# Patient Record
Sex: Female | Born: 1955
Health system: Southern US, Community
[De-identification: ages and names within clinical notes are randomized; demographics above are authoritative.]

## PROBLEM LIST (undated history)

## (undated) DIAGNOSIS — G4730 Sleep apnea, unspecified: Secondary | ICD-10-CM

## (undated) DIAGNOSIS — N189 Chronic kidney disease, unspecified: Secondary | ICD-10-CM

## (undated) DIAGNOSIS — G709 Myoneural disorder, unspecified: Secondary | ICD-10-CM

## (undated) DIAGNOSIS — T7840XA Allergy, unspecified, initial encounter: Secondary | ICD-10-CM

## (undated) DIAGNOSIS — J309 Allergic rhinitis, unspecified: Secondary | ICD-10-CM

## (undated) DIAGNOSIS — I10 Essential (primary) hypertension: Secondary | ICD-10-CM

## (undated) DIAGNOSIS — F419 Anxiety disorder, unspecified: Secondary | ICD-10-CM

## (undated) DIAGNOSIS — F32A Depression, unspecified: Secondary | ICD-10-CM

## (undated) DIAGNOSIS — L409 Psoriasis, unspecified: Secondary | ICD-10-CM

## (undated) DIAGNOSIS — M1990 Unspecified osteoarthritis, unspecified site: Secondary | ICD-10-CM

## (undated) DIAGNOSIS — E119 Type 2 diabetes mellitus without complications: Secondary | ICD-10-CM

## (undated) DIAGNOSIS — E559 Vitamin D deficiency, unspecified: Secondary | ICD-10-CM

## (undated) DIAGNOSIS — E785 Hyperlipidemia, unspecified: Secondary | ICD-10-CM

## (undated) HISTORY — DX: Essential (primary) hypertension: I10

## (undated) HISTORY — DX: Allergic rhinitis, unspecified: J30.9

## (undated) HISTORY — DX: Psoriasis, unspecified: L40.9

## (undated) HISTORY — DX: Unspecified osteoarthritis, unspecified site: M19.90

## (undated) HISTORY — DX: Allergy, unspecified, initial encounter: T78.40XA

## (undated) HISTORY — DX: Anxiety disorder, unspecified: F41.9

## (undated) HISTORY — DX: Type 2 diabetes mellitus without complications: E11.9

## (undated) HISTORY — PX: TONSILLECTOMY: SUR1361

## (undated) HISTORY — DX: Hyperlipidemia, unspecified: E78.5

## (undated) HISTORY — DX: Vitamin D deficiency, unspecified: E55.9

## (undated) HISTORY — DX: Depression, unspecified: F32.A

## (undated) HISTORY — DX: Chronic kidney disease, unspecified: N18.9

## (undated) HISTORY — DX: Myoneural disorder, unspecified: G70.9

## (undated) HISTORY — DX: Sleep apnea, unspecified: G47.30

## (undated) HISTORY — PX: APPENDECTOMY: SHX54

---

## 2015-12-19 DIAGNOSIS — T783XXA Angioneurotic edema, initial encounter: Secondary | ICD-10-CM | POA: Insufficient documentation

## 2015-12-19 DIAGNOSIS — G4733 Obstructive sleep apnea (adult) (pediatric): Secondary | ICD-10-CM | POA: Insufficient documentation

## 2015-12-19 DIAGNOSIS — I10 Essential (primary) hypertension: Secondary | ICD-10-CM

## 2015-12-19 DIAGNOSIS — E119 Type 2 diabetes mellitus without complications: Secondary | ICD-10-CM | POA: Insufficient documentation

## 2015-12-19 DIAGNOSIS — E1159 Type 2 diabetes mellitus with other circulatory complications: Secondary | ICD-10-CM | POA: Insufficient documentation

## 2015-12-19 DIAGNOSIS — Z9989 Dependence on other enabling machines and devices: Secondary | ICD-10-CM

## 2015-12-19 DIAGNOSIS — T464X5A Adverse effect of angiotensin-converting-enzyme inhibitors, initial encounter: Secondary | ICD-10-CM | POA: Insufficient documentation

## 2017-10-07 ENCOUNTER — Encounter: Payer: Self-pay | Admitting: Pulmonary Disease

## 2017-10-07 ENCOUNTER — Ambulatory Visit (INDEPENDENT_AMBULATORY_CARE_PROVIDER_SITE_OTHER): Payer: BLUE CROSS/BLUE SHIELD | Admitting: Pulmonary Disease

## 2017-10-07 VITALS — BP 126/72 | HR 88 | Ht 63.0 in | Wt 181.0 lb

## 2017-10-07 DIAGNOSIS — G4733 Obstructive sleep apnea (adult) (pediatric): Secondary | ICD-10-CM

## 2017-10-07 DIAGNOSIS — Z9989 Dependence on other enabling machines and devices: Secondary | ICD-10-CM | POA: Diagnosis not present

## 2017-10-07 NOTE — Progress Notes (Signed)
   Subjective:    Patient ID: Adrienne Davidson, female    DOB: 1955/07/01, 62 y.o.   MRN: [id number]  HPI    Review of Systems  Constitutional: Negative for fever and unexpected weight change.  HENT: Positive for congestion, sinus pressure and sneezing. Negative for dental problem, ear pain, nosebleeds, postnasal drip, rhinorrhea, sore throat and trouble swallowing.   Eyes: Negative for redness and itching.  Respiratory: Positive for cough and shortness of breath. Negative for wheezing.   Cardiovascular: Negative for palpitations and leg swelling.  Gastrointestinal: Negative for nausea and vomiting.  Genitourinary: Negative for dysuria.  Musculoskeletal: Negative for joint swelling.  Skin: Positive for rash.  Allergic/Immunologic: Negative.  Negative for environmental allergies, food allergies and immunocompromised state.  Neurological: Negative for headaches.  Hematological: Does not bruise/bleed easily.  Psychiatric/Behavioral: Negative for dysphoric mood. The patient is not nervous/anxious.        Objective:   Physical Exam        Assessment & Plan:

## 2017-10-07 NOTE — Progress Notes (Signed)
[facility]  Chief Complaint  Patient presents with  . sleep consult    Pt is self referral, needs to be re-established. Pt's last Sleep Study was in 2008 through [facility].  Pt is using CPAP machine, and would like to change DME to [facility]- this is were her husband is established. Pt is requesting new cpap machine, it has been over 64 yrs old.    Vital signs: BP 126/72 (BP Location: Left Arm, Cuff Size: Normal)   Pulse 88   Ht 5\' 3"  (1.6 m)   Wt 181 lb (82.1 kg)   SpO2 99%   BMI 32.06 kg/m   History of Present Illnes Adrienne Davidson is a 62 y.o. female for evaluation of sleep problems.  She had a sleep study in 2008.  Has been on same CPAP since.  Needs new machine.  Uses [facility].  Snores, and stops breathing when CPAP doesn't work.  Also very restless.  She goes to sleep at 10 pm.  She falls asleep in 20 minutes.  She wakes up several times to use the bathroom.  She gets out of bed at 7 am.  She feels tired in the morning if her CPAP doesn't work.  She denies morning headache.  She does not use anything to help her fall sleep or stay awake.  She denies sleep walking, sleep talking, bruxism, or nightmares.  There is no history of restless legs.  She denies sleep hallucinations, sleep paralysis, or cataplexy.  The Epworth score is 8 out of 24.    Physical Exam:  General - pleasant Eyes - pupils reactive, wears glasses ENT - no sinus tenderness, no oral exudate, no LAN Cardiac - regular, no murmur Chest - no wheeze, rales Abd - soft, non tender Ext - no edema Skin - no rashes Neuro - normal strength Psych - normal mood   Assessment/Plan:  Obstructive sleep apnea. - will arrange for new auto CPAP - will get copy of her old sleep study - explained insurance might require her to do new sleep study  Tobacco abuse. - encouraged her to keep up with her smoking cessation efforts   Patient Instructions  Will try  to get a copy of your sleep study from [facility]  Will arrange for new auto CPAP machine  Follow up in 2 months    [name], MD [place] 10/07/2017, 12:49 PM  Flow Sheet  Sleep tests:  Review of Systems: Constitutional: Negative for fever and unexpected weight change.  HENT: Positive for congestion, sinus pressure and sneezing. Negative for dental problem, ear pain, nosebleeds, postnasal drip, rhinorrhea, sore throat and trouble swallowing.   Eyes: Negative for redness and itching.  Respiratory: Positive for cough and shortness of breath. Negative for wheezing.   Cardiovascular: Negative for palpitations and leg swelling.  Gastrointestinal: Negative for nausea and vomiting.  Genitourinary: Negative for dysuria.  Musculoskeletal: Negative for joint swelling.  Skin: Positive for rash.  Allergic/Immunologic: Negative.  Negative for environmental allergies, food allergies and immunocompromised state.  Neurological: Negative for headaches.  Hematological: Does not bruise/bleed easily.  Psychiatric/Behavioral: Negative for dysphoric mood. The patient is not nervous/anxious.    Past Medical History: She  has a past medical history of Allergic rhinitis, Diabetes mellitus ([place]), Hyperlipidemia, Hypertension, and Sleep apnea.  Past Surgical History: She  has a past surgical history that includes Appendectomy and Tonsillectomy.  Family History: Her family history includes Heart disease in  her father.  Social History: She  reports that she has been smoking cigarettes.  She has a 25.00 pack-year smoking history. She has never used smokeless tobacco. She reports that she does not drink alcohol or use drugs.  Medications: Allergies as of 10/07/2017      Reactions   Enalapril       Medication List        Accurate as of 10/07/17 12:49 PM. Always use your most recent med list.          ASPIRIN 81 81 MG EC tablet Generic drug:  aspirin Take 81  mg by mouth daily. Swallow whole.   CENTRUM ADULTS Tabs Take by mouth.   Cranberry 250 MG Caps Take by mouth.   gemfibrozil 600 MG tablet Commonly known as:  LOPID Take 600 mg by mouth 2 (two) times daily before a meal.   Krill Oil 500 MG Caps Take by mouth.   LOSARTAN POTASSIUM PO Take by mouth.   metFORMIN 1000 MG tablet Commonly known as:  GLUCOPHAGE Take 1,000 mg by mouth 2 (two) times daily with a meal.   rosuvastatin 5 MG tablet Commonly known as:  CRESTOR Take 5 mg by mouth daily.   sitaGLIPtin 25 MG tablet Commonly known as:  JANUVIA Take 25 mg by mouth daily.

## 2017-10-07 NOTE — Patient Instructions (Signed)
Will try to get a copy of your sleep study from [facility]  Will arrange for new auto CPAP machine  Follow up in 2 months

## 2017-10-28 ENCOUNTER — Other Ambulatory Visit: Payer: Self-pay | Admitting: Pulmonary Disease

## 2017-10-28 DIAGNOSIS — G4733 Obstructive sleep apnea (adult) (pediatric): Secondary | ICD-10-CM

## 2017-10-28 DIAGNOSIS — Z9989 Dependence on other enabling machines and devices: Principal | ICD-10-CM

## 2017-12-09 ENCOUNTER — Ambulatory Visit: Payer: BLUE CROSS/BLUE SHIELD | Admitting: Pulmonary Disease

## 2017-12-20 ENCOUNTER — Ambulatory Visit (INDEPENDENT_AMBULATORY_CARE_PROVIDER_SITE_OTHER): Payer: BLUE CROSS/BLUE SHIELD | Admitting: Pulmonary Disease

## 2017-12-20 ENCOUNTER — Encounter: Payer: Self-pay | Admitting: Pulmonary Disease

## 2017-12-20 VITALS — BP 136/74 | HR 104 | Ht 63.0 in | Wt 175.0 lb

## 2017-12-20 DIAGNOSIS — Z23 Encounter for immunization: Secondary | ICD-10-CM

## 2017-12-20 DIAGNOSIS — G4733 Obstructive sleep apnea (adult) (pediatric): Secondary | ICD-10-CM | POA: Diagnosis not present

## 2017-12-20 DIAGNOSIS — Z9989 Dependence on other enabling machines and devices: Secondary | ICD-10-CM

## 2017-12-20 NOTE — Progress Notes (Signed)
[facility]  Chief Complaint  Patient presents with  . Follow-up    pt states she is wearing cpap avg 9hr nightly- feels pressure and mask is okay. DME:[facility]    Constitutional: BP 136/74 (BP Location: Left Arm, Cuff Size: Normal)   Pulse (!) 104   Ht 5\' 3"  (1.6 m)   Wt 175 lb (79.4 kg)   SpO2 98%   BMI 31.00 kg/m   History of Present Illnes Adrienne Davidson is a 62 y.o. female with obstructive sleep apnea.  She got new CPAP since last visit.  This is working well.  She is surprised how much quieter her new machine is.  No issue with mask fit or pressure.  Denies sinus congestion, sore throat, dry mouth, or aerophagia.  Feels rested during the day.  Comprehensive Respiratory Exam:  Appearance - well kempt  ENMT - nasal mucosa moist, turbinates clear, midline nasal septum, no dental lesions, no gingival bleeding, no oral exudates, no tonsillar hypertrophy Neck - no masses, trachea midline, no thyromegaly, no elevation in JVP Respiratory - normal appearance of chest wall, normal respiratory effort w/o accessory muscle use, no wheezing or rales CV - s1s2 regular rate and rhythm, no murmurs, no peripheral edema GI - soft, non tender Lymph - no adenopathy noted in neck and axillary areas MSK - normal muscle strength and tone, normal gait Ext - no cyanosis, clubbing, or joint inflammation noted Skin - no rashes, lesions, or ulcers Neuro - oriented to person, place, and time Psych - normal mood and affect   Assessment/Plan:  Obstructive sleep apnea. - she is compliant with CPAP and reports benefit from therapy - continue auto CPAP  Tobacco abuse. - reviewed options to assist with smoking cessation - flu shot today   Patient Instructions  Flu shot today  Follow up in 1 year    [name], MD [place] 12/20/2017, 12:17 PM  Flow Sheet  Sleep tests: Auto CPAP 11/19/17 to 12/18/17 >> used on 30 of 30 nights  with average 9 hrs 8 min.  Average AHI 1.1 with median CPAP 8 and 95 th percentile CPAP 12 cm H2O.  Past Medical History: She  has a past medical history of Allergic rhinitis, Diabetes mellitus ([place]), Hyperlipidemia, Hypertension, and Sleep apnea.  Past Surgical History: She  has a past surgical history that includes Appendectomy and Tonsillectomy.  Family History: Her family history includes Heart disease in her father.  Social History: She  reports that she has been smoking cigarettes. She has a 25.00 pack-year smoking history. She has never used smokeless tobacco. She reports that she does not drink alcohol or use drugs.  Medications: Allergies as of 12/20/2017      Reactions   Enalapril       Medication List        Accurate as of 12/20/17 12:17 PM. Always use your most recent med list.          ASPIRIN 81 81 MG EC tablet Generic drug:  aspirin Take 81 mg by mouth daily. Swallow whole.   CENTRUM ADULTS Tabs Take by mouth.   Cranberry 250 MG Caps Take by mouth.   gemfibrozil 600 MG tablet Commonly known as:  LOPID Take 600 mg by mouth 2 (two) times daily before a meal.   Krill Oil 500 MG Caps Take by mouth.   LOSARTAN POTASSIUM PO Take by mouth.   metFORMIN 1000 MG tablet Commonly known as:  GLUCOPHAGE  Take 1,000 mg by mouth 2 (two) times daily with a meal.   rosuvastatin 5 MG tablet Commonly known as:  CRESTOR Take 5 mg by mouth daily.   sitaGLIPtin 25 MG tablet Commonly known as:  JANUVIA Take 25 mg by mouth daily.

## 2017-12-20 NOTE — Patient Instructions (Signed)
Flu shot today Follow up in 1 year 

## 2018-03-28 ENCOUNTER — Encounter: Payer: Self-pay | Admitting: Pediatrics

## 2018-03-28 ENCOUNTER — Ambulatory Visit: Payer: BLUE CROSS/BLUE SHIELD | Admitting: Pediatrics

## 2018-03-28 VITALS — BP 110/64 | HR 84 | Temp 97.7°F | Ht 63.0 in | Wt 171.6 lb

## 2018-03-28 DIAGNOSIS — E559 Vitamin D deficiency, unspecified: Secondary | ICD-10-CM

## 2018-03-28 DIAGNOSIS — E119 Type 2 diabetes mellitus without complications: Secondary | ICD-10-CM | POA: Diagnosis not present

## 2018-03-28 DIAGNOSIS — G4733 Obstructive sleep apnea (adult) (pediatric): Secondary | ICD-10-CM

## 2018-03-28 DIAGNOSIS — E7849 Other hyperlipidemia: Secondary | ICD-10-CM

## 2018-03-28 DIAGNOSIS — I10 Essential (primary) hypertension: Secondary | ICD-10-CM | POA: Diagnosis not present

## 2018-03-28 DIAGNOSIS — E785 Hyperlipidemia, unspecified: Secondary | ICD-10-CM

## 2018-03-28 DIAGNOSIS — Z1159 Encounter for screening for other viral diseases: Secondary | ICD-10-CM | POA: Diagnosis not present

## 2018-03-28 DIAGNOSIS — E1169 Type 2 diabetes mellitus with other specified complication: Secondary | ICD-10-CM

## 2018-03-28 DIAGNOSIS — L409 Psoriasis, unspecified: Secondary | ICD-10-CM

## 2018-03-28 LAB — BAYER DCA HB A1C WAIVED: HB A1C (BAYER DCA - WAIVED): 6.5 % (ref <7.0)

## 2018-03-28 MED ORDER — METFORMIN HCL 1000 MG PO TABS: 1000.0000 mg | ORAL_TABLET | Freq: Two times a day (BID) | ORAL | 1 refills | Status: DC

## 2018-03-28 MED ORDER — GEMFIBROZIL 600 MG PO TABS: 600.0000 mg | ORAL_TABLET | Freq: Two times a day (BID) | ORAL | 1 refills | Status: DC

## 2018-03-28 MED ORDER — ROSUVASTATIN CALCIUM 5 MG PO TABS: 5.0000 mg | ORAL_TABLET | Freq: Every day | ORAL | 1 refills | Status: DC

## 2018-03-28 MED ORDER — LIRAGLUTIDE 18 MG/3ML ~~LOC~~ SOPN: 1.2000 mg | PEN_INJECTOR | Freq: Every day | SUBCUTANEOUS | 1 refills | Status: DC

## 2018-03-28 MED ORDER — AMLODIPINE BESYLATE 5 MG PO TABS: 5.0000 mg | ORAL_TABLET | Freq: Every day | ORAL | 3 refills | Status: DC

## 2018-03-28 NOTE — Patient Instructions (Signed)
Let me know if blood pressures regularly >140 on top or >90 on bottom.

## 2018-03-28 NOTE — Progress Notes (Signed)
Subjective:   Patient ID: Adrienne Davidson, female    DOB: 07-Oct-1955, 62 y.o.   MRN: [id number] CC: New Patient (Initial Visit) And follow-up multiple medical problems HPI: Adrienne Davidson is a 62 y.o. female  Here to establish care and follow-up multiple medical problems  Plaque psoriasis: on apremilast, followed by dermatology.   Diabetes: Currently on Januvia, metformin, Victoza.  Victoza was recently started.  She says diabetes has recently been fairly well controlled overall.  She is trying to avoid sugary foods.  Hyperlipidemia: Taking rosuvastatin and gemfibrozil.  Obstructive sleep apnea: Using CPAP regularly.  Hypertension: Was on an ACE inhibitor, had angioedema, was seen in the emergency room about 2 years ago.  She was switched to losartan.  Has not had any problems with the losartan yet.  Was on the ACE inhibitor first years prior to her angioedema reaction.  Does not have any other food allergies.  Vitamin D deficiency: Has been taking 5000 IU daily for the last few months.  Past Medical History:  Diagnosis Date  . Allergic rhinitis   . Diabetes mellitus ([place])   . Hyperlipidemia   . Hypertension   . Psoriasis    Bilateral feet  . Sleep apnea   . Sleep apnea   . Vitamin D deficiency    Family History  Problem Relation Age of Onset  . Heart disease Father   . Non-Hodgkin's lymphoma Mother    Social History   Socioeconomic History  . Marital status: Married    Spouse name: [name]  . Number of children: 2  . Years of education: Not on file  . Highest education level: Not on file  Occupational History  . Not on file  Social Needs  . Financial resource strain: Not on file  . Food insecurity:    Worry: Not on file    Inability: Not on file  . Transportation needs:    Medical: Not on file    Non-medical: Not on file  Tobacco Use  . Smoking status: Current Every Day Smoker    Packs/day: 0.25    Years: 25.00    Pack years: 6.25    Types: Cigarettes  .  Smokeless tobacco: Never Used  Substance and Sexual Activity  . Alcohol use: Never    Frequency: Never  . Drug use: Never  . Sexual activity: Not on file  Lifestyle  . Physical activity:    Days per week: Not on file    Minutes per session: Not on file  . Stress: Not on file  Relationships  . Social connections:    Talks on phone: Not on file    Gets together: Not on file    Attends religious service: Not on file    Active member of club or organization: Not on file    Attends meetings of clubs or organizations: Not on file    Relationship status: Not on file  Other Topics Concern  . Not on file  Social History Narrative  . Not on file   ROS: All systems negative other than what is in HPI  Objective:    BP 110/64   Pulse 84   Temp 97.7 F (36.5 C) (Oral)   Ht _0  (1.6 m)   Wt 171 lb 9.6 oz (77.8 kg)   BMI 30.40 kg/m   Wt Readings from Last 3 Encounters:  03/28/18 171 lb 9.6 oz (77.8 kg)  12/20/17 175 lb (79.4 kg)  10/07/17 181 lb (82.1 kg)  Gen: NAD, alert, cooperative with exam, NCAT EYES: EOMI, no conjunctival injection, or no icterus ENT:  TMs pearly gray b/l, OP without erythema LYMPH: no cervical LAD CV: NRRR, normal S1/S2, no murmur, distal pulses 2+ b/l Resp: CTABL, no wheezes, normal WOB Abd: +BS, soft, NTND. no guarding or organomegaly Ext: No edema, warm Neuro: Alert and oriented, strength equal b/l UE and LE, coordination grossly normal MSK: normal muscle bulk  Assessment & Plan:  Adrienne Davidson was seen today for new patient (initial visit), and follow-up multiple medical problems.  Diagnoses and all orders for this visit:  Diabetes mellitus without complication (HCC) O0B 6.5 today.  On GLP-1 agonist and DPP 4 inhibitor, will stop Januvia.  Continue Victoza, metformin. -     CMP14+EGFR -     Bayer DCA Hb A1c Waived -     liraglutide (VICTOZA) 18 MG/3ML SOPN; Inject 0.2 mLs (1.2 mg total) into the skin daily. -     metFORMIN (GLUCOPHAGE) 1000 MG  tablet; Take 1 tablet (1,000 mg total) by mouth 2 (two) times daily with a meal.  Essential hypertension Well-controlled today.  Olmesartan alone.  History of angioedema with ACE inhibitor.  Will switch to amlodipine. -     CMP14+EGFR -     amLODipine (NORVASC) 5 MG tablet; Take 1 tablet (5 mg total) by mouth daily.  OSA (obstructive sleep apnea) Stable, following with pulmonology.  Wearing CPAP regularly  Encounter for hepatitis C screening test for low risk patient -     Hepatitis C antibody  Psoriasis Well-controlled on apremilast.  Following with dermatology.  Vitamin D deficiency Continue vitamin D replacement, will check levels as she is on high-dose vitamin D. -     VITAMIN D 25 Hydroxy (Vit-D Deficiency, Fractures)  Hyperlipidemia associated with type 2 diabetes mellitus ([place]) Stable, continue current medicines. -     Lipid panel -     gemfibrozil (LOPID) 600 MG tablet; Take 1 tablet (600 mg total) by mouth 2 (two) times daily before a meal. -     rosuvastatin (CRESTOR) 5 MG tablet; Take 1 tablet (5 mg total) by mouth daily.   Follow up plan: Return in about 4 weeks (around 04/25/2018) for for well visit and BP check. Assunta Found, MD [place]

## 2018-03-29 LAB — LIPID PANEL
CHOLESTEROL TOTAL: 176 mg/dL (ref 100–199)
Chol/HDL Ratio: 3 ratio (ref 0.0–4.4)
HDL: 58 mg/dL (ref >39)
LDL Calculated: 100 mg/dL — ABNORMAL HIGH (ref 0–99)
Triglycerides: 90 mg/dL (ref 0–149)
VLDL CHOLESTEROL CAL: 18 mg/dL (ref 5–40)

## 2018-03-29 LAB — CMP14+EGFR
A/G RATIO: 2 (ref 1.2–2.2)
ALBUMIN: 4.6 g/dL (ref 3.6–4.8)
ALT: 9 IU/L (ref 0–32)
AST: 11 IU/L (ref 0–40)
Alkaline Phosphatase: 85 IU/L (ref 39–117)
BUN/Creatinine Ratio: 23 (ref 12–28)
BUN: 23 mg/dL (ref 8–27)
CALCIUM: 10.8 mg/dL — AB (ref 8.7–10.3)
CHLORIDE: 105 mmol/L (ref 96–106)
CO2: 19 mmol/L — ABNORMAL LOW (ref 20–29)
Creatinine, Ser: 1.02 mg/dL — ABNORMAL HIGH (ref 0.57–1.00)
GFR calc Af Amer: 67 mL/min/{1.73_m2} (ref >59)
GFR calc non Af Amer: 58 mL/min/{1.73_m2} — ABNORMAL LOW (ref >59)
GLUCOSE: 106 mg/dL — AB (ref 65–99)
Globulin, Total: 2.3 g/dL (ref 1.5–4.5)
Potassium: 5 mmol/L (ref 3.5–5.2)
Sodium: 144 mmol/L (ref 134–144)
TOTAL PROTEIN: 6.9 g/dL (ref 6.0–8.5)

## 2018-03-29 LAB — VITAMIN D 25 HYDROXY (VIT D DEFICIENCY, FRACTURES): Vit D, 25-Hydroxy: 65.4 ng/mL (ref 30.0–100.0)

## 2018-03-29 LAB — HEPATITIS C ANTIBODY: Hep C Virus Ab: <0.1 s/co ratio (ref 0.0–0.9)

## 2018-04-28 ENCOUNTER — Ambulatory Visit (INDEPENDENT_AMBULATORY_CARE_PROVIDER_SITE_OTHER): Payer: BLUE CROSS/BLUE SHIELD | Admitting: Pediatrics

## 2018-04-28 ENCOUNTER — Encounter: Payer: Self-pay | Admitting: Pediatrics

## 2018-04-28 VITALS — BP 131/78 | HR 90 | Temp 98.7°F | Ht 63.0 in | Wt 175.4 lb

## 2018-04-28 DIAGNOSIS — Z0000 Encounter for general adult medical examination without abnormal findings: Secondary | ICD-10-CM | POA: Diagnosis not present

## 2018-04-28 DIAGNOSIS — E119 Type 2 diabetes mellitus without complications: Secondary | ICD-10-CM

## 2018-04-28 NOTE — Progress Notes (Signed)
  Subjective:   Patient ID: Adrienne Davidson, female    DOB: 18-May-1955, 63 y.o.   MRN: [id number] CC: Annual Exam and Blood Pressure Check  HPI: Adrienne Davidson is a 63 y.o. female   Mammogram: normal mid December  Pap smear: due  Colonoscopy: Feb 2019, had 3 polyps. [facility] [place]  Diabetes: due for eye exam   Relevant past medical, surgical, family and social history reviewed. Allergies and medications reviewed and updated. Social History   Tobacco Use  Smoking Status Current Every Day Smoker  . Packs/day: 0.25  . Years: 25.00  . Pack years: 6.25  . Types: Cigarettes  Smokeless Tobacco Never Used   ROS: All systems negative other than what is in the HPI.  Objective:    BP 131/78   Pulse 90   Temp 98.7 F (37.1 C) (Oral)   Ht 5\' 3"  (1.6 m)   Wt 175 lb 6.4 oz (79.6 kg)   BMI 31.07 kg/m   Wt Readings from Last 3 Encounters:  04/28/18 175 lb 6.4 oz (79.6 kg)  03/28/18 171 lb 9.6 oz (77.8 kg)  12/20/17 175 lb (79.4 kg)    Gen: NAD, alert, cooperative with exam, NCAT EYES: EOMI, no conjunctival injection, or no icterus ENT:  TMs pearly gray b/l, OP without erythema LYMPH: no cervical LAD CV: NRRR, normal S1/S2, no murmur, distal pulses 2+ b/l Resp: CTABL, no wheezes, normal WOB Abd: +BS, soft, NTND. no guarding or organomegaly Ext: No edema, warm Neuro: Alert and oriented, strength equal b/l UE and LE, coordination grossly normal MSK: normal muscle bulk Breast: Normal exam bilaterally GU: Normal external female genitalia, normal-appearing cervix.  Assessment & Plan:  Adrienne Davidson was seen today for annual exam and blood pressure check.  Diagnoses and all orders for this visit:  Encounter for preventive care Continue to live active lifestyle, increasing fruits and vegetables in the diet, minimizing simple carbohydrates and sugary drinks. -     Pap IG and HPV (high risk) DNA detection  Diabetes mellitus without complication (HCC) -      Microalbumin / creatinine urine ratio   Follow up plan: Return in about 6 months (around 10/27/2018). Assunta Found, MD [place]

## 2018-04-29 LAB — MICROALBUMIN / CREATININE URINE RATIO
Creatinine, Urine: 61.1 mg/dL
Microalb/Creat Ratio: 320.8 mg/g creat — ABNORMAL HIGH (ref 0.0–30.0)
Microalbumin, Urine: 196 ug/mL

## 2018-04-30 LAB — PAP IG AND HPV HIGH-RISK: HPV, HIGH-RISK: NEGATIVE

## 2018-08-14 DIAGNOSIS — G4733 Obstructive sleep apnea (adult) (pediatric): Secondary | ICD-10-CM | POA: Diagnosis not present

## 2018-08-25 DIAGNOSIS — G4733 Obstructive sleep apnea (adult) (pediatric): Secondary | ICD-10-CM | POA: Diagnosis not present

## 2018-08-29 ENCOUNTER — Other Ambulatory Visit: Payer: Self-pay | Admitting: Pediatrics

## 2018-08-29 DIAGNOSIS — E1169 Type 2 diabetes mellitus with other specified complication: Secondary | ICD-10-CM

## 2018-08-29 DIAGNOSIS — E785 Hyperlipidemia, unspecified: Secondary | ICD-10-CM

## 2018-09-10 DIAGNOSIS — L400 Psoriasis vulgaris: Secondary | ICD-10-CM | POA: Diagnosis not present

## 2018-09-12 ENCOUNTER — Other Ambulatory Visit: Payer: Self-pay | Admitting: Family Medicine

## 2018-09-12 DIAGNOSIS — E1169 Type 2 diabetes mellitus with other specified complication: Secondary | ICD-10-CM

## 2018-09-12 DIAGNOSIS — E785 Hyperlipidemia, unspecified: Secondary | ICD-10-CM

## 2018-09-13 DIAGNOSIS — G4733 Obstructive sleep apnea (adult) (pediatric): Secondary | ICD-10-CM | POA: Diagnosis not present

## 2018-10-14 DIAGNOSIS — G4733 Obstructive sleep apnea (adult) (pediatric): Secondary | ICD-10-CM | POA: Diagnosis not present

## 2018-10-22 DIAGNOSIS — L400 Psoriasis vulgaris: Secondary | ICD-10-CM | POA: Diagnosis not present

## 2018-10-24 ENCOUNTER — Other Ambulatory Visit: Payer: Self-pay

## 2018-10-27 ENCOUNTER — Ambulatory Visit (INDEPENDENT_AMBULATORY_CARE_PROVIDER_SITE_OTHER): Payer: Medicare Other | Admitting: Family Medicine

## 2018-10-27 ENCOUNTER — Other Ambulatory Visit: Payer: Self-pay

## 2018-10-27 ENCOUNTER — Encounter: Payer: Self-pay | Admitting: Family Medicine

## 2018-10-27 VITALS — BP 136/72 | HR 78 | Temp 97.8°F | Ht 63.0 in | Wt 186.0 lb

## 2018-10-27 DIAGNOSIS — E1122 Type 2 diabetes mellitus with diabetic chronic kidney disease: Secondary | ICD-10-CM

## 2018-10-27 DIAGNOSIS — Z23 Encounter for immunization: Secondary | ICD-10-CM | POA: Diagnosis not present

## 2018-10-27 DIAGNOSIS — E785 Hyperlipidemia, unspecified: Secondary | ICD-10-CM

## 2018-10-27 DIAGNOSIS — E1159 Type 2 diabetes mellitus with other circulatory complications: Secondary | ICD-10-CM | POA: Diagnosis not present

## 2018-10-27 DIAGNOSIS — E1169 Type 2 diabetes mellitus with other specified complication: Secondary | ICD-10-CM | POA: Diagnosis not present

## 2018-10-27 DIAGNOSIS — I10 Essential (primary) hypertension: Secondary | ICD-10-CM

## 2018-10-27 DIAGNOSIS — N183 Chronic kidney disease, stage 3 (moderate): Secondary | ICD-10-CM

## 2018-10-27 LAB — BAYER DCA HB A1C WAIVED: HB A1C (BAYER DCA - WAIVED): 6.6 % (ref <7.0)

## 2018-10-27 MED ORDER — AMLODIPINE BESYLATE 5 MG PO TABS: 5.0000 mg | ORAL_TABLET | Freq: Every day | ORAL | 3 refills | Status: DC

## 2018-10-27 MED ORDER — ROSUVASTATIN CALCIUM 5 MG PO TABS: ORAL_TABLET | ORAL | 0 refills | Status: DC

## 2018-10-27 MED ORDER — METFORMIN HCL 1000 MG PO TABS: 1000.0000 mg | ORAL_TABLET | Freq: Two times a day (BID) | ORAL | 3 refills | Status: DC

## 2018-10-27 MED ORDER — GEMFIBROZIL 600 MG PO TABS: 600.0000 mg | ORAL_TABLET | Freq: Two times a day (BID) | ORAL | 3 refills | Status: DC

## 2018-10-27 MED ORDER — LIRAGLUTIDE 18 MG/3ML ~~LOC~~ SOPN: 1.2000 mg | PEN_INJECTOR | Freq: Every day | SUBCUTANEOUS | 3 refills | Status: DC

## 2018-10-27 NOTE — Patient Instructions (Addendum)
Sugar looks great.  Stay active during these difficult times.  Have your eye exam results sent to me.

## 2018-10-27 NOTE — Addendum Note (Signed)
Addended byFaylene Million C on: 10/27/2018 11:01 AM   Modules accepted: Orders

## 2018-10-27 NOTE — Progress Notes (Signed)
Subjective: CC: est care new doctor, DM PCP: Adrienne Norlander, DO XMI:WOEHO T [name] is a 63 y.o. female presenting to clinic today for:  1. Type 2 Diabetes with HTN, HLD:  Patient reports compliance w/ Metformin, Victoza, Crestor, gemfibrozil, Norvasc and a baby aspirin.  Last eye exam: needs, sees Dr Adrienne Davidson in [place] Last foot exam: needs Last A1c:  Lab Results  Component Value Date   HGBA1C 6.5 03/28/2018   Nephropathy screen indicated?: done 04/28/2018 POSITIVE (hx angioedema/ anaphylaxis to ACE-I) Last flu, zoster and/or pneumovax: PNA due Immunization History  Administered Date(s) Administered  . Influenza Split 02/06/2017  . Influenza,inj,Quad PF,6+ Mos 12/20/2017  . Pneumococcal Polysaccharide-23 12/20/2015    ROS: Denies chest pain, shortness of breath, lower extreme edema, dizziness.  No sensation changes in the feet.  No history of ulceration in the past.  No visual disturbance.  She notes she is being followed closely by Dr. Cletus Davidson in [place] for pressures in her eyes but is not on any eyedrops for glaucoma.    Allergies  Allergen Reactions  . Enalapril Anaphylaxis    Angioedema Aug 2018   Past Medical History:  Diagnosis Date  . Allergic rhinitis   . Diabetes mellitus ([place])   . Hyperlipidemia   . Hypertension   . Psoriasis    Bilateral feet  . Sleep apnea   . Sleep apnea   . Vitamin D deficiency     Current Outpatient Medications:  .  amLODipine (NORVASC) 5 MG tablet, Take 1 tablet (5 mg total) by mouth daily., Disp: 90 tablet, Rfl: 3 .  Apremilast (OTEZLA) 30 MG TABS, Take by mouth., Disp: , Rfl:  .  aspirin (ASPIRIN 81) 81 MG EC tablet, Take 81 mg by mouth daily. Swallow whole., Disp: , Rfl:  .  Coenzyme Q10 100 MG capsule, Take by mouth., Disp: , Rfl:  .  Cranberry 250 MG CAPS, Take by mouth., Disp: , Rfl:  .  gemfibrozil (LOPID) 600 MG tablet, Take 1 tablet (600 mg total) by mouth 2 (two) times daily before a meal. Needs to be seen, Disp: 60  tablet, Rfl: 0 .  Krill Oil 500 MG CAPS, Take by mouth., Disp: , Rfl:  .  liraglutide (VICTOZA) 18 MG/3ML SOPN, Inject 0.2 mLs (1.2 mg total) into the skin daily., Disp: 18 mL, Rfl: 1 .  metFORMIN (GLUCOPHAGE) 1000 MG tablet, Take 1 tablet (1,000 mg total) by mouth 2 (two) times daily with a meal., Disp: 180 tablet, Rfl: 1 .  Multiple Vitamins-Minerals (CENTRUM ADULTS) TABS, Take by mouth., Disp: , Rfl:  .  rosuvastatin (CRESTOR) 5 MG tablet, Take 1 tablet (5 mg) by MOUTH once daily, Disp: 90 tablet, Rfl: 0 Social History   Socioeconomic History  . Marital status: Married    Spouse name: Adrienne Davidson  . Number of children: 2  . Years of education: Not on file  . Highest education level: Not on file  Occupational History  . Not on file  Social Needs  . Financial resource strain: Not on file  . Food insecurity    Worry: Not on file    Inability: Not on file  . Transportation needs    Medical: Not on file    Non-medical: Not on file  Tobacco Use  . Smoking status: Former Smoker    Packs/day: 0.25    Years: 25.00    Pack years: 6.25    Types: Cigarettes    Quit date: 06/27/2018    Years  since quitting: 0.3  . Smokeless tobacco: Never Used  Substance and Sexual Activity  . Alcohol use: Never    Frequency: Never  . Drug use: Never  . Sexual activity: Not on file  Lifestyle  . Physical activity    Days per week: Not on file    Minutes per session: Not on file  . Stress: Not on file  Relationships  . Social Herbalist on phone: Not on file    Gets together: Not on file    Attends religious service: Not on file    Active member of club or organization: Not on file    Attends meetings of clubs or organizations: Not on file    Relationship status: Not on file  . Intimate partner violence    Fear of current or ex partner: Not on file    Emotionally abused: Not on file    Physically abused: Not on file    Forced sexual activity: Not on file  Other Topics Concern  . Not on  file  Social History Narrative  . Not on file   Family History  Problem Relation Age of Onset  . Heart disease Father   . Non-Hodgkin's lymphoma Mother     Objective: Office vital signs reviewed. BP 136/72   Pulse 78   Temp 97.8 F (36.6 C) (Oral)   Ht 5\' 3"  (1.6 m)   Wt 186 lb (84.4 kg)   BMI 32.95 kg/m   Physical Examination:  General: Awake, alert, well nourished, No acute distress HEENT: Normal, sclera white, MMM Cardio: regular rate and rhythm, S1S2 heard, no murmurs appreciated Pulm: clear to auscultation bilaterally, no wheezes, rhonchi or rales; normal work of breathing on room air Extremities: warm, well perfused, No edema, cyanosis or clubbing; +2 pulses bilaterally MSK: normal gait and station Skin: dry; intact; no rashes or lesions Neuro: see DM foot below  Diabetic Foot Exam - Simple   Simple Foot Form Diabetic Foot exam was performed with the following findings: Yes 10/27/2018  8:42 AM  Visual Inspection No deformities, no ulcerations, no other skin breakdown bilaterally: Yes Sensation Testing Intact to touch and monofilament testing bilaterally: Yes Pulse Check Posterior Tibialis and Dorsalis pulse intact bilaterally: Yes Comments Decreased vibratory sensation in the right foot.  This is intact on the left side.  No history of right foot or ankle fracture.  She does have a small protuberance in the tarsals noted dorsally.    Assessment/ Plan: 63 y.o. female   1. Type 2 diabetes mellitus with stage 3 chronic kidney disease, without long-term current use of insulin (HCC) Under excellent control with A1c of 6.6 today.  She is had a little bit of weight gain and I encouraged her to stay as active as possible.  She recently stopped smoking so I think this is probably contributing to the weight gain.  Continue Victoza and metformin.  Pneumococcal vaccination administered.  Shingrix also administered.  Follow-up in 6 months - Bayer DCA Hb A1c Waived -  liraglutide (VICTOZA) 18 MG/3ML SOPN; Inject 0.2 mLs (1.2 mg total) into the skin daily.  Dispense: 18 mL; Refill: 3 - metFORMIN (GLUCOPHAGE) 1000 MG tablet; Take 1 tablet (1,000 mg total) by mouth 2 (two) times daily with a meal.  Dispense: 180 tablet; Refill: 3  2. Hypertension associated with diabetes ([place]) Controlled.  Continue Norvasc - Basic Metabolic Panel - amLODipine (NORVASC) 5 MG tablet; Take 1 tablet (5 mg total) by mouth daily.  Dispense: 90 tablet; Refill: 3  3. Hyperlipidemia associated with type 2 diabetes mellitus ([place]) Plan for lipid panel at next visit. - gemfibrozil (LOPID) 600 MG tablet; Take 1 tablet (600 mg total) by mouth 2 (two) times daily before a meal.  Dispense: 180 tablet; Refill: 3 - rosuvastatin (CRESTOR) 5 MG tablet; Take 1 tablet (5 mg) by MOUTH once daily  Dispense: 90 tablet; Refill: 0   No orders of the defined types were placed in this encounter.  No orders of the defined types were placed in this encounter.    Adrienne Norlander, DO [place] [phone]

## 2018-10-28 LAB — BASIC METABOLIC PANEL
BUN/Creatinine Ratio: 23 (ref 12–28)
BUN: 22 mg/dL (ref 8–27)
CO2: 19 mmol/L — ABNORMAL LOW (ref 20–29)
Calcium: 10 mg/dL (ref 8.7–10.3)
Chloride: 105 mmol/L (ref 96–106)
Creatinine, Ser: 0.96 mg/dL (ref 0.57–1.00)
GFR calc Af Amer: 72 mL/min/{1.73_m2} (ref >59)
GFR calc non Af Amer: 62 mL/min/{1.73_m2} (ref >59)
Glucose: 153 mg/dL — ABNORMAL HIGH (ref 65–99)
Potassium: 4.8 mmol/L (ref 3.5–5.2)
Sodium: 143 mmol/L (ref 134–144)

## 2018-11-13 DIAGNOSIS — G4733 Obstructive sleep apnea (adult) (pediatric): Secondary | ICD-10-CM | POA: Diagnosis not present

## 2018-12-24 DIAGNOSIS — L403 Pustulosis palmaris et plantaris: Secondary | ICD-10-CM | POA: Diagnosis not present

## 2019-01-01 ENCOUNTER — Ambulatory Visit (INDEPENDENT_AMBULATORY_CARE_PROVIDER_SITE_OTHER): Payer: Medicare Other

## 2019-01-01 DIAGNOSIS — Z23 Encounter for immunization: Secondary | ICD-10-CM

## 2019-01-21 ENCOUNTER — Telehealth: Payer: Self-pay | Admitting: Family Medicine

## 2019-01-21 ENCOUNTER — Other Ambulatory Visit: Payer: Self-pay

## 2019-01-22 ENCOUNTER — Ambulatory Visit (INDEPENDENT_AMBULATORY_CARE_PROVIDER_SITE_OTHER): Payer: Medicare Other

## 2019-01-22 DIAGNOSIS — Z23 Encounter for immunization: Secondary | ICD-10-CM | POA: Diagnosis not present

## 2019-03-26 DIAGNOSIS — L403 Pustulosis palmaris et plantaris: Secondary | ICD-10-CM | POA: Diagnosis not present

## 2019-04-28 ENCOUNTER — Other Ambulatory Visit: Payer: Self-pay

## 2019-04-29 ENCOUNTER — Ambulatory Visit (INDEPENDENT_AMBULATORY_CARE_PROVIDER_SITE_OTHER): Payer: Medicare Other | Admitting: Family Medicine

## 2019-04-29 ENCOUNTER — Encounter: Payer: Self-pay | Admitting: Family Medicine

## 2019-04-29 ENCOUNTER — Other Ambulatory Visit: Payer: Self-pay | Admitting: Family Medicine

## 2019-04-29 VITALS — BP 132/73 | HR 84 | Temp 96.9°F | Ht 63.0 in | Wt 187.0 lb

## 2019-04-29 DIAGNOSIS — R809 Proteinuria, unspecified: Secondary | ICD-10-CM

## 2019-04-29 DIAGNOSIS — I10 Essential (primary) hypertension: Secondary | ICD-10-CM | POA: Diagnosis not present

## 2019-04-29 DIAGNOSIS — Z87898 Personal history of other specified conditions: Secondary | ICD-10-CM

## 2019-04-29 DIAGNOSIS — E785 Hyperlipidemia, unspecified: Secondary | ICD-10-CM

## 2019-04-29 DIAGNOSIS — E1169 Type 2 diabetes mellitus with other specified complication: Secondary | ICD-10-CM | POA: Diagnosis not present

## 2019-04-29 DIAGNOSIS — E1121 Type 2 diabetes mellitus with diabetic nephropathy: Secondary | ICD-10-CM | POA: Diagnosis not present

## 2019-04-29 DIAGNOSIS — E1165 Type 2 diabetes mellitus with hyperglycemia: Secondary | ICD-10-CM

## 2019-04-29 DIAGNOSIS — E1129 Type 2 diabetes mellitus with other diabetic kidney complication: Secondary | ICD-10-CM | POA: Diagnosis not present

## 2019-04-29 DIAGNOSIS — E1159 Type 2 diabetes mellitus with other circulatory complications: Secondary | ICD-10-CM | POA: Diagnosis not present

## 2019-04-29 LAB — BAYER DCA HB A1C WAIVED: HB A1C (BAYER DCA - WAIVED): 7.6 % — ABNORMAL HIGH (ref <7.0)

## 2019-04-29 MED ORDER — OZEMPIC (0.25 OR 0.5 MG/DOSE) 2 MG/1.5ML ~~LOC~~ SOPN: PEN_INJECTOR | SUBCUTANEOUS | 0 refills | Status: AC

## 2019-04-29 MED ORDER — EPINEPHRINE 0.3 MG/0.3ML IJ SOAJ: 0.3000 mg | INTRAMUSCULAR | 0 refills | Status: DC | PRN

## 2019-04-29 NOTE — Patient Instructions (Signed)
Schedule your mammogram  We are trialing Ozempic today.  As we discussed, you can use up what you got left of the Victoza and do a 24-hour washout, meaning that you skip an entire day of injection.  Then you may start the Ozempic at 0.25 mg injected subcutaneously as you were doing the Victoza but only doing once per every 7 days.  Ozempic Trulicity [employer]

## 2019-04-29 NOTE — Progress Notes (Signed)
Subjective: CC: f/u  DM2, HTN, HLD PCP: Janora Norlander, DO LSL:HTDSK Adrienne Davidson is a 64 y.o. female presenting to clinic today for:  1. Type 2 Diabetes with HTN, HLD:  Patient reports compliance w/ Metformin, Victoza, Crestor, gemfibrozil, Norvasc and a baby aspirin.  She notes that she has forgotten the occasional Victoza and that her diet has not been very restrictive or balanced over the holidays.  She is also not getting much exercise.  She has not been compliant with checking her blood sugar routinely.  Though she does report that she has not had any symptomatic lows.  Last eye exam: needs, sees Dr Florina Ou in [place].  She is had some difficulty scheduling appointment due to COVID-19 Last foot exam: UTD Last A1c:  Lab Results  Component Value Date   HGBA1C 6.6 10/27/2018   Nephropathy screen indicated?: needs, 04/28/2018 POSITIVE (hx angioedema/ anaphylaxis to ACE-I) she needs an EpiPen to have on hand for this Last flu, zoster and/or pneumovax: Tetanus due but patient wishes to hold off due to price Immunization History  Administered Date(s) Administered  . Fluad Quad(high Dose 65+) 01/22/2019  . Influenza Split 02/06/2017  . Influenza,inj,Quad PF,6+ Mos 12/20/2017  . Pneumococcal Conjugate-13 10/27/2018  . Pneumococcal Polysaccharide-23 12/20/2015  . Zoster Recombinat (Shingrix) 10/27/2018, 01/01/2019    ROS: No chest pain, shortness of breath, edema, dizziness or falls.   Allergies  Allergen Reactions  . Enalapril Anaphylaxis    Angioedema Aug 2018   Past Medical History:  Diagnosis Date  . Allergic rhinitis   . Diabetes mellitus ([place])   . Hyperlipidemia   . Hypertension   . Psoriasis    Bilateral feet  . Sleep apnea   . Sleep apnea   . Vitamin D deficiency     Current Outpatient Medications:  .  amLODipine (NORVASC) 5 MG tablet, Take 1 tablet (5 mg total) by mouth daily., Disp: 90 tablet, Rfl: 3 .  aspirin (ASPIRIN 81) 81 MG EC tablet, Take 81 mg by  mouth daily. Swallow whole., Disp: , Rfl:  .  calcipotriene (DOVONOX) 0.005 % cream, Apply 1 application topically 2 (two) times daily., Disp: , Rfl:  .  Coenzyme Q10 100 MG capsule, Take by mouth., Disp: , Rfl:  .  Cranberry 250 MG CAPS, Take by mouth., Disp: , Rfl:  .  EPINEPHrine (EPIPEN IJ), Inject as directed., Disp: , Rfl:  .  gemfibrozil (LOPID) 600 MG tablet, Take 1 tablet (600 mg total) by mouth 2 (two) times daily before a meal., Disp: 180 tablet, Rfl: 3 .  Krill Oil 500 MG CAPS, Take by mouth., Disp: , Rfl:  .  liraglutide (VICTOZA) 18 MG/3ML SOPN, Inject 0.2 mLs (1.2 mg total) into the skin daily., Disp: 18 mL, Rfl: 3 .  metFORMIN (GLUCOPHAGE) 1000 MG tablet, Take 1 tablet (1,000 mg total) by mouth 2 (two) times daily with a meal., Disp: 180 tablet, Rfl: 3 .  Multiple Vitamins-Minerals (CENTRUM ADULTS) TABS, Take by mouth., Disp: , Rfl:  .  rosuvastatin (CRESTOR) 5 MG tablet, Take 1 tablet (5 mg) by MOUTH once daily, Disp: 90 tablet, Rfl: 0 Social History   Socioeconomic History  . Marital status: Married    Spouse name: [name]  . Number of children: 2  . Years of education: Not on file  . Highest education level: Not on file  Occupational History  . Not on file  Tobacco Use  . Smoking status: Former Smoker    Packs/day: 0.25  Years: 25.00    Pack years: 6.25    Types: Cigarettes    Quit date: 06/27/2018    Years since quitting: 0.8  . Smokeless tobacco: Never Used  Substance and Sexual Activity  . Alcohol use: Never  . Drug use: Never  . Sexual activity: Not on file  Other Topics Concern  . Not on file  Social History Narrative  . Not on file   Social Determinants of Health   Financial Resource Strain:   . Difficulty of Paying Living Expenses: Not on file  Food Insecurity:   . Worried About Charity fundraiser in the Last Year: Not on file  . Ran Out of Food in the Last Year: Not on file  Transportation Needs:   . Lack of Transportation (Medical): Not on  file  . Lack of Transportation (Non-Medical): Not on file  Physical Activity:   . Days of Exercise per Week: Not on file  . Minutes of Exercise per Session: Not on file  Stress:   . Feeling of Stress : Not on file  Social Connections:   . Frequency of Communication with Friends and Family: Not on file  . Frequency of Social Gatherings with Friends and Family: Not on file  . Attends Religious Services: Not on file  . Active Member of Clubs or Organizations: Not on file  . Attends Archivist Meetings: Not on file  . Marital Status: Not on file  Intimate Partner Violence:   . Fear of Current or Ex-Partner: Not on file  . Emotionally Abused: Not on file  . Physically Abused: Not on file  . Sexually Abused: Not on file   Objective: Office vital signs reviewed. BP 132/73   Pulse 84   Temp (!) 96.9 F (36.1 C) (Temporal)   Ht 5' 3"  (1.6 m)   Wt 187 lb (84.8 kg)   SpO2 97%   BMI 33.13 kg/m   Physical Examination:  General: Awake, alert, well nourished, No acute distress HEENT: Normal, sclera white, MMM Cardio: regular rate and rhythm, S1S2 heard, no murmurs appreciated Pulm: clear to auscultation bilaterally, no wheezes, rhonchi or rales; normal work of breathing on room air Extremities: warm, well perfused, No edema, cyanosis or clubbing; +2 pulses bilaterally MSK: normal gait and station1  Assessment/ Plan: 64 y.o. female   1. Uncontrolled type 2 diabetes mellitus with hyperglycemia (HCC) A1c up to 7.6 today.  This is quite a change from her last visit 6 months ago.  I do suspect this is likely due to noncompliance with medication and diet.  Because she is having difficulty remembering daily doses of Victoza we will try once weekly Ozempic.  A sample has been given instructions for initiation and use discussed with the patient.  I have recommended that she contact her insurance company to determine whether or not this medication will be affordable versus one of the  alternative once weekly as.  She will contact me in the next couple of weeks with this information and we will prescribe accordingly.  Plan for urine microalbumin.  She will return this well when she can.  She is already voided this morning. - Bayer DCA Hb A1c Waived - Microalbumin / creatinine urine ratio - Semaglutide,0.25 or 0.5MG/DOS, (OZEMPIC, 0.25 OR 0.5 MG/DOSE,) 2 MG/1.5ML SOPN; Inject 0.25 mg into the skin every 7 (seven) days for 14 days, THEN 0.5 mg every 7 (seven) days.  Dispense: 1 pen; Refill: 0  2. Hypertension associated with diabetes ([place])  Controlled.  Continue current regimen - CMP14+EGFR  3. Hyperlipidemia associated with type 2 diabetes mellitus (HCC) Continue statin - CMP14+EGFR - Lipid Panel  4. Microalbuminuria due to type 2 diabetes mellitus (HCC) Known microalbuminuria but is unable to tolerate ACE inhibitors or ARB use secondary to severe allergic reaction to lisinopril previously - Microalbumin / creatinine urine ratio  5. History of angioedema EpiPen sent. - EPINEPHrine (EPIPEN 2-PAK) 0.3 mg/0.3 mL IJ SOAJ injection; Inject 0.3 mLs (0.3 mg total) into the muscle as needed for anaphylaxis. Then call 911  Dispense: 1 each; Refill: 0    No orders of the defined types were placed in this encounter.  No orders of the defined types were placed in this encounter.    Janora Norlander, DO [place] [phone]

## 2019-04-30 LAB — CMP14+EGFR
ALT: 14 IU/L (ref 0–32)
AST: 19 IU/L (ref 0–40)
Albumin/Globulin Ratio: 2.5 — ABNORMAL HIGH (ref 1.2–2.2)
Albumin: 4.8 g/dL (ref 3.8–4.8)
Alkaline Phosphatase: 92 IU/L (ref 39–117)
BUN/Creatinine Ratio: 21 (ref 12–28)
BUN: 20 mg/dL (ref 8–27)
Bilirubin Total: 0.3 mg/dL (ref 0.0–1.2)
CO2: 21 mmol/L (ref 20–29)
Calcium: 10.8 mg/dL — ABNORMAL HIGH (ref 8.7–10.3)
Chloride: 101 mmol/L (ref 96–106)
Creatinine, Ser: 0.97 mg/dL (ref 0.57–1.00)
GFR calc Af Amer: 71 mL/min/{1.73_m2} (ref >59)
GFR calc non Af Amer: 61 mL/min/{1.73_m2} (ref >59)
Globulin, Total: 1.9 g/dL (ref 1.5–4.5)
Glucose: 172 mg/dL — ABNORMAL HIGH (ref 65–99)
Potassium: 4.9 mmol/L (ref 3.5–5.2)
Sodium: 140 mmol/L (ref 134–144)
Total Protein: 6.7 g/dL (ref 6.0–8.5)

## 2019-04-30 LAB — LIPID PANEL
Chol/HDL Ratio: 3.2 ratio (ref 0.0–4.4)
Cholesterol, Total: 194 mg/dL (ref 100–199)
HDL: 60 mg/dL (ref >39)
LDL Chol Calc (NIH): 118 mg/dL — ABNORMAL HIGH (ref 0–99)
Triglycerides: 86 mg/dL (ref 0–149)
VLDL Cholesterol Cal: 16 mg/dL (ref 5–40)

## 2019-05-01 ENCOUNTER — Other Ambulatory Visit: Payer: Self-pay | Admitting: Family Medicine

## 2019-05-01 DIAGNOSIS — E1169 Type 2 diabetes mellitus with other specified complication: Secondary | ICD-10-CM

## 2019-05-01 DIAGNOSIS — E785 Hyperlipidemia, unspecified: Secondary | ICD-10-CM

## 2019-05-01 MED ORDER — ROSUVASTATIN CALCIUM 10 MG PO TABS: ORAL_TABLET | ORAL | 3 refills | Status: DC

## 2019-06-22 ENCOUNTER — Encounter: Payer: Medicare Other | Admitting: Family Medicine

## 2019-07-14 ENCOUNTER — Telehealth: Payer: Self-pay | Admitting: Family Medicine

## 2019-07-14 NOTE — Chronic Care Management (AMB) (Signed)
  Chronic Care Management   Note  07/14/2019 Name: AANVI VOYLES MRN: [id number] DOB: 04/19/1956  Adrienne Davidson is a 64 y.o. year old female who is a primary care patient of Janora Norlander, DO. I reached out to [name] by phone today in response to a referral sent by Ms. Christoper Allegra Riggi's health plan.     Ms. Nicholls was given information about Chronic Care Management services today including:  1. CCM service includes personalized support from designated clinical staff supervised by her physician, including individualized plan of care and coordination with other care providers 2. 24/7 contact phone numbers for assistance for urgent and routine care needs. 3. Service will only be billed when office clinical staff spend 20 minutes or more in a month to coordinate care. 4. Only one practitioner may furnish and bill the service in a calendar month. 5. The patient may stop CCM services at any time (effective at the end of the month) by phone call to the office staff. 6. The patient will be responsible for cost sharing (co-pay) of up to 20% of the service fee (after annual deductible is met).  Patient agreed to services and verbal consent obtained.   Follow up plan: Telephone appointment with care management team member scheduled for:12/30/2019  Noreene Larsson, [place], [place],  [address] Direct Dial: [phone] [email or web address] Website: [email or web address]

## 2019-07-15 ENCOUNTER — Encounter: Payer: Self-pay | Admitting: Family Medicine

## 2019-07-15 ENCOUNTER — Ambulatory Visit (INDEPENDENT_AMBULATORY_CARE_PROVIDER_SITE_OTHER): Payer: Medicare Other | Admitting: Family Medicine

## 2019-07-15 ENCOUNTER — Other Ambulatory Visit: Payer: Self-pay

## 2019-07-15 VITALS — BP 120/72 | HR 73 | Temp 97.1°F | Ht 63.0 in | Wt 187.0 lb

## 2019-07-15 DIAGNOSIS — Z0000 Encounter for general adult medical examination without abnormal findings: Secondary | ICD-10-CM

## 2019-07-15 DIAGNOSIS — E1165 Type 2 diabetes mellitus with hyperglycemia: Secondary | ICD-10-CM

## 2019-07-15 DIAGNOSIS — Z780 Asymptomatic menopausal state: Secondary | ICD-10-CM

## 2019-07-15 DIAGNOSIS — Z0001 Encounter for general adult medical examination with abnormal findings: Secondary | ICD-10-CM | POA: Diagnosis not present

## 2019-07-15 LAB — BAYER DCA HB A1C WAIVED: HB A1C (BAYER DCA - WAIVED): 7.8 % — ABNORMAL HIGH (ref <7.0)

## 2019-07-15 MED ORDER — GLUCOSE BLOOD VI STRP: ORAL_STRIP | 12 refills | Status: DC

## 2019-07-15 MED ORDER — OZEMPIC (0.25 OR 0.5 MG/DOSE) 2 MG/1.5ML ~~LOC~~ SOPN: 0.5000 mg | PEN_INJECTOR | SUBCUTANEOUS | 12 refills | Status: DC

## 2019-07-15 MED ORDER — ONETOUCH ULTRASOFT LANCETS MISC: 12 refills | Status: DC

## 2019-07-15 NOTE — Progress Notes (Signed)
Subjective:    Adrienne Davidson is a 64 y.o. female who presents for a Welcome to Medicare exam.   She resides with her husband in [facility].  They have 2 daughters, 1 of whom resides in [place] and the other one who lives next door.  She has a Boston terrier mix named Daisy who she got in November of last year.  She reports that Charleston Ropes is a very vigorous dog but she is enjoying her company.  She often enjoys gardening with her husband during the springtime.  They were looking forward to dryer weather to do this.  No falls.  No incontinence.   Review of Systems Negative except for mild nausea with transition to Ozempic 0.5 mg subcu.  May have been due to concomitant Covid vaccine.  Overall she seems to be doing well with the Ozempic.  Her appetite has been curved as result.  She does need a new meter as she identifies this as an issue of why she is not checking her blood sugars.  Erik Obey is old and expired in 57.    Cardiac Risk Factors include: diabetes mellitus;hypertension      Objective:    Today's Vitals   07/15/19 0809  BP: 120/72  Pulse: 73  Temp: (!) 97.1 F (36.2 C)  TempSrc: Temporal  SpO2: 98%  Weight: 187 lb (84.8 kg)  Height: 5\' 3"  (1.6 m)  Body mass index is 33.13 kg/m.  Medications Outpatient Encounter Medications as of 07/15/2019  Medication Sig  . amLODipine (NORVASC) 5 MG tablet Take 1 tablet (5 mg total) by mouth daily.  Marland Kitchen aspirin (ASPIRIN 81) 81 MG EC tablet Take 81 mg by mouth daily. Swallow whole.  . Coenzyme Q10 100 MG capsule Take by mouth.  . Cranberry 250 MG CAPS Take by mouth.  . EPINEPHRINE 0.3 mg/0.3 mL IJ SOAJ injection Inject 0.3 mLs (0.3 mg total) into the muscle as needed for anaphylaxis. Then call 911  . gemfibrozil (LOPID) 600 MG tablet Take 1 tablet (600 mg total) by mouth 2 (two) times daily before a meal.  . Krill Oil 500 MG CAPS Take by mouth.  . metFORMIN (GLUCOPHAGE) 1000 MG tablet Take 1 tablet (1,000 mg total) by mouth 2 (two)  times daily with a meal.  . Multiple Vitamins-Minerals (CENTRUM ADULTS) TABS Take by mouth.  . rosuvastatin (CRESTOR) 10 MG tablet Take 1 tablet (5 mg) by MOUTH once daily  . calcipotriene (DOVONOX) 0.005 % cream Apply 1 application topically 2 (two) times daily.  . Semaglutide,0.25 or 0.5MG /DOS, (OZEMPIC, 0.25 OR 0.5 MG/DOSE,) 2 MG/1.5ML SOPN Inject 0.5 mg into the skin once a week.   No facility-administered encounter medications on file as of 07/15/2019.     History: Past Medical History:  Diagnosis Date  . Allergic rhinitis   . Diabetes mellitus ([place])   . Hyperlipidemia   . Hypertension   . Psoriasis    Bilateral feet  . Sleep apnea   . Sleep apnea   . Vitamin D deficiency    Past Surgical History:  Procedure Laterality Date  . APPENDECTOMY    . CESAREAN SECTION  1980  . CESAREAN SECTION  1984  . TONSILLECTOMY      Family History  Problem Relation Age of Onset  . Heart disease Father   . Non-Hodgkin's lymphoma Mother    Social History   Occupational History  . Not on file  Tobacco Use  . Smoking status: Former Smoker    Packs/day:  0.25    Years: 25.00    Pack years: 6.25    Types: Cigarettes    Quit date: 06/27/2018    Years since quitting: 1.0  . Smokeless tobacco: Never Used  Substance and Sexual Activity  . Alcohol use: Never  . Drug use: Never  . Sexual activity: Not on file    Tobacco Counseling Not a smoker Counseling given: No   Immunizations and Health Maintenance Immunization History  Administered Date(s) Administered  . Fluad Quad(high Dose 65+) 01/22/2019  . Influenza Split 02/06/2017  . Influenza,inj,Quad PF,6+ Mos 12/20/2017  . Moderna SARS-COVID-2 Vaccination 05/20/2019, 06/17/2019  . Pneumococcal Conjugate-13 10/27/2018  . Pneumococcal Polysaccharide-23 12/20/2015  . Zoster Recombinat (Shingrix) 10/27/2018, 01/01/2019   Health Maintenance Due  Topic Date Due  . OPHTHALMOLOGY EXAM  Never done  . HIV Screening  Never done  .  TETANUS/TDAP  Never done  . MAMMOGRAM  Never done  . DEXA SCAN  Never done  . URINE MICROALBUMIN  04/29/2019    Activities of Daily Living In your present state of health, do you have any difficulty performing the following activities: 07/15/2019  Hearing? N  Vision? N  Comment wears glasses  Difficulty concentrating or making decisions? N  Walking or climbing stairs? N  Dressing or bathing? N  Doing errands, shopping? N  Preparing Food and eating ? N  Using the Toilet? N  In the past six months, have you accidently leaked urine? N  Do you have problems with loss of bowel control? N  Managing your Medications? N  Managing your Finances? N  Housekeeping or managing your Housekeeping? N  Some recent data might be hidden    Physical Exam Gen: Well-appearing, well-groomed female.  No acute distress.  Alert and oriented HEENT: Sclera white.  Moist mucous members. no carotid bruits  Cardio: Regular rate and rhythm.  S1-S2 heard.  No murmurs Pulmonary: Clear to auscultation bilaterally.  No wheezes, rhonchi or rales.  Normal work of breathing on room air. Psych: Mood stable, speech normal, affect appropriate, good eye contact, pleasant and interactive  Advanced Directives: Does Patient Have a Medical Advance Directive?: Yes Type of Advance Directive: Living will, Healthcare Power of Attorney Does patient want to make changes to medical advance directive?: No - Patient declined    Assessment:    This is a routine wellness examination for this patient .  Overall, she is doing very well.  She is tolerating the Ozempic without difficulty.  I have refilled this for her today as well as given her a sample of One Touch Verio meter.  Testing supplies has been sent to her pharmacy.  She will follow-up in 3 months, sooner if needed  Vision/Hearing screen Wears glasses.  Dietary issues and exercise activities discussed:  Current Exercise Habits: The patient does not participate in regular  exercise at present, Exercise limited by: None identified; she does guarded during the spring and plans on starting this back soon  Goals    . Increase physical activity    . Maintain timely refills of diabetic medication as prescribed within the year .      Depression Screen PHQ 2/9 Scores 07/15/2019 04/29/2019 10/27/2018 03/28/2018  PHQ - 2 Score 0 0 0 0  PHQ- 9 Score - 0 0 -     Fall Risk Fall Risk  07/15/2019  Falls in the past year? 0    Cognitive Function:     6CIT Screen 07/15/2019  What Year? 0 points  What month? 0 points  What time? 0 points  Count back from 20 0 points  Months in reverse 0 points  Repeat phrase 0 points  Total Score 0    Patient Care Team: Janora Norlander, DO as PCP - General (Family Medicine) Ilean China, RN as Registered Nurse     Plan:      1. Welcome to Medicare preventive visit  2. Uncontrolled type 2 diabetes mellitus with hyperglycemia ([place]) She is to monitor blood sugars at least weekly if not daily.  She will schedule diabetic eye exam, mammogram. - Bayer DCA Hb A1c Waived - Microalbumin / creatinine urine ratio - Semaglutide,0.25 or 0.5MG /DOS, (OZEMPIC, 0.25 OR 0.5 MG/DOSE,) 2 MG/1.5ML SOPN; Inject 0.5 mg into the skin once a week.  Dispense: 1 pen; Refill: 12 - glucose blood test strip; Test blood sugar once daily (one touch verio if covered by ins/ otherwise whatever ins preference is) E11.65  Dispense: 100 each; Refill: 12 - Lancets (ONETOUCH ULTRASOFT) lancets; Use once daily as instructed E11.65  Dispense: 100 each; Refill: 12  3. Asymptomatic postmenopausal estrogen deficiency I will have Melissa call her to schedule.  Fortunately, time was limited today because she has an appointment at home with her insurance nurse. - DG [facility] DEXA; Future   I have personally reviewed and noted the following in the patient's chart:   . Medical and social history . Use of alcohol, tobacco or illicit drugs  . Current medications and  supplements . Functional ability and status . Nutritional status . Physical activity . Advanced directives . List of other physicians . Hospitalizations, surgeries, and ER visits in previous 12 months . Vitals . Screenings to include cognitive, depression, and falls . Referrals and appointments  In addition, I have reviewed and discussed with patient certain preventive protocols, quality metrics, and best practice recommendations. A written personalized care plan for preventive services as well as general preventive health recommendations were provided to patient.     Ronnie Doss, DO 07/15/2019

## 2019-07-15 NOTE — Patient Instructions (Signed)
Thank you for coming in today for your Annual Medicare Wellness Visit.  Things that we discussed today are included in this packet.  Create and/or bring a copy of your Living Will/ Advanced Directive into the office so that we may respect your wishes should an emergency occur.  Get the recommended life-saving vaccines we discussed today.  Get your mammogram/ colonoscopy/ DEXA scans as directed by your provider.  Make sure that your medications are organized and safely stored.  Remember to always ask for help if you forget when/ how to take your medications.  Make healthy food choices (Rich in fruits/ veggies/ lean meats and low in salt, sugar and fat)  Do something that you enjoy for at least 30 minutes every day to stay active (walking, gardening, swimming, etc). This will help you lower your risk of falls/ broken bones.  Be social, do puzzles/ crosswords.  These things help the mind stay young and lower your risk of developing dementia.  Make sure that your home is safe by checking your smoke detectors regularly and doing the things outlined below to lower your risk of falls.  Fall Prevention in the Home Falls can cause injuries and can affect people from all age groups. There are many simple things that you can do to make your home safe and to help prevent falls. What can I do on the outside of my home? Regularly repair the edges of walkways and driveways and fix any cracks. Remove high doorway thresholds. Trim any shrubbery on the main path into your home. Use bright outdoor lighting. Clear walkways of debris and clutter, including tools and rocks. Regularly check that handrails are securely fastened and in good repair. Both sides of any steps should have handrails. Install guardrails along the edges of any raised decks or porches. Have leaves, snow, and ice cleared regularly. Use sand or salt on walkways during winter months. In the garage, clean up any spills right away, including  grease or oil spills. What can I do in the bathroom? Use night lights. Install grab bars by the toilet and in the tub and shower. Do not use towel bars as grab bars. Use non-skid mats or decals on the floor of the tub or shower. If you need to sit down while you are in the shower, use a plastic, non-slip stool. Keep the floor dry. Immediately clean up any water that spills on the floor. Remove soap buildup in the tub or shower on a regular basis. Attach bath mats securely with double-sided non-slip rug tape. Remove throw rugs and other tripping hazards from the floor. What can I do in the bedroom? Use night lights. Make sure that a bedside light is easy to reach. Do not use oversized bedding that drapes onto the floor. Have a firm chair that has side arms to use for getting dressed. Remove throw rugs and other tripping hazards from the floor. What can I do in the kitchen? Clean up any spills right away. Avoid walking on wet floors. Place frequently used items in easy-to-reach places. If you need to reach for something above you, use a sturdy step stool that has a grab bar. Keep electrical cables out of the way. Do not use floor polish or wax that makes floors slippery. If you have to use wax, make sure that it is non-skid floor wax. Remove throw rugs and other tripping hazards from the floor. What can I do in the stairways? Do not leave any items on the stairs.   Make sure that there are handrails on both sides of the stairs. Fix handrails that are broken or loose. Make sure that handrails are as long as the stairways. Check any carpeting to make sure that it is firmly attached to the stairs. Fix any carpet that is loose or worn. Avoid having throw rugs at the top or bottom of stairways, or secure the rugs with carpet tape to prevent them from moving. Make sure that you have a light switch at the top of the stairs and the bottom of the stairs. If you do not have them, have them  installed. What are some other fall prevention tips? Wear closed-toe shoes that fit well and support your feet. Wear shoes that have rubber soles or low heels. When you use a stepladder, make sure that it is completely opened and that the sides are firmly locked. Have someone hold the ladder while you are using it. Do not climb a closed stepladder. Add color or contrast paint or tape to grab bars and handrails in your home. Place contrasting color strips on the first and last steps. Use mobility aids as needed, such as canes, walkers, scooters, and crutches. Turn on lights if it is dark. Replace any light bulbs that burn out. Set up furniture so that there are clear paths. Keep the furniture in the same spot. Fix any uneven floor surfaces. Choose a carpet design that does not hide the edge of steps of a stairway. Be aware of any and all pets. Review your medicines with your healthcare provider. Some medicines can cause dizziness or changes in blood pressure, which increase your risk of falling. Talk with your health care provider about other ways that you can decrease your risk of falls. This may include working with a physical therapist or trainer to improve your strength, balance, and endurance. This information is not intended to replace advice given to you by your health care provider. Make sure you discuss any questions you have with your health care provider. Document Released: 03/30/2002 Document Revised: 09/06/2015 Document Reviewed: 05/14/2014 Elsevier Interactive Patient Education  2017 [employer]  

## 2019-07-16 LAB — MICROALBUMIN / CREATININE URINE RATIO
Creatinine, Urine: 71.2 mg/dL
Microalb/Creat Ratio: 197 mg/g creat — ABNORMAL HIGH (ref 0–29)
Microalbumin, Urine: 140.5 ug/mL

## 2019-07-17 ENCOUNTER — Other Ambulatory Visit: Payer: Self-pay

## 2019-07-17 ENCOUNTER — Ambulatory Visit (INDEPENDENT_AMBULATORY_CARE_PROVIDER_SITE_OTHER): Payer: Medicare Other

## 2019-07-17 DIAGNOSIS — Z780 Asymptomatic menopausal state: Secondary | ICD-10-CM | POA: Diagnosis not present

## 2019-07-17 DIAGNOSIS — M85852 Other specified disorders of bone density and structure, left thigh: Secondary | ICD-10-CM | POA: Diagnosis not present

## 2019-08-26 ENCOUNTER — Other Ambulatory Visit: Payer: Self-pay | Admitting: Family Medicine

## 2019-08-26 DIAGNOSIS — E1169 Type 2 diabetes mellitus with other specified complication: Secondary | ICD-10-CM

## 2019-08-30 LAB — HM DIABETES EYE EXAM

## 2019-09-16 DIAGNOSIS — Z1231 Encounter for screening mammogram for malignant neoplasm of breast: Secondary | ICD-10-CM | POA: Diagnosis not present

## 2019-10-15 ENCOUNTER — Other Ambulatory Visit: Payer: Self-pay | Admitting: Family Medicine

## 2019-10-15 DIAGNOSIS — E1122 Type 2 diabetes mellitus with diabetic chronic kidney disease: Secondary | ICD-10-CM

## 2019-10-16 NOTE — Telephone Encounter (Signed)
Called patient, states in car, will call office Monday to schedule.

## 2019-10-16 NOTE — Telephone Encounter (Signed)
Last OV 07/15/19. Last RF 10/27/18. Next OV-not scheduled  Pt ntbs for further refills

## 2019-10-23 ENCOUNTER — Ambulatory Visit (INDEPENDENT_AMBULATORY_CARE_PROVIDER_SITE_OTHER): Payer: Medicare Other | Admitting: Family Medicine

## 2019-10-23 ENCOUNTER — Encounter: Payer: Self-pay | Admitting: Family Medicine

## 2019-10-23 ENCOUNTER — Other Ambulatory Visit: Payer: Self-pay

## 2019-10-23 VITALS — BP 139/84 | HR 74 | Temp 97.4°F | Ht 63.0 in | Wt 186.0 lb

## 2019-10-23 DIAGNOSIS — E1165 Type 2 diabetes mellitus with hyperglycemia: Secondary | ICD-10-CM

## 2019-10-23 DIAGNOSIS — E785 Hyperlipidemia, unspecified: Secondary | ICD-10-CM

## 2019-10-23 DIAGNOSIS — E1169 Type 2 diabetes mellitus with other specified complication: Secondary | ICD-10-CM

## 2019-10-23 DIAGNOSIS — Z87898 Personal history of other specified conditions: Secondary | ICD-10-CM | POA: Diagnosis not present

## 2019-10-23 DIAGNOSIS — R809 Proteinuria, unspecified: Secondary | ICD-10-CM

## 2019-10-23 DIAGNOSIS — I10 Essential (primary) hypertension: Secondary | ICD-10-CM

## 2019-10-23 DIAGNOSIS — E1159 Type 2 diabetes mellitus with other circulatory complications: Secondary | ICD-10-CM | POA: Diagnosis not present

## 2019-10-23 DIAGNOSIS — E1129 Type 2 diabetes mellitus with other diabetic kidney complication: Secondary | ICD-10-CM | POA: Diagnosis not present

## 2019-10-23 LAB — BAYER DCA HB A1C WAIVED: HB A1C (BAYER DCA - WAIVED): 7.5 % — ABNORMAL HIGH (ref <7.0)

## 2019-10-23 MED ORDER — ONETOUCH ULTRASOFT LANCETS MISC: 12 refills | Status: DC

## 2019-10-23 MED ORDER — OZEMPIC (1 MG/DOSE) 2 MG/1.5ML ~~LOC~~ SOPN: 1.0000 mg | PEN_INJECTOR | SUBCUTANEOUS | 4 refills | Status: DC

## 2019-10-23 NOTE — Progress Notes (Signed)
Subjective: CC: f/u  DM2, HTN, HLD PCP: Janora Norlander, DO PFX:Adrienne Davidson is a 64 y.o. female presenting to clinic today for:  1. Type 2 Diabetes with HTN, HLD:  Patient reports compliance w/ Metformin, Ozempic (started last visit), Crestor 10mg  (increased dose last visit), gemfibrozil, Norvasc and a baby aspirin.  BGs have been ok.  No BG >200 and none <70.  Nausea associated with the Ozempic has totally resolved.  She is tolerating the increased dose of Crestor without difficulty.   Last eye exam: UTD, No retinopathy. sees Dr Florina Ou in [place].   Last foot exam: UTD Last A1c:  Lab Results  Component Value Date   HGBA1C 7.8 (H) 07/15/2019   Nephropathy screen indicated? Done with medicare nurse recently. Microalbuminuria present.  (hx angioedema/ anaphylaxis to ACE-I so no ACE-I/ ARBs) she needs an EpiPen to have on hand for this  Last flu, zoster and/or pneumovax:   Immunization History  Administered Date(s) Administered  . Fluad Quad(high Dose 65+) 01/22/2019  . Influenza Split 02/06/2017  . Influenza,inj,Quad PF,6+ Mos 12/20/2017  . Moderna SARS-COVID-2 Vaccination 05/20/2019, 06/17/2019  . Pneumococcal Conjugate-13 10/27/2018  . Pneumococcal Polysaccharide-23 12/20/2015  . Zoster Recombinat (Shingrix) 10/27/2018, 01/01/2019    ROS: No chest pain, shortness of breath, edema, dizziness or falls.   Allergies  Allergen Reactions  . Enalapril Anaphylaxis    Angioedema Aug 2018   Past Medical History:  Diagnosis Date  . Allergic rhinitis   . Diabetes mellitus ([place])   . Hyperlipidemia   . Hypertension   . Psoriasis    Bilateral feet  . Sleep apnea   . Sleep apnea   . Vitamin D deficiency     Current Outpatient Medications:  .  amLODipine (NORVASC) 5 MG tablet, Take 1 tablet (5 mg total) by mouth daily., Disp: 90 tablet, Rfl: 3 .  aspirin (ASPIRIN 81) 81 MG EC tablet, Take 81 mg by mouth daily. Swallow whole., Disp: , Rfl:  .  calcipotriene (DOVONOX)  0.005 % cream, Apply 1 application topically 2 (two) times daily., Disp: , Rfl:  .  Coenzyme Q10 100 MG capsule, Take by mouth., Disp: , Rfl:  .  Cranberry 250 MG CAPS, Take by mouth., Disp: , Rfl:  .  EPINEPHRINE 0.3 mg/0.3 mL IJ SOAJ injection, Inject 0.3 mLs (0.3 mg total) into the muscle as needed for anaphylaxis. Then call 911, Disp: 2 each, Rfl: 0 .  gemfibrozil (LOPID) 600 MG tablet, Take 1 tablet (600 mg total) by mouth 2 (two) times daily before a meal., Disp: 180 tablet, Rfl: 0 .  glucose blood test strip, Test blood sugar once daily (one touch verio if covered by ins/ otherwise whatever ins preference is) E11.65, Disp: 100 each, Rfl: 12 .  Krill Oil 500 MG CAPS, Take by mouth., Disp: , Rfl:  .  Lancets (ONETOUCH ULTRASOFT) lancets, Use once daily as instructed E11.65, Disp: 100 each, Rfl: 12 .  metFORMIN (GLUCOPHAGE) 1000 MG tablet, Take 1 tablet (1,000 mg total) by mouth 2 (two) times daily with a meal. Needs to be seen for further refills, Disp: 180 tablet, Rfl: 0 .  Multiple Vitamins-Minerals (CENTRUM ADULTS) TABS, Take by mouth., Disp: , Rfl:  .  rosuvastatin (CRESTOR) 10 MG tablet, Take 1 tablet (5 mg) by MOUTH once daily, Disp: 90 tablet, Rfl: 3 .  Semaglutide,0.25 or 0.5MG /DOS, (OZEMPIC, 0.25 OR 0.5 MG/DOSE,) 2 MG/1.5ML SOPN, Inject 0.5 mg into the skin once a week., Disp: 1 pen, Rfl:  12 Social History   Socioeconomic History  . Marital status: Married    Spouse name: [name]  . Number of children: 2  . Years of education: 57  . Highest education level: Associate degree: occupational, Hotel manager, or vocational program  Occupational History  . Not on file  Tobacco Use  . Smoking status: Former Smoker    Packs/day: 0.25    Years: 25.00    Pack years: 6.25    Types: Cigarettes    Quit date: 06/27/2018    Years since quitting: 1.3  . Smokeless tobacco: Never Used  Vaping Use  . Vaping Use: Never used  Substance and Sexual Activity  . Alcohol use: Never  . Drug use: Never    . Sexual activity: Not on file  Other Topics Concern  . Not on file  Social History Narrative  . Not on file   Social Determinants of Health   Financial Resource Strain:   . Difficulty of Paying Living Expenses:   Food Insecurity:   . Worried About Charity fundraiser in the Last Year:   . Arboriculturist in the Last Year:   Transportation Needs:   . Film/video editor (Medical):   Marland Kitchen Lack of Transportation (Non-Medical):   Physical Activity:   . Days of Exercise per Week:   . Minutes of Exercise per Session:   Stress:   . Feeling of Stress :   Social Connections:   . Frequency of Communication with Friends and Family:   . Frequency of Social Gatherings with Friends and Family:   . Attends Religious Services:   . Active Member of Clubs or Organizations:   . Attends Archivist Meetings:   Marland Kitchen Marital Status:   Intimate Partner Violence:   . Fear of Current or Ex-Partner:   . Emotionally Abused:   Marland Kitchen Physically Abused:   . Sexually Abused:    Objective: Office vital signs reviewed. BP 139/84   Pulse 74   Temp (!) 97.4 F (36.3 C) (Temporal)   Ht 5\' 3"  (1.6 m)   Wt 186 lb (84.4 kg)   SpO2 99%   BMI 32.95 kg/m   Physical Examination:  General: Awake, alert, well nourished, No acute distress HEENT: Normal, sclera white, MMM Cardio: regular rate and rhythm, S1S2 heard, no murmurs appreciated Pulm: clear to auscultation bilaterally, no wheezes, rhonchi or rales; normal work of breathing on room air Extremities: warm, well perfused, No edema, cyanosis or clubbing; +2 pulses bilaterally  Assessment/ Plan: 64 y.o. female   1. Uncontrolled type 2 diabetes mellitus with hyperglycemia (HCC) A1c slightly better than last check but still uncontrolled at 7.5 today.  I have increased her Ozempic to 1 mg subcu every 7 days.  She will follow-up with me in 3 months, sooner if needed - Bayer DCA Hb A1c Waived - Lancets (ONETOUCH ULTRASOFT) lancets; Use once daily as  instructed E11.65 (one touch verio)  Dispense: 100 each; Refill: 12 - Semaglutide, 1 MG/DOSE, (OZEMPIC, 1 MG/DOSE,) 2 MG/1.5ML SOPN; Inject 0.75 mLs (1 mg total) into the skin once a week.  Dispense: 3 pen; Refill: 4  2. Hyperlipidemia associated with type 2 diabetes mellitus ([place]) Check direct LDL, LFTs given increase statin.  She is tolerating this without difficulty - LDL Cholesterol, Direct - Hepatic Function Panel  3. Hypertension associated with diabetes ([place]) Controlled.  Continue current regimen  4. Microalbuminuria due to type 2 diabetes mellitus ([place]) Unable to take ACE inhibitors or ARB secondary  to history of angioedema  5. History of angioedema    Orders Placed This Encounter  Procedures  . Bayer DCA Hb A1c Waived  . LDL Cholesterol, Direct  . Hepatic Function Panel   Meds ordered this encounter  Medications  . Lancets (ONETOUCH ULTRASOFT) lancets    Sig: Use once daily as instructed E11.65 (one touch verio)    Dispense:  100 each    Refill:  [place], [place] [phone]

## 2019-10-24 LAB — HEPATIC FUNCTION PANEL
ALT: 16 IU/L (ref 0–32)
AST: 19 IU/L (ref 0–40)
Albumin: 4.5 g/dL (ref 3.8–4.8)
Alkaline Phosphatase: 82 IU/L (ref 48–121)
Bilirubin Total: 0.3 mg/dL (ref 0.0–1.2)
Bilirubin, Direct: 0.1 mg/dL (ref 0.00–0.40)
Total Protein: 7.4 g/dL (ref 6.0–8.5)

## 2019-10-24 LAB — LDL CHOLESTEROL, DIRECT: LDL Direct: 116 mg/dL — ABNORMAL HIGH (ref 0–99)

## 2019-11-12 ENCOUNTER — Telehealth: Payer: Self-pay | Admitting: Pharmacist

## 2019-11-12 NOTE — Telephone Encounter (Signed)
PATIENT ASSISTANCE APPLICATION SENT TO [facility] FOR OZEMPIC WILL F/U FOR APPROVAL  A1C 7.5%--JUST INCREASED OZEMPIC TO 1MG  SQ WEEKLY PCP F/U ON 01/22/20

## 2019-11-30 ENCOUNTER — Other Ambulatory Visit: Payer: Self-pay | Admitting: Family Medicine

## 2019-11-30 DIAGNOSIS — E785 Hyperlipidemia, unspecified: Secondary | ICD-10-CM

## 2019-12-03 ENCOUNTER — Telehealth: Payer: Self-pay | Admitting: Pharmacist

## 2019-12-03 NOTE — Telephone Encounter (Signed)
Call placed to patient to inform of patient assistance medication shipment Ozempic 1mg  sq weekly labeled in fridge for patient to pick up

## 2019-12-04 ENCOUNTER — Telehealth: Payer: Self-pay | Admitting: Pharmacist

## 2019-12-04 NOTE — Telephone Encounter (Signed)
Spoke with patient regarding gemfibrozil Patient to discontinue gemfibrozil and continue on rosuvastatin Patient verbalizes understanding Medication list updated

## 2019-12-30 ENCOUNTER — Ambulatory Visit: Payer: Medicare Other | Admitting: *Deleted

## 2019-12-30 DIAGNOSIS — E1159 Type 2 diabetes mellitus with other circulatory complications: Secondary | ICD-10-CM

## 2019-12-30 DIAGNOSIS — E1165 Type 2 diabetes mellitus with hyperglycemia: Secondary | ICD-10-CM

## 2019-12-30 DIAGNOSIS — E785 Hyperlipidemia, unspecified: Secondary | ICD-10-CM

## 2019-12-30 NOTE — Chronic Care Management (AMB) (Signed)
Chronic Care Management   Initial Visit Note  12/30/2019 Name: Adrienne Davidson MRN: [id number] DOB: 30-May-1955  Referred by: Janora Norlander, DO Reason for referral : Chronic Care Management (Initial Visit)   SABRIEL BORROMEO is a 64 y.o. year old female who is a primary care patient of Janora Norlander, DO. The CCM team was consulted for assistance with chronic disease management and care coordination needs related to DM, HTN, HLD, OSA.  Review of patient status, including review of consultants reports, relevant laboratory and other test results, and collaboration with appropriate care team members and the patient's provider was performed as part of comprehensive patient evaluation and provision of chronic care management services.    Subjective: I spoke with Adrienne Davidson by telephone today regarding management of her chronic medical conditions.   SDOH (Social Determinants of Health) assessments performed: Yes See Care Plan activities for detailed interventions related to SDOH  SDOH Interventions     Most Recent Value  SDOH Interventions  Physical Activity Interventions [facility] [employer]       Objective: Outpatient Encounter Medications as of 12/30/2019  Medication Sig  . amLODipine (NORVASC) 5 MG tablet Take 1 tablet (5 mg total) by mouth daily.  Marland Kitchen aspirin (ASPIRIN 81) 81 MG EC tablet Take 81 mg by mouth daily. Swallow whole.  . calcipotriene (DOVONOX) 0.005 % cream Apply 1 application topically 2 (two) times daily.  . Coenzyme Q10 100 MG capsule Take by mouth.  . Cranberry 250 MG CAPS Take by mouth.  . EPINEPHRINE 0.3 mg/0.3 mL IJ SOAJ injection Inject 0.3 mLs (0.3 mg total) into the muscle as needed for anaphylaxis. Then call 911  . glucose blood test strip Test blood sugar once daily (one touch verio if covered by ins/ otherwise whatever ins preference is) E11.65  . Krill Oil 500 MG CAPS Take by mouth.  . Lancets (ONETOUCH ULTRASOFT) lancets Use once daily as instructed E11.65  (one touch verio)  . metFORMIN (GLUCOPHAGE) 1000 MG tablet Take 1 tablet (1,000 mg total) by mouth 2 (two) times daily with a meal. Needs to be seen for further refills  . Multiple Vitamins-Minerals (CENTRUM ADULTS) TABS Take by mouth.  . rosuvastatin (CRESTOR) 10 MG tablet Take 1 tablet (5 mg) by MOUTH once daily  . Semaglutide, 1 MG/DOSE, (OZEMPIC, 1 MG/DOSE,) 2 MG/1.5ML SOPN Inject 0.75 mLs (1 mg total) into the skin once a week.   No facility-administered encounter medications on file as of 12/30/2019.     BP Readings from Last 3 Encounters:  10/23/19 139/84  07/15/19 120/72  04/29/19 132/73   Lab Results  Component Value Date   HGBA1C 7.5 (H) 10/23/2019   HGBA1C 7.8 (H) 07/15/2019   HGBA1C 7.6 (H) 04/29/2019   Lab Results  Component Value Date   LDLCALC 118 (H) 04/29/2019   CREATININE 0.97 04/29/2019   Lab Results  Component Value Date   CHOL 194 04/29/2019   HDL 60 04/29/2019   LDLCALC 118 (H) 04/29/2019   LDLDIRECT 116 (H) 10/23/2019   TRIG 86 04/29/2019   CHOLHDL 3.2 04/29/2019     RN Care Plan: Goals Addressed            This Visit's Progress   . Chronic Disease Management Needs       CARE PLAN ENTRY (see longtitudinal plan of care for additional care plan information)  Current Barriers:  . Chronic Disease Management support, education, and care coordination needs related to DM, HTN, HLD, OSA  Clinical  Goal(s) related to DM, HTN, HLD, OSA:  Over the next 60 days, patient will:  . Work with the care management team to address educational, disease management, and care coordination needs  . Begin or continue self health monitoring activities as directed today Measure and record cbg (blood glucose) 1 times daily and Measure and record blood pressure 3 times per week . Call provider office for new or worsened signs and symptoms Blood glucose findings outside established parameters and Blood pressure findings outside established parameters . Call care management  team with questions or concerns . Verbalize basic understanding of patient centered plan of care established today  Interventions related to DM, HTN, HLD, OSA:  . Evaluation of current treatment plans and patient's adherence to plan as established by provider . Assessed patient understanding of disease states . Assessed patient's education and care coordination needs . Provided disease specific education to patient  . Collaborated with appropriate clinical care team members regarding patient needs . Chart reviewed including recent office notes and lab results . Reviewed and medications . Encouraged patient to reach out to [facility] as needed . Discussed mobility, physical activity, and ability to perform ADLs . Discussed diet . Discussed social support . Reviewed upcoming appointments  Patient Self Care Activities related to DM, HTN, HLD, OSA:  . Patient is unable to independently self-manage chronic health conditions  Initial goal documentation        Plan:   The care management team will reach out to the patient again over the next 60 days.   Chong Sicilian, BSN, RN-BC Embedded Chronic Care Manager [facility] / [place] [facility] Direct Dial: [phone]

## 2019-12-30 NOTE — Patient Instructions (Signed)
Visit Information  Goals Addressed            This Visit's Progress   . Chronic Disease Management Needs       CARE PLAN ENTRY (see longtitudinal plan of care for additional care plan information)  Current Barriers:  . Chronic Disease Management support, education, and care coordination needs related to DM, HTN, HLD, OSA  Clinical Goal(s) related to DM, HTN, HLD, OSA:  Over the next 60 days, patient will:  . Work with the care management team to address educational, disease management, and care coordination needs  . Begin or continue self health monitoring activities as directed today Measure and record cbg (blood glucose) 1 times daily and Measure and record blood pressure 3 times per week . Call provider office for new or worsened signs and symptoms Blood glucose findings outside established parameters and Blood pressure findings outside established parameters . Call care management team with questions or concerns . Verbalize basic understanding of patient centered plan of care established today  Interventions related to DM, HTN, HLD, OSA:  . Evaluation of current treatment plans and patient's adherence to plan as established by provider . Assessed patient understanding of disease states . Assessed patient's education and care coordination needs . Provided disease specific education to patient  . Collaborated with appropriate clinical care team members regarding patient needs . Chart reviewed including recent office notes and lab results . Reviewed and medications . Encouraged patient to reach out to [facility] as needed . Discussed mobility, physical activity, and ability to perform ADLs . Discussed diet . Discussed social support . Reviewed upcoming appointments  Patient Self Care Activities related to DM, HTN, HLD, OSA:  . Patient is unable to independently self-manage chronic health conditions  Initial goal documentation        Adrienne Davidson was given  information about Chronic Care Management services today including:  1. CCM service includes personalized support from designated clinical staff supervised by her physician, including individualized plan of care and coordination with other care providers 2. 24/7 contact phone numbers for assistance for urgent and routine care needs. 3. Service will only be billed when office clinical staff spend 20 minutes or more in a month to coordinate care. 4. Only one practitioner may furnish and bill the service in a calendar month. 5. The patient may stop CCM services at any time (effective at the end of the month) by phone call to the office staff. 6. The patient will be responsible for cost sharing (co-pay) of up to 20% of the service fee (after annual deductible is met).  Patient agreed to services and verbal consent obtained.   Patient verbalizes understanding of instructions provided today.   The care management team will reach out to the patient again over the next 60 days.   Chong Sicilian, BSN, RN-BC Embedded Chronic Care Manager [facility] / [place] [facility] Direct Dial: [phone]

## 2020-01-04 ENCOUNTER — Other Ambulatory Visit: Payer: Self-pay | Admitting: Family Medicine

## 2020-01-04 DIAGNOSIS — E1159 Type 2 diabetes mellitus with other circulatory complications: Secondary | ICD-10-CM

## 2020-01-11 ENCOUNTER — Other Ambulatory Visit: Payer: Self-pay | Admitting: Family Medicine

## 2020-01-11 DIAGNOSIS — N183 Chronic kidney disease, stage 3 unspecified: Secondary | ICD-10-CM

## 2020-01-21 NOTE — Progress Notes (Signed)
Subjective: CC: f/u  DM2, HTN, HLD PCP: Adrienne Norlander, DO Adrienne Davidson:VZDGL Adrienne Davidson is a 64 y.o. female presenting to clinic today for:  1. Type 2 Diabetes with HTN, HLD:  Patient reports compliance w/ Metformin, Ozempic 1mg  subq every week (increased last visit), Crestor 10mg , gemfibrozil, Norvasc and a baby aspirin.  Overall she seems to be doing well.  She has noticed a decrease in appetite with this medicine.  Blood sugars are running anywhere from 110-230.  Average fasting blood sugars around 170.  She does admit to snacking before bedtime each night and this typically is a sweet snack.  No chest pain, shortness of breath, headache, blurred vision, sensory changes or foot ulcerations.  Last eye exam: UTD, No retinopathy. sees Adrienne Davidson in [place].   Last foot exam: needs Last A1c:  Lab Results  Component Value Date   HGBA1C 7.5 (H) 10/23/2019   Nephropathy screen indicated? Done with medicare nurse recently. Microalbuminuria present.  (hx angioedema/ anaphylaxis to ACE-I so no ACE-I/ ARBs) she needs an EpiPen to have on hand for this  Last flu, zoster and/or pneumovax:   Immunization History  Administered Date(s) Administered  . Fluad Quad(high Dose 65+) 01/22/2019  . Influenza Split 02/06/2017  . Influenza,inj,Quad PF,6+ Mos 12/20/2017  . Moderna SARS-COVID-2 Vaccination 05/20/2019, 06/17/2019  . Pneumococcal Conjugate-13 10/27/2018  . Pneumococcal Polysaccharide-23 12/20/2015  . Zoster Recombinat (Shingrix) 10/27/2018, 01/01/2019    Allergies  Allergen Reactions  . Enalapril Anaphylaxis    Angioedema Aug 2018   Past Medical History:  Diagnosis Date  . Allergic rhinitis   . Diabetes mellitus ([place])   . Hyperlipidemia   . Hypertension   . Psoriasis    Bilateral feet  . Sleep apnea   . Sleep apnea   . Vitamin D deficiency     Current Outpatient Medications:  .  amLODipine (NORVASC) 5 MG tablet, Take 1 tablet (5 mg total) by mouth daily., Disp: 90 tablet, Rfl:  0 .  aspirin (ASPIRIN 81) 81 MG EC tablet, Take 81 mg by mouth daily. Swallow whole., Disp: , Rfl:  .  calcipotriene (DOVONOX) 0.005 % cream, Apply 1 application topically 2 (two) times daily., Disp: , Rfl:  .  Coenzyme Q10 100 MG capsule, Take by mouth., Disp: , Rfl:  .  Cranberry 250 MG CAPS, Take by mouth., Disp: , Rfl:  .  EPINEPHRINE 0.3 mg/0.3 mL IJ SOAJ injection, Inject 0.3 mLs (0.3 mg total) into the muscle as needed for anaphylaxis. Then call 911, Disp: 2 each, Rfl: 0 .  glucose blood test strip, Test blood sugar once daily (one touch verio if covered by ins/ otherwise whatever ins preference is) E11.65, Disp: 100 each, Rfl: 12 .  Krill Oil 500 MG CAPS, Take by mouth., Disp: , Rfl:  .  Lancets (ONETOUCH ULTRASOFT) lancets, Use once daily as instructed E11.65 (one touch verio), Disp: 100 each, Rfl: 12 .  metFORMIN (GLUCOPHAGE) 1000 MG tablet, Take 1 tablet (1,000 mg total) by mouth 2 (two) times daily with a meal., Disp: 180 tablet, Rfl: 0 .  Multiple Vitamins-Minerals (CENTRUM ADULTS) TABS, Take by mouth., Disp: , Rfl:  .  rosuvastatin (CRESTOR) 10 MG tablet, Take 1 tablet (5 mg) by MOUTH once daily, Disp: 90 tablet, Rfl: 3 .  Semaglutide, 1 MG/DOSE, (OZEMPIC, 1 MG/DOSE,) 2 MG/1.5ML SOPN, Inject 0.75 mLs (1 mg total) into the skin once a week., Disp: 3 pen, Rfl: 4 Social History   Socioeconomic History  . Marital  status: Married    Spouse name: Adrienne Davidson  . Number of children: 2  . Years of education: 75  . Highest education level: Associate degree: occupational, Hotel manager, or vocational program  Occupational History  . Not on file  Tobacco Use  . Smoking status: Former Smoker    Packs/day: 0.25    Years: 25.00    Pack years: 6.25    Types: Cigarettes    Quit date: 06/27/2018    Years since quitting: 1.5  . Smokeless tobacco: Never Used  Vaping Use  . Vaping Use: Never used  Substance and Sexual Activity  . Alcohol use: Never  . Drug use: Never  . Sexual activity: Not on file   Other Topics Concern  . Not on file  Social History Narrative  . Not on file   Social Determinants of Health   Financial Resource Strain: Low Risk   . Difficulty of Paying Living Expenses: Not hard at all  Food Insecurity: No Food Insecurity  . Worried About Charity fundraiser in the Last Year: Never true  . Ran Out of Food in the Last Year: Never true  Transportation Needs: No Transportation Needs  . Lack of Transportation (Medical): No  . Lack of Transportation (Non-Medical): No  Physical Activity: Inactive  . Days of Exercise per Week: 0 days  . Minutes of Exercise per Session: 0 min  Stress: No Stress Concern Present  . Feeling of Stress : Only a little  Social Connections: Socially Integrated  . Frequency of Communication with Friends and Family: More than three times a week  . Frequency of Social Gatherings with Friends and Family: More than three times a week  . Attends Religious Services: More than 4 times per year  . Active Member of Clubs or Organizations: Yes  . Attends Archivist Meetings: More than 4 times per year  . Marital Status: Married  Human resources officer Violence: Not At Risk  . Fear of Current or Ex-Partner: No  . Emotionally Abused: No  . Physically Abused: No  . Sexually Abused: No   Objective: Office vital signs reviewed. BP 122/66   Pulse 81   Temp (!) 96.9 F (36.1 C)   Ht 5\' 3"  (1.6 m)   Wt 183 lb (83 kg)   SpO2 99%   BMI 32.42 kg/m   Physical Examination:  General: Awake, alert, well nourished, No acute distress HEENT: Normal, sclera white, MMM, no carotid bruits Cardio: regular rate and rhythm, S1S2 heard, no murmurs appreciated Pulm: clear to auscultation bilaterally, no wheezes, rhonchi or rales; normal work of breathing on room air Extremities: warm, well perfused, No edema, cyanosis or clubbing; +2 pulses bilaterally Neuro: see DM foot Diabetic Foot Exam - Simple   Simple Foot Form Diabetic Foot exam was performed with  the following findings: Yes 01/22/2020  9:32 AM  Visual Inspection No deformities, no ulcerations, no other skin breakdown bilaterally: Yes Sensation Testing Intact to touch and monofilament testing bilaterally: Yes Pulse Check Posterior Tibialis and Dorsalis pulse intact bilaterally: Yes Comments     Assessment/ Plan: 64 y.o. female   1. Uncontrolled type 2 diabetes mellitus with hyperglycemia (HCC) Remains uncontrolled but A1c is improving slightly to 7.3.  We discussed eliminating nighttime snack and are replacing it with a sugar-free option.  Reinforced carb restriction.  We will plan to increase Ozempic further if she cannot achieve blood sugar control with modification of diet alone.  She will follow-up in 3 months, sooner if  needed - Bayer DCA Hb A1c Waived  2. Hyperlipidemia associated with type 2 diabetes mellitus ([place]) Continue statin  3. Hypertension associated with diabetes ([place]) Controlled - Basic Metabolic Panel  4. Microalbuminuria due to type 2 diabetes mellitus ([place]) - Basic Metabolic Panel  5. History of angioedema  6. Need for immunization against influenza Administered - Flu Vaccine QUAD High Dose(Fluad)    No orders of the defined types were placed in this encounter.  No orders of the defined types were placed in this encounter.    Adrienne Norlander, DO [facility] [phone]

## 2020-01-22 ENCOUNTER — Encounter: Payer: Self-pay | Admitting: Family Medicine

## 2020-01-22 ENCOUNTER — Other Ambulatory Visit: Payer: Self-pay

## 2020-01-22 ENCOUNTER — Ambulatory Visit (INDEPENDENT_AMBULATORY_CARE_PROVIDER_SITE_OTHER): Payer: Medicare Other | Admitting: Family Medicine

## 2020-01-22 VITALS — BP 122/66 | HR 81 | Temp 96.9°F | Ht 63.0 in | Wt 183.0 lb

## 2020-01-22 DIAGNOSIS — R809 Proteinuria, unspecified: Secondary | ICD-10-CM

## 2020-01-22 DIAGNOSIS — E785 Hyperlipidemia, unspecified: Secondary | ICD-10-CM

## 2020-01-22 DIAGNOSIS — Z23 Encounter for immunization: Secondary | ICD-10-CM

## 2020-01-22 DIAGNOSIS — E1159 Type 2 diabetes mellitus with other circulatory complications: Secondary | ICD-10-CM

## 2020-01-22 DIAGNOSIS — I152 Hypertension secondary to endocrine disorders: Secondary | ICD-10-CM | POA: Diagnosis not present

## 2020-01-22 DIAGNOSIS — E1165 Type 2 diabetes mellitus with hyperglycemia: Secondary | ICD-10-CM | POA: Diagnosis not present

## 2020-01-22 DIAGNOSIS — E1169 Type 2 diabetes mellitus with other specified complication: Secondary | ICD-10-CM

## 2020-01-22 DIAGNOSIS — Z87898 Personal history of other specified conditions: Secondary | ICD-10-CM

## 2020-01-22 DIAGNOSIS — E1129 Type 2 diabetes mellitus with other diabetic kidney complication: Secondary | ICD-10-CM | POA: Diagnosis not present

## 2020-01-22 LAB — BASIC METABOLIC PANEL
BUN/Creatinine Ratio: 22 (ref 12–28)
BUN: 20 mg/dL (ref 8–27)
CO2: 23 mmol/L (ref 20–29)
Calcium: 10.6 mg/dL — ABNORMAL HIGH (ref 8.7–10.3)
Chloride: 102 mmol/L (ref 96–106)
Creatinine, Ser: 0.93 mg/dL (ref 0.57–1.00)
GFR calc Af Amer: 74 mL/min/{1.73_m2} (ref >59)
GFR calc non Af Amer: 64 mL/min/{1.73_m2} (ref >59)
Glucose: 154 mg/dL — ABNORMAL HIGH (ref 65–99)
Potassium: 4.5 mmol/L (ref 3.5–5.2)
Sodium: 139 mmol/L (ref 134–144)

## 2020-01-22 LAB — BAYER DCA HB A1C WAIVED: HB A1C (BAYER DCA - WAIVED): 7.3 % — ABNORMAL HIGH (ref <7.0)

## 2020-01-22 NOTE — Patient Instructions (Addendum)
A1c is 7.3.   I want you to cut the night snack out.  I think you can achieve your goal by getting rid of some sugar/ carbs (bread, pasta, rice, potatoes, sugary drinks including juice/ tea).  If not able to achieve A1c below 7 by next visit, will plan to increase Ozempic dose.  Goal blood sugar is less than 150.

## 2020-02-24 ENCOUNTER — Other Ambulatory Visit: Payer: Self-pay | Admitting: Family Medicine

## 2020-02-24 DIAGNOSIS — E785 Hyperlipidemia, unspecified: Secondary | ICD-10-CM

## 2020-02-24 DIAGNOSIS — E1169 Type 2 diabetes mellitus with other specified complication: Secondary | ICD-10-CM

## 2020-02-29 ENCOUNTER — Telehealth: Payer: Medicare Other | Admitting: *Deleted

## 2020-02-29 ENCOUNTER — Telehealth: Payer: Self-pay | Admitting: *Deleted

## 2020-02-29 NOTE — Telephone Encounter (Signed)
  Chronic Care Management   Outreach Note  02/29/2020 Name: Adrienne Davidson MRN: [id number] DOB: 08-Nov-1955  Referred by: Janora Norlander, DO Reason for referral : No chief complaint on file.   An unsuccessful Initial Telephone Visit was attempted today. The patient was referred to the case management team for assistance with care management and care coordination.   Clinical Goals: . Over the next 30 days, patient will be contacted by a Care Guide to reschedule their Initial CCM Visit . Over the next 30 days, patient will have an Initial CCM Visit with a member of the embedded CCM team to discuss self-management of their chronic medical conditions  Interventions and Plan . Chart reviewed in preparation for initial visit telephone call . Collaboration with other care team members as needed . Unsuccessful outreach to patient  . Request sent to care guides to reach out and reschedule patient's initial visit   Chong Sicilian, BSN, RN-BC [place] [facility] [place] Management Direct Dial: [phone]

## 2020-03-25 ENCOUNTER — Telehealth: Payer: Self-pay | Admitting: *Deleted

## 2020-03-25 NOTE — Telephone Encounter (Signed)
R/s

## 2020-03-25 NOTE — Chronic Care Management (AMB) (Signed)
  Care Management   Note  03/25/2020 Name: Adrienne Davidson MRN: [id number] DOB: February 13, 1956  Adrienne Davidson is a 64 y.o. year old female who is a primary care patient of Janora Norlander, DO and is actively engaged with the care management team. I reached out to [name] by phone today to assist with re-scheduling a follow up visit with the RN Case Manager  Follow up plan: Unsuccessful telephone outreach attempt made. The care management team will reach out to the patient again over the next 7 days. If patient returns call to provider office, please advise to call [facility] [place] at [phone].  [place] [facility] Management

## 2020-03-29 NOTE — Chronic Care Management (AMB) (Signed)
  Care Management   Note  03/29/2020 Name: Adrienne Davidson MRN: [id number] DOB: Jan 29, 1956  Adrienne Davidson is a 64 y.o. year old female who is a primary care patient of Janora Norlander, DO and is actively engaged with the care management team. I reached out to [name] by phone today to assist with re-scheduling a follow up visit with the RN Case Manager  Follow up plan: Unsuccessful telephone outreach attempt made. A HIPAA compliant phone message was left for the patient providing contact information and requesting a return call.  The care management team will reach out to the patient again over the next 7 days.  If patient returns call to provider office, please advise to call [place] at [phone].  [place] Management

## 2020-04-02 DIAGNOSIS — K1120 Sialoadenitis, unspecified: Secondary | ICD-10-CM | POA: Diagnosis not present

## 2020-04-04 ENCOUNTER — Ambulatory Visit: Payer: Medicare Other | Admitting: Pharmacist

## 2020-04-05 NOTE — Chronic Care Management (AMB) (Signed)
°  Care Management   Note  04/05/2020 Name: KIARRAH RAUSCH MRN: [id number] DOB: [birth year]  Adrienne Davidson is a 64 y.o. year old female who is a primary care patient of Janora Norlander, DO and is actively engaged with the care management team. I reached out to [name] by phone today to assist with re-scheduling a follow up visit with the RN Case Manager.  Follow up plan: Telephone appointment with care management team member scheduled for: 04/20/2020  [place] Management

## 2020-04-05 NOTE — Telephone Encounter (Signed)
Rescheduled for follow up call with Edwena Bunde on 04/20/2020

## 2020-04-08 ENCOUNTER — Other Ambulatory Visit: Payer: Self-pay | Admitting: Family Medicine

## 2020-04-08 DIAGNOSIS — E1159 Type 2 diabetes mellitus with other circulatory complications: Secondary | ICD-10-CM

## 2020-04-11 ENCOUNTER — Other Ambulatory Visit: Payer: Self-pay | Admitting: Family Medicine

## 2020-04-11 DIAGNOSIS — E1122 Type 2 diabetes mellitus with diabetic chronic kidney disease: Secondary | ICD-10-CM

## 2020-04-12 ENCOUNTER — Other Ambulatory Visit: Payer: Self-pay | Admitting: Family Medicine

## 2020-04-12 DIAGNOSIS — N183 Chronic kidney disease, stage 3 unspecified: Secondary | ICD-10-CM

## 2020-04-20 ENCOUNTER — Ambulatory Visit: Payer: Medicare Other | Admitting: *Deleted

## 2020-04-20 DIAGNOSIS — E1159 Type 2 diabetes mellitus with other circulatory complications: Secondary | ICD-10-CM

## 2020-04-20 DIAGNOSIS — E1165 Type 2 diabetes mellitus with hyperglycemia: Secondary | ICD-10-CM

## 2020-04-20 NOTE — Chronic Care Management (AMB) (Signed)
Chronic Care Management   Follow Up Note   04/20/2020 Name: Adrienne Davidson MRN: [id number] DOB: 28-Sep-1955  Referred by: Raliegh Ip, DO Reason for referral : Chronic Care Management (RN follow up)   Adrienne Davidson is a 64 y.o. year old female who is a primary care patient of Raliegh Ip, DO. The CCM team was consulted for assistance with chronic disease management and care coordination needs.    Review of patient status, including review of consultants reports, relevant laboratory and other test results, and collaboration with appropriate care team members and the patient's provider was performed as part of comprehensive patient evaluation and provision of chronic care management services.    SDOH (Social Determinants of Health) assessments performed: No See Care Plan activities for detailed interventions related to [facility])     Outpatient Encounter Medications as of 04/20/2020  Medication Sig  . amLODipine (NORVASC) 5 MG tablet Take 1 tablet (5 mg total) by mouth daily.  Marland Kitchen aspirin (ASPIRIN 81) 81 MG EC tablet Take 81 mg by mouth daily. Swallow whole.  . calcipotriene (DOVONOX) 0.005 % cream Apply 1 application topically 2 (two) times daily.  . Coenzyme Q10 100 MG capsule Take by mouth.  . Cranberry 250 MG CAPS Take by mouth.  . EPINEPHRINE 0.3 mg/0.3 mL IJ SOAJ injection Inject 0.3 mLs (0.3 mg total) into the muscle as needed for anaphylaxis. Then call 911  . glucose blood test strip Test blood sugar once daily (one touch verio if covered by ins/ otherwise whatever ins preference is) E11.65  . Krill Oil 500 MG CAPS Take by mouth.  . Lancets (ONETOUCH ULTRASOFT) lancets Use once daily as instructed E11.65 (one touch verio)  . metFORMIN (GLUCOPHAGE) 1000 MG tablet Take 1 tablet (1,000 mg total) by mouth 2 (two) times daily with a meal.  . Multiple Vitamins-Minerals (CENTRUM ADULTS) TABS Take by mouth.  . rosuvastatin (CRESTOR) 10 MG tablet Take 1 tablet (5 mg) by MOUTH  once daily  . Semaglutide, 1 MG/DOSE, (OZEMPIC, 1 MG/DOSE,) 2 MG/1.5ML SOPN Inject 0.75 mLs (1 mg total) into the skin once a week.   No facility-administered encounter medications on file as of 04/20/2020.     Objective:  BP Readings from Last 3 Encounters:  01/22/20 122/66  10/23/19 139/84  07/15/19 120/72   Lab Results  Component Value Date   HGBA1C 7.3 (H) 01/22/2020   HGBA1C 7.5 (H) 10/23/2019   HGBA1C 7.8 (H) 07/15/2019   Lab Results  Component Value Date   LDLCALC 118 (H) 04/29/2019   CREATININE 0.93 01/22/2020      Patient Care Plan: Low Back/Right Hip Pain    Problem Identified: Worsening Lower Back/Right Hip Pain   Priority: Medium    Goal: Manage Pain   This Visit's Progress: Not on track  Priority: Medium  Note:   Current Barriers:  . Care Coordination needs related to lower back and right hip pain in a patient with diabetes and hypertension  Nurse Case Manager Clinical Goal(s):  Marland Kitchen Over the next 30 days, patient will verbalize understanding of plan for back and hip pain management . Over the next 30 days, patient will work with PCP to address needs related to lower back and hip pain  Interventions:  . 1:1 collaboration with Raliegh Ip, DO regarding development and update of comprehensive plan of care as evidenced by provider attestation and co-signature . Inter-disciplinary care team collaboration (see longitudinal plan of care) . Chart reviewed including relevant office  notes and lab results . Medications reviewed and discussed with patient . Discussed HPI: Chronic low back pain/right hip pain o chronic low back pain with worsening right hip pain over the past few months. Has not been evaluated. o Taking Aleve PRN but not daily o Walking makes pain worse o Resting helps o Has used heat patches which have helped some . Encouraged patient to discuss pain with Dr Nadine Counts at visit on 04/26/2020 . Discussed use of NSAIDS in patients with DM and  HTN o Advised that she can take PRN but not daily o Encouraged to discuss with PCP at visit on 04/26/20 . Encouraged to reach out to PCP with any new or worsening symptoms . Provided with RN Care Manager contact number and encouraged to reach out as needed  Patient Goals/Self-Care Activities Over the next 45 days, patient will: - prioritize tasks for the day - track times pain is worst and when it is best - track what makes the pain worse and what makes it better - use ice or heat for pain relief - work slower and less intense when having pain  - talk with PCP about back and hip pain at appointment on 04/26/20 - Call PCP with any new or worsening symptoms    Patient Care Plan: Diabetes Type 2 (Adult)    Problem Identified: Glycemic Management (Diabetes, Type 2)   Priority: Medium    Long-Range Goal: Glycemic Management Optimized   This Visit's Progress: Not on track  Priority: Medium  Note:   Current Barriers:  . Chronic Disease Management support and education needs related to diabetes  Nurse Case Manager Clinical Goal(s):  Marland Kitchen Over the next 45 days, patient will verbalize understanding of plan for diabetes management . Over the next 45 days, patient will meet with RN Care Manager to address self-management of diabetes  Interventions:  . 1:1 collaboration with Raliegh Ip, DO regarding development and update of comprehensive plan of care as evidenced by provider attestation and co-signature . Inter-disciplinary care team collaboration (see longitudinal plan of care) . Evaluation of current treatment plan related to diabetes and patient's adherence to plan as established by provider. . Chart reviewed including relevant office notes and lab results . Reviewed and discussed A1C result of 7.3 and target of <7 . Reviewed and discussed medications o Metformin o Ozempic- decreased appetite since starting . Discussed diet o Eating breakfast, light lunch, and supper. Still has a  snack before bed on occasion but not daily.  o Encouraged to d/c snack before bed per Dr Delynn Flavin recommendation o Acknowledged that it is harder to stick to a planned diet during the Holidays o Encouraged to plan and eat meals regularly o Carb modified diet . Discussed activity level o Current activity level affected by lower back/right hip pain . Reviewed upcoming appointment with Dr Nadine Counts on 04/26/20 . Encouraged patient to check blood sugar daily and to reach out to PCP with any readings outside of recommended range . Provided with RN Care manager contact number and encouraged to reach out as needed  Patient Goals/Self-Care Activities Over the next 45 days, patient will: - check blood sugar at prescribed times - check blood sugar if I feel it is too high or too low - enter blood sugar readings and medication or insulin into daily log - take the blood sugar log to all doctor visits - take the blood sugar meter to all doctor visits  - Carb modified diet - Plan meals and  do not skip meals - no snacking before bed - Call PCP with any blood sugar readings outside of recommended range    Patient Care Plan: Hypertension (Adult)    Problem Identified: Hypertension (Hypertension)   Priority: Medium    Long-Range Goal: Hypertension Monitored   This Visit's Progress: Not on track  Priority: Medium  Note:   Current Barriers:  . Chronic Disease Management support and education needs related to hypertension  Nurse Case Manager Clinical Goal(s):  Marland Kitchen Over the next 45 days, patient will verbalize understanding of plan for hypertension management . Over the next 45 days, patient will meet with RN Care Manager to address self-management of hypertension  Interventions:  . 1:1 collaboration with Janora Norlander, DO regarding development and update of comprehensive plan of care as evidenced by provider attestation and co-signature . Inter-disciplinary care team collaboration (see  longitudinal plan of care) . Evaluation of current treatment plan related to hypertension and patient's adherence to plan as established by provider. . Chart reviewed including relevant office notes and lab results o Hx of angioedema r/t to use of ACE Inhibitor . Reviewed and discussed medications with patient . Encouraged patient to check and record blood pressure 3 times a week . Advised patient to call PCP with any readings outside of recommended range . Reviewed upcoming appt with Dr Lajuana Ripple on 04/26/20 . Provided with RN Care Manager contact number and encouraged to reach out as needed  Patient Goals/Self-Care Activities Over the next 45 days, patient will: - check blood pressure 3 times per week - write blood pressure results in a log or diary  - Call PCP with any blood pressure readings outside of recommended range - take medications as prescribed - keep appt with Dr Lajuana Ripple on 04/26/20      Plan:  Telephone follow up appointment with care management team member scheduled for: 05/30/20 with RN Care Manager The patient has been provided with contact information for the care management team and has been advised to call with any health related questions or concerns.  Next PCP appointment scheduled for: Dr Lajuana Ripple 04/26/20  Chong Sicilian, BSN, RN-BC [place] [facility] [place] Management Direct Dial: [phone]

## 2020-04-20 NOTE — Patient Instructions (Signed)
Visit Information  Goals Addressed            This Visit's Progress    Control Low Back Pain/Right Hip Pain       Timeframe:  Short-Term Goal Priority:  Medium Start Date:                             Expected End Date:                       Follow Up Date 05/30/2020    - prioritize tasks for the day - track times pain is worst and when it is best - track what makes the pain worse and what makes it better - use ice or heat for pain relief - work slower and less intense when having pain  - talk with PCP about back and hip pain at appointment on 04/26/20 - Call PCP with any new or worsening symptoms   Why is this important?    Day-to-day life can be hard when you have back pain.   Pain medicine is just one piece of the treatment puzzle. There are many things you can do to manage pain and keep your back strong.    Lifestyle changes, like stopping smoking and eating foods with Vitamin D and calcium, keep your bones and muscles healthy. Your back is better when it is supported by strong muscles.   You can try these action steps to help you manage your pain.     Notes:      Monitor and Manage My Blood Sugar-Diabetes Type 2       Timeframe:  Long-Range Goal Priority:  Medium Start Date:                             Expected End Date:                       Follow Up Date 05/30/20    - check blood sugar at prescribed times - check blood sugar if I feel it is too high or too low - enter blood sugar readings and medication or insulin into daily log - take the blood sugar log to all doctor visits - take the blood sugar meter to all doctor visits  - Carb modified diet - Plan meals and do not skip meals - no snacking before bed - Call PCP with any blood sugar readings outside of recommended range    Why is this important?    Checking your blood sugar at home helps to keep it from getting very high or very low.   Writing the results in a diary or log helps the doctor know how to  care for you.   Your blood sugar log should have the time, date and the results.   Also, write down the amount of insulin or other medicine that you take.   Other information, like what you ate, exercise done and how you were feeling, will also be helpful.     Notes:      Track and Manage My Blood Pressure-Hypertension       Timeframe:  Long-Range Goal Priority:  Medium Start Date:                             Expected End Date:  Follow Up Date 05/30/20    - check blood pressure 3 times per week - write blood pressure results in a log or diary  - Call PCP with any blood pressure readings outside of recommended range - take medications as prescribed - keep appt with Dr Nadine Counts on 04/26/20   Why is this important?    You won't feel high blood pressure, but it can still hurt your blood vessels.   High blood pressure can cause heart or kidney problems. It can also cause a stroke.   Making lifestyle changes like losing a little weight or eating less salt will help.   Checking your blood pressure at home and at different times of the day can help to control blood pressure.   If the doctor prescribes medicine remember to take it the way the doctor ordered.   Call the office if you cannot afford the medicine or if there are questions about it.     Notes:        The patient verbalized understanding of instructions, educational materials, and care plan provided today and declined offer to receive copy of patient instructions, educational materials, and care plan.   Follow-up Plan:  Telephone follow up appointment with care management team member scheduled for: 05/30/20 with RN Care Manager The patient has been provided with contact information for the care management team and has been advised to call with any health related questions or concerns.  Next PCP appointment scheduled for: Dr Nadine Counts 04/26/20  Demetrios Loll, BSN, RN-BC Embedded Chronic Care  Manager [facility] Care Management Direct Dial: [phone]

## 2020-04-26 ENCOUNTER — Other Ambulatory Visit: Payer: Self-pay

## 2020-04-26 ENCOUNTER — Ambulatory Visit (INDEPENDENT_AMBULATORY_CARE_PROVIDER_SITE_OTHER): Payer: Medicare Other | Admitting: Family Medicine

## 2020-04-26 ENCOUNTER — Ambulatory Visit: Payer: Medicare Other | Admitting: Family Medicine

## 2020-04-26 VITALS — BP 127/70 | HR 82 | Temp 97.5°F | Ht 63.0 in | Wt 178.0 lb

## 2020-04-26 DIAGNOSIS — E1159 Type 2 diabetes mellitus with other circulatory complications: Secondary | ICD-10-CM | POA: Diagnosis not present

## 2020-04-26 DIAGNOSIS — E1165 Type 2 diabetes mellitus with hyperglycemia: Secondary | ICD-10-CM

## 2020-04-26 DIAGNOSIS — I152 Hypertension secondary to endocrine disorders: Secondary | ICD-10-CM | POA: Diagnosis not present

## 2020-04-26 DIAGNOSIS — E785 Hyperlipidemia, unspecified: Secondary | ICD-10-CM | POA: Diagnosis not present

## 2020-04-26 DIAGNOSIS — Z87898 Personal history of other specified conditions: Secondary | ICD-10-CM | POA: Diagnosis not present

## 2020-04-26 DIAGNOSIS — E1169 Type 2 diabetes mellitus with other specified complication: Secondary | ICD-10-CM

## 2020-04-26 LAB — LIPID PANEL
Chol/HDL Ratio: 2.8 ratio (ref 0.0–4.4)
Cholesterol, Total: 138 mg/dL (ref 100–199)
HDL: 49 mg/dL (ref >39)
LDL Chol Calc (NIH): 59 mg/dL (ref 0–99)
Triglycerides: 184 mg/dL — ABNORMAL HIGH (ref 0–149)
VLDL Cholesterol Cal: 30 mg/dL (ref 5–40)

## 2020-04-26 LAB — CBC WITH DIFFERENTIAL/PLATELET
Basophils Absolute: 0 10*3/uL (ref 0.0–0.2)
Basos: 1 %
EOS (ABSOLUTE): 0.2 10*3/uL (ref 0.0–0.4)
Eos: 3 %
Hematocrit: 38.2 % (ref 34.0–46.6)
Hemoglobin: 12.6 g/dL (ref 11.1–15.9)
Immature Grans (Abs): 0 10*3/uL (ref 0.0–0.1)
Immature Granulocytes: 0 %
Lymphocytes Absolute: 2.2 10*3/uL (ref 0.7–3.1)
Lymphs: 32 %
MCH: 27.8 pg (ref 26.6–33.0)
MCHC: 33 g/dL (ref 31.5–35.7)
MCV: 84 fL (ref 79–97)
Monocytes Absolute: 0.5 10*3/uL (ref 0.1–0.9)
Monocytes: 6 %
Neutrophils Absolute: 4.1 10*3/uL (ref 1.4–7.0)
Neutrophils: 58 %
Platelets: 294 10*3/uL (ref 150–450)
RBC: 4.54 x10E6/uL (ref 3.77–5.28)
RDW: 13.9 % (ref 11.7–15.4)
WBC: 7 10*3/uL (ref 3.4–10.8)

## 2020-04-26 LAB — CMP14+EGFR
ALT: 12 IU/L (ref 0–32)
AST: 16 IU/L (ref 0–40)
Albumin/Globulin Ratio: 1.8 (ref 1.2–2.2)
Albumin: 4.2 g/dL (ref 3.8–4.8)
Alkaline Phosphatase: 75 IU/L (ref 44–121)
BUN/Creatinine Ratio: 15 (ref 12–28)
BUN: 13 mg/dL (ref 8–27)
Bilirubin Total: 0.5 mg/dL (ref 0.0–1.2)
CO2: 23 mmol/L (ref 20–29)
Calcium: 9.8 mg/dL (ref 8.7–10.3)
Chloride: 102 mmol/L (ref 96–106)
Creatinine, Ser: 0.84 mg/dL (ref 0.57–1.00)
GFR calc Af Amer: 84 mL/min/{1.73_m2} (ref >59)
GFR calc non Af Amer: 73 mL/min/{1.73_m2} (ref >59)
Globulin, Total: 2.4 g/dL (ref 1.5–4.5)
Glucose: 136 mg/dL — ABNORMAL HIGH (ref 65–99)
Potassium: 4.6 mmol/L (ref 3.5–5.2)
Sodium: 140 mmol/L (ref 134–144)
Total Protein: 6.6 g/dL (ref 6.0–8.5)

## 2020-04-26 LAB — BAYER DCA HB A1C WAIVED: HB A1C (BAYER DCA - WAIVED): 7.2 % — ABNORMAL HIGH (ref <7.0)

## 2020-04-26 MED ORDER — EPINEPHRINE 0.3 MG/0.3ML IJ SOAJ
0.3000 mg | INTRAMUSCULAR | 0 refills | Status: DC | PRN
Start: 2020-04-26 — End: 2022-06-13

## 2020-04-26 MED ORDER — OZEMPIC (1 MG/DOSE) 2 MG/1.5ML ~~LOC~~ SOPN: 1.0000 mg | PEN_INJECTOR | SUBCUTANEOUS | 0 refills | Status: DC

## 2020-04-26 NOTE — Progress Notes (Signed)
Subjective: CC: DM PCP: Janora Norlander, DO XAJ:OINOM T Mellone is a 65 y.o. female presenting to clinic today for:  1. Type 2 Diabetes with hypertension, hyperlipidemia:  Patient reports compliance with her Ozempic 1 mg subcu each week, Metformin 1000 mg, Norvasc 5 mg, Crestor 10 mg.  She unfortunately ran out of her own supply of Ozempic and has been using her husband's.  She is awaiting on the patient assistance shipment, and.  Blood sugars have been fairly good.  She does admit that she had prednisone for 5 days due to a parotiditis that occurred over Christmas holiday.  She has been losing weight and reports that appetite has been well controlled with Ozempic.  No reports of chest pain, shortness of breath, polydipsia or polyuria.  Last eye exam: Needs Last foot exam: Needs Last A1c:  Lab Results  Component Value Date   HGBA1C 7.3 (H) 01/22/2020   Nephropathy screen indicated?:  On ACE inhibitor Last flu, zoster and/or pneumovax:  Immunization History  Administered Date(s) Administered  . Fluad Quad(high Dose 65+) 01/22/2019, 01/22/2020  . Influenza Split 02/06/2017  . Influenza,inj,Quad PF,6+ Mos 12/20/2017  . Moderna Sars-Covid-2 Vaccination 05/20/2019, 06/17/2019  . Pneumococcal Conjugate-13 10/27/2018  . Pneumococcal Polysaccharide-23 12/20/2015  . Zoster Recombinat (Shingrix) 10/27/2018, 01/01/2019    ROS: Per HPI  Allergies  Allergen Reactions  . Enalapril Anaphylaxis    Angioedema Aug 2018   Past Medical History:  Diagnosis Date  . Allergic rhinitis   . Diabetes mellitus ([place])   . Hyperlipidemia   . Hypertension   . Psoriasis    Bilateral feet  . Sleep apnea   . Sleep apnea   . Vitamin D deficiency     Current Outpatient Medications:  .  amLODipine (NORVASC) 5 MG tablet, Take 1 tablet (5 mg total) by mouth daily., Disp: 90 tablet, Rfl: 0 .  aspirin (ASPIRIN 81) 81 MG EC tablet, Take 81 mg by mouth daily. Swallow whole., Disp: , Rfl:  .   calcipotriene (DOVONOX) 0.005 % cream, Apply 1 application topically 2 (two) times daily., Disp: , Rfl:  .  Coenzyme Q10 100 MG capsule, Take by mouth., Disp: , Rfl:  .  Cranberry 250 MG CAPS, Take by mouth., Disp: , Rfl:  .  EPINEPHRINE 0.3 mg/0.3 mL IJ SOAJ injection, Inject 0.3 mLs (0.3 mg total) into the muscle as needed for anaphylaxis. Then call 911, Disp: 2 each, Rfl: 0 .  glucose blood test strip, Test blood sugar once daily (one touch verio if covered by ins/ otherwise whatever ins preference is) E11.65, Disp: 100 each, Rfl: 12 .  Krill Oil 500 MG CAPS, Take by mouth., Disp: , Rfl:  .  Lancets (ONETOUCH ULTRASOFT) lancets, Use once daily as instructed E11.65 (one touch verio), Disp: 100 each, Rfl: 12 .  metFORMIN (GLUCOPHAGE) 1000 MG tablet, Take 1 tablet (1,000 mg total) by mouth 2 (two) times daily with a meal., Disp: 180 tablet, Rfl: 0 .  Multiple Vitamins-Minerals (CENTRUM ADULTS) TABS, Take by mouth., Disp: , Rfl:  .  rosuvastatin (CRESTOR) 10 MG tablet, Take 1 tablet (5 mg) by MOUTH once daily, Disp: 90 tablet, Rfl: 3 .  Semaglutide, 1 MG/DOSE, (OZEMPIC, 1 MG/DOSE,) 2 MG/1.5ML SOPN, Inject 0.75 mLs (1 mg total) into the skin once a week., Disp: 3 pen, Rfl: 4 Social History   Socioeconomic History  . Marital status: Married    Spouse name: [name]  . Number of children: 2  . Years of education:  12  . Highest education level: Associate degree: occupational, Hotel manager, or vocational program  Occupational History  . Not on file  Tobacco Use  . Smoking status: Former Smoker    Packs/day: 0.25    Years: 25.00    Pack years: 6.25    Types: Cigarettes    Quit date: 06/27/2018    Years since quitting: 1.8  . Smokeless tobacco: Never Used  Vaping Use  . Vaping Use: Never used  Substance and Sexual Activity  . Alcohol use: Never  . Drug use: Never  . Sexual activity: Not on file  Other Topics Concern  . Not on file  Social History Narrative  . Not on file   Social  Determinants of Health   Financial Resource Strain: Low Risk   . Difficulty of Paying Living Expenses: Not hard at all  Food Insecurity: No Food Insecurity  . Worried About Charity fundraiser in the Last Year: Never true  . Ran Out of Food in the Last Year: Never true  Transportation Needs: No Transportation Needs  . Lack of Transportation (Medical): No  . Lack of Transportation (Non-Medical): No  Physical Activity: Inactive  . Days of Exercise per Week: 0 days  . Minutes of Exercise per Session: 0 min  Stress: No Stress Concern Present  . Feeling of Stress : Only a little  Social Connections: Socially Integrated  . Frequency of Communication with Friends and Family: More than three times a week  . Frequency of Social Gatherings with Friends and Family: More than three times a week  . Attends Religious Services: More than 4 times per year  . Active Member of Clubs or Organizations: Yes  . Attends Archivist Meetings: More than 4 times per year  . Marital Status: Married  Human resources officer Violence: Not At Risk  . Fear of Current or Ex-Partner: No  . Emotionally Abused: No  . Physically Abused: No  . Sexually Abused: No   Family History  Problem Relation Age of Onset  . Heart disease Father   . Non-Hodgkin's lymphoma Mother     Objective: Office vital signs reviewed. BP 127/70   Pulse 82   Temp (!) 97.5 F (36.4 C) (Temporal)   Ht _0  (1.6 m)   Wt 178 lb (80.7 kg)   SpO2 100%   BMI 31.53 kg/m   Physical Examination:  General: Awake, alert, well nourished, No acute distress HEENT: Normal; sclera white.  Moist mucous membranes Cardio: regular rate and rhythm, S1S2 heard, no murmurs appreciated Pulm: clear to auscultation bilaterally, no wheezes, rhonchi or rales; normal work of breathing on room air Extremities: warm, well perfused, No edema, cyanosis or clubbing; +2 pulses bilaterally  Assessment/ Plan: 65 y.o. female   Uncontrolled type 2 diabetes  mellitus with hyperglycemia ([place]) - Plan: Bayer DCA Hb A1c Waived, Semaglutide, 1 MG/DOSE, (OZEMPIC, 1 MG/DOSE,) 2 MG/1.5ML SOPN  Hyperlipidemia associated with type 2 diabetes mellitus ([place]) - Plan: Lipid panel  Hypertension associated with diabetes ([place]) - Plan: CBC with Differential/Platelet, CMP14+EGFR  History of angioedema - Plan: EPINEPHrine 0.3 mg/0.3 mL IJ SOAJ injection  Not at goal with A1c of 7.2 but she has had prednisone recently.  No changes to current medication regimen.  I advised her to continue monitoring blood sugars closely.  I have sent in a bridge supply of Ozempic as her shipment is not anticipated to the end of the month.  If she has ongoing elevation in blood sugar, we  may need to consider addition of low-dose glipizide to her regimen.  She will continue her statin, current blood pressure medications.  Plan for fasting labs today  EpiPen was also renewed as hers was currently out of date  Orders Placed This Encounter  Procedures  . Bayer DCA Hb A1c Waived  . Lipid panel  . CBC with Differential/Platelet  . CMP14+EGFR   Meds ordered this encounter  Medications  . EPINEPHrine 0.3 mg/0.3 mL IJ SOAJ injection    Sig: Inject 0.3 mg into the muscle as needed for anaphylaxis. Then call 911    Dispense:  2 each    Refill:  0  . Semaglutide, 1 MG/DOSE, (OZEMPIC, 1 MG/DOSE,) 2 MG/1.5ML SOPN    Sig: Inject 1 mg into the skin once a week.    Dispense:  3 mL    Refill:  [place], DO [place] [phone]

## 2020-04-26 NOTE — Patient Instructions (Signed)
Bridge Ozempic pack sent to pharmacy.  Adrienne Davidson said the copay should have reset.  Your order should be available sometime at the end of January.  New epipen also sent.

## 2020-05-10 LAB — HM DIABETES EYE EXAM

## 2020-05-24 ENCOUNTER — Other Ambulatory Visit: Payer: Self-pay | Admitting: Family Medicine

## 2020-05-24 DIAGNOSIS — E1165 Type 2 diabetes mellitus with hyperglycemia: Secondary | ICD-10-CM

## 2020-05-30 ENCOUNTER — Ambulatory Visit (INDEPENDENT_AMBULATORY_CARE_PROVIDER_SITE_OTHER): Payer: Medicare Other | Admitting: *Deleted

## 2020-05-30 DIAGNOSIS — E1159 Type 2 diabetes mellitus with other circulatory complications: Secondary | ICD-10-CM

## 2020-05-30 DIAGNOSIS — E1165 Type 2 diabetes mellitus with hyperglycemia: Secondary | ICD-10-CM

## 2020-05-30 DIAGNOSIS — I152 Hypertension secondary to endocrine disorders: Secondary | ICD-10-CM | POA: Diagnosis not present

## 2020-05-30 NOTE — Chronic Care Management (AMB) (Signed)
Chronic Care Management   CCM RN Visit Note  05/30/2020 Name: Adrienne Davidson MRN: [id number] DOB: 14-Nov-1955  Subjective: Adrienne Davidson is a 65 y.o. year old female who is a primary care patient of Janora Norlander, DO. The care management team was consulted for assistance with disease management and care coordination needs.    Engaged with patient by telephone for follow up visit in response to provider referral for case management and/or care coordination services.   Consent to Services:  The patient was given information about Chronic Care Management services, agreed to services, and gave verbal consent prior to initiation of services.  Please see initial visit note for detailed documentation.   Patient agreed to services and verbal consent obtained.   Assessment: Review of patient past medical history, allergies, medications, health status, including review of consultants reports, laboratory and other test data, was performed as part of comprehensive evaluation and provision of chronic care management services.   SDOH (Social Determinants of Health) assessments and interventions performed:    CCM Care Plan  Allergies  Allergen Reactions  . Enalapril Anaphylaxis    Angioedema Aug 2018    Outpatient Encounter Medications as of 05/30/2020  Medication Sig  . amLODipine (NORVASC) 5 MG tablet Take 1 tablet (5 mg total) by mouth daily.  Marland Kitchen aspirin 81 MG EC tablet Take 81 mg by mouth daily. Swallow whole.  . calcipotriene (DOVONOX) 0.005 % cream Apply 1 application topically 2 (two) times daily.  . Coenzyme Q10 100 MG capsule Take by mouth.  . Cranberry 250 MG CAPS Take by mouth.  . EPINEPHrine 0.3 mg/0.3 mL IJ SOAJ injection Inject 0.3 mg into the muscle as needed for anaphylaxis. Then call 911  . glucose blood test strip Test blood sugar once daily (one touch verio if covered by ins/ otherwise whatever ins preference is) E11.65  . Krill Oil 500 MG CAPS Take by mouth.  . Lancets  (ONETOUCH ULTRASOFT) lancets Use once daily as instructed E11.65 (one touch verio)  . metFORMIN (GLUCOPHAGE) 1000 MG tablet Take 1 tablet (1,000 mg total) by mouth 2 (two) times daily with a meal.  . Multiple Vitamins-Minerals (CENTRUM ADULTS) TABS Take by mouth.  Marland Kitchen OZEMPIC, 1 MG/DOSE, 4 MG/3ML SOPN Inject 1 mg into the skin oncea week.  . rosuvastatin (CRESTOR) 10 MG tablet Take 1 tablet (5 mg) by MOUTH once daily   No facility-administered encounter medications on file as of 05/30/2020.    Patient Active Problem List   Diagnosis Date Noted  . Adverse reaction to ACE inhibitor drug 12/19/2015  . Angioedema 12/19/2015  . Diabetes mellitus ([place]) 12/19/2015  . Hypertension associated with diabetes ([place]) 12/19/2015  . OSA on CPAP 12/19/2015    Conditions to be addressed/monitored:DM, HTN, HLD, OSA  Care Plan : Low Back/Right Hip Pain  Updates made by Ilean China, RN since 05/30/2020 12:00 AM    Problem: Worsening Lower Back/Right Hip Pain   Priority: Medium    Goal: Manage Pain   This Visit's Progress: Not on track  Recent Progress: Not on track  Priority: Medium  Note:   Current Barriers:  . Care Coordination needs related to lower back and right hip pain in a patient with diabetes and hypertension  Nurse Case Manager Clinical Goal(s):  Marland Kitchen Over the next 60 days, patient will work with PCP to address needs related to lower back and hip pain  Interventions:  . 1:1 collaboration with Janora Norlander, DO regarding development and  update of comprehensive plan of care as evidenced by provider attestation and co-signature . Inter-disciplinary care team collaboration (see longitudinal plan of care) . Chart reviewed including relevant office notes and lab results . Medications reviewed and discussed with patient . Discussed HPI: Chronic low back pain/right hip pain o chronic low back pain with worsening right hip pain over the past few months. Has not been evaluated. o Has  psoriasis and is concerned about possible psoriatic arthritis  o Taking Aleve PRN but not daily o Walking makes pain worse o Resting helps o Has used heat patches which have helped some . Encouraged patient to discuss pain with Dr Lajuana Ripple at visit on 07/25/20 . Discussed use of NSAIDS in patients with DM and HTN o Advised that she can take PRN but not daily o Encouraged to discuss with PCP at visit on 07/25/20 . Encouraged to reach out to PCP with any new or worsening symptoms . Provided with RN Care Manager contact number and encouraged to reach out as needed  Patient Goals/Self-Care Activities Over the next 60 days, patient will: . prioritize tasks for the day . track times pain is worst and when it is best . track what makes the pain worse and what makes it better . use ice or heat for pain relief . work slower and less intense when having pain  . talk with PCP about back and hip pain at appointment on 04/26/20 . Call PCP with any new or worsening symptoms     Care Plan : RNCM: Diabetes Type 2 (Adult)  Updates made by Ilean China, RN since 05/30/2020 12:00 AM    Problem: Glycemic Management (Diabetes, Type 2)   Priority: Medium    Long-Range Goal: Glycemic Management Optimized   This Visit's Progress: On track  Recent Progress: Not on track  Priority: Medium  Note:   Current Barriers:  . Chronic Disease Management support and education needs related to diabetes  Nurse Case Manager Clinical Goal(s):  Marland Kitchen Over the next 60 days, patient will meet with RN Care Manager to address self-management of diabetes . Over the next 90 days, the patient will demonstrate ongoing self health care management ability as evidenced by reducing A1C to <7*  Interventions:  . 1:1 collaboration with Janora Norlander, DO regarding development and update of comprehensive plan of care as evidenced by provider attestation and co-signature . Inter-disciplinary care team collaboration (see  longitudinal plan of care) . Evaluation of current treatment plan related to diabetes and patient's adherence to plan as established by provider. . Chart reviewed including relevant office notes and lab results . Reviewed and discussed A1C result of 7.3 and target of <7 . Discussed recent use of prednisone and affect on blood sugar . Reviewed and discussed medications o Metformin o Ozempic- appetite better controlled since starting o Approved for Rx assistance through [employer] but it has not been shipped to [facility] for pickup - Provided with PAP telephone number [phone] for patient to check on status of shipment o Has received some samples and did purchase from pharmacy this month . Discussed diet o Eating breakfast, light lunch or snack, and supper o Encouraged to plan and eat meals regularly o Carb modified diet . Discussed activity level o Current activity level affected by lower back/right hip pain . Reviewed upcoming appointment with Dr Lajuana Ripple on 07/25/20 . Encouraged patient to check blood sugar daily and to reach out to PCP with any readings outside of recommended range .  Provided with RN Care manager contact number and encouraged to reach out as needed  Patient Goals/Self-Care Activities Over the next 60 days, patient will: . check blood sugar at prescribed times . check blood sugar if I feel it is too high or too low . enter blood sugar readings and medication or insulin into daily log . take the blood sugar log to all doctor visits . take the blood sugar meter to all doctor visits  . Carb modified diet . Plan meals and do not skip meals . no snacking before bed . Call PCP with any blood sugar readings outside of recommended range . Call [employer] to check on prescription assistance [place] : RNCM: Hypertension (Adult)  Updates made by Ilean China, RN since 05/30/2020 12:00 AM    Problem: Hypertension (Hypertension)   Priority: Medium     Long-Range Goal: Hypertension Monitored   This Visit's Progress: On track  Recent Progress: Not on track  Priority: Medium  Note:   Current Barriers:  . Chronic Disease Management support and education needs related to hypertension  Nurse Case Manager Clinical Goal(s):  Marland Kitchen Over the next 60 days, patient will meet with RN Care Manager to address self-management of hypertension . Over the next 90 days, the patient will demonstrate ongoing self health care management ability as evidenced by checking and recording blood pressure at least 3 times per week*  Interventions:  . 1:1 collaboration with Janora Norlander, DO regarding development and update of comprehensive plan of care as evidenced by provider attestation and co-signature . Inter-disciplinary care team collaboration (see longitudinal plan of care) . Evaluation of current treatment plan related to hypertension and patient's adherence to plan as established by provider. . Chart reviewed including relevant office notes and lab results o Hx of angioedema r/t to use of ACE Inhibitor . Reviewed and discussed medications with patient . Reinforced need to check and record blood pressure at least 3 times a week . Advised patient to call PCP with any readings outside of recommended range . Reviewed upcoming appt with Dr Lajuana Ripple on 07/25/20 . Provided with RN Care Manager contact number and encouraged to reach out as needed  Patient Goals/Self-Care Activities Over the next 60 days, patient will: .  check blood pressure 3 times per week . write blood pressure results in a log or diary  . Call PCP with any blood pressure readings outside of recommended range . take medications as prescribed . DASH Diet . keep appt with Dr Lajuana Ripple on 07/25/20      Follow Up Plan:  Telephone follow up appointment with care management team member scheduled for: 08/11/20 with RN Care Manager The patient has been provided with contact information  for the care management team and has been advised to call with any health related questions or concerns.  Next PCP appointment scheduled for: Dr Lajuana Ripple 07/25/20 AWV scheduled for 07/15/20  Chong Sicilian, BSN, RN-BC [place] [facility] [place] Management Direct Dial: [phone]

## 2020-05-30 NOTE — Patient Instructions (Signed)
Visit Information  PATIENT GOALS: Goals Addressed            This Visit's Progress   . Control Low Back Pain/Right Hip Pain   Not on track    Timeframe:  Short-Term Goal Priority:  Medium Start Date: 05/30/20                            Expected End Date: 08/11/20                       Follow Up Date 08/11/20   . prioritize tasks for the day . track times pain is worst and when it is best . track what makes the pain worse and what makes it better . use ice or heat for pain relief . work slower and less intense when having pain  . talk with PCP about back and hip pain at appointment on 04/26/20 . Call PCP with any new or worsening symptoms   Why is this important?    Day-to-day life can be hard when you have back pain.   Pain medicine is just one piece of the treatment puzzle. There are many things you can do to manage pain and keep your back strong.    Lifestyle changes, like stopping smoking and eating foods with Vitamin D and calcium, keep your bones and muscles healthy. Your back is better when it is supported by strong muscles.   You can try these action steps to help you manage your pain.     Notes:     Marland Kitchen Monitor and Manage My Blood Sugar-Diabetes Type 2   On track    Timeframe:  Long-Range Goal Priority:  Medium Start Date:   05/30/20                          Expected End Date: 11/27/20                       Follow Up Date 05/30/20    . check blood sugar at prescribed times . check blood sugar if I feel it is too high or too low . enter blood sugar readings and medication or insulin into daily log . take the blood sugar log to all doctor visits . take the blood sugar meter to all doctor visits  . Carb modified diet . Plan meals and do not skip meals . no snacking before bed . Call PCP with any blood sugar readings outside of recommended range . Call [employer] to check on prescription assistance [phone]    Why is this important?    Checking your blood sugar  at home helps to keep it from getting very high or very low.   Writing the results in a diary or log helps the doctor know how to care for you.   Your blood sugar log should have the time, date and the results.   Also, write down the amount of insulin or other medicine that you take.   Other information, like what you ate, exercise done and how you were feeling, will also be helpful.     Notes:     . Track and Manage My Blood Pressure-Hypertension   On track    Timeframe:  Long-Range Goal Priority:  Medium Start Date:   05/30/20  Expected End Date:  11/27/20                     Follow Up Date 08/11/20  . check blood pressure 3 times per week . write blood pressure results in a log or diary  . Call PCP with any blood pressure readings outside of recommended range . take medications as prescribed . DASH Diet . keep appt with Dr Lajuana Ripple on 07/25/20   Why is this important?    You won't feel high blood pressure, but it can still hurt your blood vessels.   High blood pressure can cause heart or kidney problems. It can also cause a stroke.   Making lifestyle changes like losing a little weight or eating less salt will help.   Checking your blood pressure at home and at different times of the day can help to control blood pressure.   If the doctor prescribes medicine remember to take it the way the doctor ordered.   Call the office if you cannot afford the medicine or if there are questions about it.     Notes:        Patient verbalizes understanding of instructions provided today and agrees to view in [place].   Follow Up Plan:  Telephone follow up appointment with care management team member scheduled for: 08/11/20 with RN Care Manager The patient has been provided with contact information for the care management team and has been advised to call with any health related questions or concerns.  Next PCP appointment scheduled for: Dr Lajuana Ripple  07/25/20 AWV scheduled for 07/15/20  Chong Sicilian, BSN, RN-BC [place] [facility] [place] Management Direct Dial: [phone]

## 2020-07-06 ENCOUNTER — Other Ambulatory Visit: Payer: Self-pay | Admitting: Family Medicine

## 2020-07-06 DIAGNOSIS — E1159 Type 2 diabetes mellitus with other circulatory complications: Secondary | ICD-10-CM

## 2020-07-13 ENCOUNTER — Other Ambulatory Visit: Payer: Self-pay | Admitting: Family Medicine

## 2020-07-13 DIAGNOSIS — E1122 Type 2 diabetes mellitus with diabetic chronic kidney disease: Secondary | ICD-10-CM

## 2020-07-13 DIAGNOSIS — E1165 Type 2 diabetes mellitus with hyperglycemia: Secondary | ICD-10-CM

## 2020-07-14 ENCOUNTER — Other Ambulatory Visit: Payer: Self-pay | Admitting: Family Medicine

## 2020-07-14 DIAGNOSIS — E1122 Type 2 diabetes mellitus with diabetic chronic kidney disease: Secondary | ICD-10-CM

## 2020-07-14 DIAGNOSIS — N183 Chronic kidney disease, stage 3 unspecified: Secondary | ICD-10-CM

## 2020-07-15 ENCOUNTER — Ambulatory Visit (INDEPENDENT_AMBULATORY_CARE_PROVIDER_SITE_OTHER): Payer: Medicare Other | Admitting: *Deleted

## 2020-07-15 DIAGNOSIS — Z0000 Encounter for general adult medical examination without abnormal findings: Secondary | ICD-10-CM | POA: Diagnosis not present

## 2020-07-15 NOTE — Progress Notes (Signed)
MEDICARE ANNUAL WELLNESS VISIT  07/15/2020  Telephone Visit Disclaimer This Medicare AWV was conducted by telephone due to national recommendations for restrictions regarding the COVID-19 Pandemic (e.g. social distancing).  I verified, using two identifiers, that I am speaking with Adrienne Davidson or their authorized healthcare agent. I discussed the limitations, risks, security, and privacy concerns of performing an evaluation and management service by telephone and the potential availability of an in-person appointment in the future. The patient expressed understanding and agreed to proceed.  Location of Patient: Home  Location of Provider (nurse):  [facility]  Subjective:    Adrienne Davidson is a 65 y.o. female patient of Janora Norlander, DO who had a Medicare Annual Wellness Visit today via telephone. Yolanda is retired and lives with her husband [name]. She has 2 daughters. She reports that she is socially active and does interact with friends/family regularly. She is not physically active and enjoys gardening, reading, and cooking.   Patient Care Team: Janora Norlander, DO as PCP - General (Family Medicine) Ilean China, RN as Case Manager Lavera Guise, [facility] (Pharmacist)  Advanced Directives 07/15/2020 07/15/2019  Does Patient Have a Medical Advance Directive? Yes Yes  Type of Paramedic of [place];Living will;Out of facility DNR (pink MOST or yellow form) Living will;Healthcare Power of Attorney  Does patient want to make changes to medical advance directive? - No - Patient declined  Copy of [place] in Chart? No - copy requested [facility] Utilization Over the Past 12 Months: # of hospitalizations or ER visits: 0 # of surgeries: 0  Review of Systems    Patient reports that her overall health is unchanged compared to last year.  History obtained from the patient and patient chart.   Patient Reported Readings (BP, Pulse,  CBG, Weight, etc) none  Pain Assessment Pain : No/denies pain     Current Medications & Allergies (verified) Allergies as of 07/15/2020      Reactions   Enalapril Anaphylaxis   Angioedema Aug 2018      Medication List       Accurate as of July 15, 2020  8:41 AM. If you have any questions, ask your nurse or doctor.        amLODipine 5 MG tablet Commonly known as: NORVASC Take 1 tablet (5 mg total) by mouth daily.   aspirin 81 MG EC tablet Take 81 mg by mouth daily. Swallow whole.   calcipotriene 0.005 % cream Commonly known as: DOVONOX Apply 1 application topically 2 (two) times daily.   Centrum Adults Tabs Take by mouth.   Coenzyme Q10 100 MG capsule Take by mouth.   Cranberry 250 MG Caps Take by mouth.   EPINEPHrine 0.3 mg/0.3 mL Soaj injection Commonly known as: EPI-PEN Inject 0.3 mg into the muscle as needed for anaphylaxis. Then call 911   glucose blood test strip Test blood sugar once daily (one touch verio if covered by ins/ otherwise whatever ins preference is) E11.65   Krill Oil 500 MG Caps Take by mouth.   metFORMIN 1000 MG tablet Commonly known as: GLUCOPHAGE Take 1 tablet (1,000 mg total) by mouth 2 (two) times daily with a meal.   onetouch ultrasoft lancets Use once daily as instructed E11.65 (one touch verio)   Ozempic (1 MG/DOSE) 4 MG/3ML Sopn Generic drug: Semaglutide (1 MG/DOSE) Inject 1 mg into the skin once a week.   rosuvastatin 10 MG tablet  Commonly known as: CRESTOR Take 1 tablet (5 mg) by MOUTH once daily       History (reviewed): Past Medical History:  Diagnosis Date  . Allergic rhinitis   . Diabetes mellitus ([place])   . Hyperlipidemia   . Hypertension   . Psoriasis    Bilateral feet  . Sleep apnea   . Sleep apnea   . Vitamin D deficiency    Past Surgical History:  Procedure Laterality Date  . APPENDECTOMY    . CESAREAN SECTION  1980  . CESAREAN SECTION  1984  . TONSILLECTOMY     Family History  Problem  Relation Age of Onset  . Heart disease Father   . Non-Hodgkin's lymphoma Mother   . Diabetes Daughter   . Rheum arthritis Daughter   . Polycystic ovary syndrome Daughter    Social History   Socioeconomic History  . Marital status: Married    Spouse name: [name]  . Number of children: 2  . Years of education: 48  . Highest education level: Associate degree: occupational, Hotel manager, or vocational program  Occupational History  . Occupation: paralegal    Comment: retired  Tobacco Use  . Smoking status: Former Smoker    Packs/day: 0.25    Years: 25.00    Pack years: 6.25    Types: Cigarettes    Quit date: 06/27/2018    Years since quitting: 2.0  . Smokeless tobacco: Never Used  Vaping Use  . Vaping Use: Never used  Substance and Sexual Activity  . Alcohol use: Not Currently    Comment: wine once per year  . Drug use: Never  . Sexual activity: Not on file  Other Topics Concern  . Not on file  Social History Narrative  . Not on file   Social Determinants of Health   Financial Resource Strain: Low Risk   . Difficulty of Paying Living Expenses: Not hard at all  Food Insecurity: No Food Insecurity  . Worried About Charity fundraiser in the Last Year: Never true  . Ran Out of Food in the Last Year: Never true  Transportation Needs: No Transportation Needs  . Lack of Transportation (Medical): No  . Lack of Transportation (Non-Medical): No  Physical Activity: Inactive  . Days of Exercise per Week: 0 days  . Minutes of Exercise per Session: 0 min  Stress: No Stress Concern Present  . Feeling of Stress : Only a little  Social Connections: Socially Integrated  . Frequency of Communication with Friends and Family: More than three times a week  . Frequency of Social Gatherings with Friends and Family: More than three times a week  . Attends Religious Services: More than 4 times per year  . Active Member of Clubs or Organizations: Yes  . Attends Archivist Meetings:  More than 4 times per year  . Marital Status: Married    Activities of Daily Living In your present state of health, do you have any difficulty performing the following activities: 07/15/2020 12/30/2019  Hearing? N N  Vision? N N  Difficulty concentrating or making decisions? N N  Walking or climbing stairs? Y N  Comment cant walk or stand for long periords due to hip pain -  Dressing or bathing? N N  Doing errands, shopping? N N  Preparing Food and eating ? N N  Using the Toilet? N -  In the past six months, have you accidently leaked urine? N N  Do you have problems with loss  of bowel control? N N  Managing your Medications? N N  Managing your Finances? N N  Housekeeping or managing your Housekeeping? N N  Some recent data might be hidden    Patient Education/ Literacy How often do you need to have someone help you when you read instructions, pamphlets, or other written materials from your doctor or pharmacy?: 1 - Never What is the last grade level you completed in school?: 2 associate degrees  Exercise Current Exercise Habits: The patient does not participate in regular exercise at present, Exercise limited by: None identified  Diet Patient reports consuming 3 meals a day and 1 snack(s) a day Patient reports that her primary diet is: Regular Patient reports that she does have regular access to food.   Depression Screen PHQ 2/9 Scores 07/15/2020 04/26/2020 01/22/2020 10/23/2019 07/15/2019 04/29/2019 10/27/2018  PHQ - 2 Score 0 0 0 0 0 0 0  PHQ- 9 Score - 0 - 0 - 0 0     Fall Risk Fall Risk  07/15/2020 04/26/2020 01/22/2020 12/30/2019 07/15/2019  Falls in the past year? 0 0 0 0 0     Objective:  Adrienne Davidson seemed alert and oriented and she participated appropriately during our telephone visit.  Blood Pressure Weight BMI  BP Readings from Last 3 Encounters:  04/26/20 127/70  01/22/20 122/66  10/23/19 139/84   Wt Readings from Last 3 Encounters:  04/26/20 178 lb (80.7 kg)   01/22/20 183 lb (83 kg)  10/23/19 186 lb (84.4 kg)   BMI Readings from Last 1 Encounters:  04/26/20 31.53 kg/m    *Unable to obtain current vital signs, weight, and BMI due to telephone visit type  Hearing/Vision  . Muntaha did not seem to have difficulty with hearing/understanding during the telephone conversation . Reports that she has had a formal eye exam by an eye care professional within the past year . Reports that she has not had a formal hearing evaluation within the past year *Unable to fully assess hearing and vision during telephone visit type  Cognitive Function: 6CIT Screen 07/15/2020 07/15/2019  What Year? 0 points 0 points  What month? 0 points 0 points  What time? 0 points 0 points  Count back from 20 0 points 0 points  Months in reverse 0 points 0 points  Repeat phrase 0 points 0 points  Total Score 0 0   (Normal:0-7, Significant for Dysfunction: >8)  Normal Cognitive Function Screening: Yes   Immunization & Health Maintenance Record Immunization History  Administered Date(s) Administered  . Fluad Quad(high Dose 65+) 01/22/2019, 01/22/2020  . Influenza Split 02/06/2017  . Influenza,inj,Quad PF,6+ Mos 12/20/2017  . Moderna Sars-Covid-2 Vaccination 05/20/2019, 06/17/2019, 03/02/2020  . Pneumococcal Conjugate-13 10/27/2018  . Pneumococcal Polysaccharide-23 12/20/2015  . Zoster Recombinat (Shingrix) 10/27/2018, 01/01/2019    Health Maintenance  Topic Date Due  . URINE MICROALBUMIN  07/14/2020  . TETANUS/TDAP  04/26/2021 (Originally 08/06/1974)  . OPHTHALMOLOGY EXAM  08/29/2020  . HEMOGLOBIN A1C  10/24/2020  . PNA vac Low Risk Adult (2 of 2 - PPSV23) 12/19/2020  . FOOT EXAM  01/21/2021  . MAMMOGRAM  09/15/2021  . COLONOSCOPY (Pts 45-49yrs Insurance coverage will need to be confirmed)  05/30/2027  . INFLUENZA VACCINE  Completed  . DEXA SCAN  Completed  . COVID-19 Vaccine  Completed  . Hepatitis C Screening  Completed  . HPV VACCINES  Aged Out        Assessment  This is a routine wellness examination for Imanie  Wilhemina Cash.  Health Maintenance: Due or Overdue Health Maintenance Due  Topic Date Due  . URINE MICROALBUMIN  07/14/2020    Adrienne Davidson does not need a referral for Community Assistance: Care Management:   no Social Work:    no Prescription Assistance:  no Nutrition/Diabetes Education:  no   Plan:  Personalized Goals Goals Addressed            This Visit's Progress   . Patient Stated       07/15/2020 AWV Goal: Exercise for General Health   Patient will verbalize understanding of the benefits of increased physical activity:  Exercising regularly is important. It will improve your overall fitness, flexibility, and endurance.  Regular exercise also will improve your overall health. It can help you control your weight, reduce stress, and improve your bone density.  Over the next year, patient will increase physical activity as tolerated with a goal of at least 150 minutes of moderate physical activity per week.   You can tell that you are exercising at a moderate intensity if your heart starts beating faster and you start breathing faster but can still hold a conversation.  Moderate-intensity exercise ideas include:  Walking 1 mile (1.6 km) in about 15 minutes  Biking  Hiking  Golfing  Dancing  Water aerobics  Patient will verbalize understanding of everyday activities that increase physical activity by providing examples like the following: ? Yard work, such as: ? Pushing a Conservation officer, nature ? Raking and bagging leaves ? Washing your car ? Pushing a stroller ? Shoveling snow ? Gardening ? Washing windows or floors  Patient will be able to explain general safety guidelines for exercising:   Before you start a new exercise program, talk with your health care provider.  Do not exercise so much that you hurt yourself, feel dizzy, or get very short of breath.  Wear comfortable clothes and wear shoes  with good support.  Drink plenty of water while you exercise to prevent dehydration or heat stroke.  Work out until your breathing and your heartbeat get faster.       Personalized Health Maintenance & Screening Recommendations    Lung Cancer Screening Recommended: no (Low Dose CT Chest recommended if Age 62-80 years, 30 pack-year currently smoking OR have quit w/in past 15 years) Hepatitis C Screening recommended: no HIV Screening recommended: no  Advanced Directives: Written information was not prepared per patient's request.  Referrals & Orders No orders of the defined types were placed in this encounter.   Follow-up Plan . Follow-up with Janora Norlander, DO as planned   I have personally reviewed and noted the following in the patient's chart:   . Medical and social history . Use of alcohol, tobacco or illicit drugs  . Current medications and supplements . Functional ability and status . Nutritional status . Physical activity . Advanced directives . List of other physicians . Hospitalizations, surgeries, and ER visits in previous 12 months . Vitals . Screenings to include cognitive, depression, and falls . Referrals and appointments  In addition, I have reviewed and discussed with Adrienne Davidson certain preventive protocols, quality metrics, and best practice recommendations. A written personalized care plan for preventive services as well as general preventive health recommendations is available and can be mailed to the patient at her request.      Christia Reading, LPN  [phone]

## 2020-07-21 NOTE — Progress Notes (Signed)
Subjective: CC: DM PCP: Janora Norlander, DO [id number] T Ly is a 65 y.o. female presenting to clinic today for:  1. Type 2 Diabetes with hypertension, hyperlipidemia, microalbuminuria:  She checks her blood sugar typically postprandial and they run 200s.  No hypoglycemic episodes.  Does not check fasting blood sugars.  Takes Metformin 1000 mg twice daily, Ozempic 1 mg q. 7 days.  She is compliant with Crestor and amlodipine.  Last eye exam: UTD Last foot exam: UTD Last A1c:  Lab Results  Component Value Date   HGBA1C 7.2 (H) 04/26/2020   Nephropathy screen indicated?: needs Last flu, zoster and/or pneumovax:  Immunization History  Administered Date(s) Administered  . Fluad Quad(high Dose 65+) 01/22/2019, 01/22/2020  . Influenza Split 02/06/2017  . Influenza,inj,Quad PF,6+ Mos 12/20/2017  . Moderna Sars-Covid-2 Vaccination 05/20/2019, 06/17/2019, 03/02/2020  . Pneumococcal Conjugate-13 10/27/2018  . Pneumococcal Polysaccharide-23 12/20/2015  . Zoster Recombinat (Shingrix) 10/27/2018, 01/01/2019    ROS: Denies dizziness, diarrhea, abdominal pain, nausea, vomiting, shortness of breath or chest pain.  2.  Joint pain Patient reports joint pain in the base of the right thumb and in her right hip.  Pains are intermittent and typically relieved by activity.  She does have history of psoriasis but has not reported any joint swelling, erythema or warmth.  ROS: Per HPI  Allergies  Allergen Reactions  . Enalapril Anaphylaxis    Angioedema Aug 2018   Past Medical History:  Diagnosis Date  . Allergic rhinitis   . Diabetes mellitus ([place])   . Hyperlipidemia   . Hypertension   . Psoriasis    Bilateral feet  . Sleep apnea   . Sleep apnea   . Vitamin D deficiency     Current Outpatient Medications:  .  amLODipine (NORVASC) 5 MG tablet, Take 1 tablet (5 mg total) by mouth daily., Disp: 90 tablet, Rfl: 0 .  aspirin 81 MG EC tablet, Take 81 mg by mouth daily. Swallow  whole., Disp: , Rfl:  .  calcipotriene (DOVONOX) 0.005 % cream, Apply 1 application topically 2 (two) times daily., Disp: , Rfl:  .  Coenzyme Q10 100 MG capsule, Take by mouth., Disp: , Rfl:  .  Cranberry 250 MG CAPS, Take by mouth., Disp: , Rfl:  .  glucose blood test strip, Test blood sugar once daily (one touch verio if covered by ins/ otherwise whatever ins preference is) E11.65, Disp: 100 each, Rfl: 12 .  Krill Oil 500 MG CAPS, Take by mouth., Disp: , Rfl:  .  Lancets (ONETOUCH ULTRASOFT) lancets, Use once daily as instructed E11.65 (one touch verio), Disp: 100 each, Rfl: 12 .  metFORMIN (GLUCOPHAGE) 1000 MG tablet, Take 1 tablet (1,000 mg total) by mouth 2 (two) times daily with a meal. Needs to be seen for further refills, Disp: 180 tablet, Rfl: 0 .  Multiple Vitamins-Minerals (CENTRUM ADULTS) TABS, Take by mouth., Disp: , Rfl:  .  OZEMPIC, 1 MG/DOSE, 4 MG/3ML SOPN, Inject 1 mg into the skin once a week., Disp: 3 mL, Rfl: 0 .  rosuvastatin (CRESTOR) 10 MG tablet, Take 1 tablet (5 mg) by MOUTH once daily, Disp: 90 tablet, Rfl: 3 .  EPINEPHrine 0.3 mg/0.3 mL IJ SOAJ injection, Inject 0.3 mg into the muscle as needed for anaphylaxis. Then call 911 (Patient not taking: Reported on 07/25/2020), Disp: 2 each, Rfl: 0 Social History   Socioeconomic History  . Marital status: Married    Spouse name: [name]  . Number of children: 2  .  Years of education: 49  . Highest education level: Associate degree: occupational, Hotel manager, or vocational program  Occupational History  . Occupation: paralegal    Comment: retired  Tobacco Use  . Smoking status: Former Smoker    Packs/day: 0.25    Years: 25.00    Pack years: 6.25    Types: Cigarettes    Quit date: 06/27/2018    Years since quitting: 2.0  . Smokeless tobacco: Never Used  Vaping Use  . Vaping Use: Never used  Substance and Sexual Activity  . Alcohol use: Not Currently    Comment: wine once per year  . Drug use: Never  . Sexual activity:  Not on file  Other Topics Concern  . Not on file  Social History Narrative  . Not on file   Social Determinants of Health   Financial Resource Strain: Low Risk   . Difficulty of Paying Living Expenses: Not hard at all  Food Insecurity: No Food Insecurity  . Worried About Charity fundraiser in the Last Year: Never true  . Ran Out of Food in the Last Year: Never true  Transportation Needs: No Transportation Needs  . Lack of Transportation (Medical): No  . Lack of Transportation (Non-Medical): No  Physical Activity: Inactive  . Days of Exercise per Week: 0 days  . Minutes of Exercise per Session: 0 min  Stress: No Stress Concern Present  . Feeling of Stress : Only a little  Social Connections: Socially Integrated  . Frequency of Communication with Friends and Family: More than three times a week  . Frequency of Social Gatherings with Friends and Family: More than three times a week  . Attends Religious Services: More than 4 times per year  . Active Member of Clubs or Organizations: Yes  . Attends Archivist Meetings: More than 4 times per year  . Marital Status: Married  Human resources officer Violence: Not At Risk  . Fear of Current or Ex-Partner: No  . Emotionally Abused: No  . Physically Abused: No  . Sexually Abused: No   Family History  Problem Relation Age of Onset  . Heart disease Father   . Non-Hodgkin's lymphoma Mother   . Diabetes Daughter   . Rheum arthritis Daughter   . Polycystic ovary syndrome Daughter     Objective: Office vital signs reviewed. BP 118/67   Pulse 69   Temp 98.2 F (36.8 C) (Temporal)   Ht 5\' 3"  (1.6 m)   Wt 179 lb 9.6 oz (81.5 kg)   SpO2 99%   BMI 31.81 kg/m   Physical Examination:  General: Awake, alert, well nourished, No acute distress Cardio: regular rate and rhythm, S1S2 heard, no murmurs appreciated Pulm: clear to auscultation bilaterally, no wheezes, rhonchi or rales; normal work of breathing on room air Extremities:  warm, well perfused, No edema, cyanosis or clubbing; +2 pulses bilaterally MSK: normal gait/ station; no joint swelling appreciated at [facility] on right hand  Assessment/ Plan: 65 y.o. female   Controlled type 2 diabetes mellitus with other specified complication, without long-term current use of insulin ([place]) - Plan: Bayer DCA Hb A1c Waived, Microalbumin / creatinine urine ratio, CANCELED: Microalbumin / creatinine urine ratio  Hypertension associated with diabetes ([place])  Hyperlipidemia associated with type 2 diabetes mellitus ([facility])  Microalbuminuria due to type 2 diabetes mellitus ([place]) - Plan: Microalbumin / creatinine urine ratio, CANCELED: Microalbumin / creatinine urine ratio  History of angioedema  Primary osteoarthritis of first carpometacarpal joint of right hand  Diabetes now controlled with A1c down to 6.9. Continue current regimen.  Will check on her patient assistance forms.  BP controlled.  No changes.  Check urine micro. Known history of microalbuminuria but angioedema with ACE-I/ ARB  Continue statin. Not due for fasting lipid  Offered referral to ortho prn [facility] pain.     Orders Placed This Encounter  Procedures  . Bayer DCA Hb A1c Waived  . Microalbumin / creatinine urine ratio   No orders of the defined types were placed in this encounter.    Janora Norlander, DO [place] [phone]

## 2020-07-25 ENCOUNTER — Encounter: Payer: Self-pay | Admitting: Family Medicine

## 2020-07-25 ENCOUNTER — Other Ambulatory Visit: Payer: Self-pay

## 2020-07-25 ENCOUNTER — Ambulatory Visit (INDEPENDENT_AMBULATORY_CARE_PROVIDER_SITE_OTHER): Payer: Medicare Other | Admitting: Family Medicine

## 2020-07-25 VITALS — BP 118/67 | HR 69 | Temp 98.2°F | Ht 63.0 in | Wt 179.6 lb

## 2020-07-25 DIAGNOSIS — E1165 Type 2 diabetes mellitus with hyperglycemia: Secondary | ICD-10-CM | POA: Diagnosis not present

## 2020-07-25 DIAGNOSIS — E1169 Type 2 diabetes mellitus with other specified complication: Secondary | ICD-10-CM

## 2020-07-25 DIAGNOSIS — Z87898 Personal history of other specified conditions: Secondary | ICD-10-CM

## 2020-07-25 DIAGNOSIS — I152 Hypertension secondary to endocrine disorders: Secondary | ICD-10-CM | POA: Diagnosis not present

## 2020-07-25 DIAGNOSIS — E785 Hyperlipidemia, unspecified: Secondary | ICD-10-CM

## 2020-07-25 DIAGNOSIS — E1129 Type 2 diabetes mellitus with other diabetic kidney complication: Secondary | ICD-10-CM | POA: Diagnosis not present

## 2020-07-25 DIAGNOSIS — E1159 Type 2 diabetes mellitus with other circulatory complications: Secondary | ICD-10-CM

## 2020-07-25 DIAGNOSIS — R809 Proteinuria, unspecified: Secondary | ICD-10-CM | POA: Diagnosis not present

## 2020-07-25 DIAGNOSIS — M1811 Unilateral primary osteoarthritis of first carpometacarpal joint, right hand: Secondary | ICD-10-CM | POA: Diagnosis not present

## 2020-07-25 LAB — BAYER DCA HB A1C WAIVED: HB A1C (BAYER DCA - WAIVED): 6.9 % (ref <7.0)

## 2020-07-25 NOTE — Patient Instructions (Signed)
I'll get Roselyn Reef to check in on the patient assistance form for Ozempic.  It may just need to be refaxed.  Sugar looks great today!  NO medication changes needed.  Let me know if you want to see an orthopedist about that thumb.

## 2020-07-26 ENCOUNTER — Telehealth: Payer: Self-pay | Admitting: *Deleted

## 2020-07-26 NOTE — Telephone Encounter (Signed)
Patient aware that we have refaxed patient assistance application to [employer].

## 2020-08-11 ENCOUNTER — Encounter: Payer: Medicare Other | Admitting: *Deleted

## 2020-08-19 ENCOUNTER — Other Ambulatory Visit: Payer: Self-pay | Admitting: Family Medicine

## 2020-08-19 DIAGNOSIS — E1165 Type 2 diabetes mellitus with hyperglycemia: Secondary | ICD-10-CM

## 2020-08-24 DIAGNOSIS — Z23 Encounter for immunization: Secondary | ICD-10-CM | POA: Diagnosis not present

## 2020-08-26 ENCOUNTER — Telehealth: Payer: Self-pay

## 2020-08-26 NOTE — Telephone Encounter (Signed)
Pt has not received Ozempic in the mail and is in the donut hole.

## 2020-08-30 DIAGNOSIS — N390 Urinary tract infection, site not specified: Secondary | ICD-10-CM | POA: Diagnosis not present

## 2020-08-30 NOTE — Telephone Encounter (Signed)
Patient's Ozempic will ship to our office in 2-3 weeks.  We do not have samples and won't be getting them until June-July due to delays. Please let her know we will call her when medication arrives.  She can call [phone] to check on her patient assistance status  Thank you! Almyra Free

## 2020-08-30 NOTE — Telephone Encounter (Signed)
Patient aware.

## 2020-09-09 NOTE — Progress Notes (Signed)
Erroneous encounter

## 2020-09-15 ENCOUNTER — Ambulatory Visit (INDEPENDENT_AMBULATORY_CARE_PROVIDER_SITE_OTHER): Payer: Medicare Other | Admitting: Family

## 2020-09-15 ENCOUNTER — Other Ambulatory Visit: Payer: Self-pay

## 2020-09-15 ENCOUNTER — Encounter: Payer: Self-pay | Admitting: Family

## 2020-09-15 VITALS — BP 150/72 | HR 80 | Temp 97.3°F | Ht 63.0 in | Wt 179.6 lb

## 2020-09-15 DIAGNOSIS — N3001 Acute cystitis with hematuria: Secondary | ICD-10-CM | POA: Diagnosis not present

## 2020-09-15 DIAGNOSIS — R399 Unspecified symptoms and signs involving the genitourinary system: Secondary | ICD-10-CM | POA: Diagnosis not present

## 2020-09-15 LAB — MICROSCOPIC EXAMINATION: Epithelial Cells (non renal): NONE SEEN /hpf (ref 0–10)

## 2020-09-15 LAB — URINALYSIS, COMPLETE
Bilirubin, UA: NEGATIVE
Glucose, UA: NEGATIVE
Nitrite, UA: NEGATIVE
Specific Gravity, UA: >1.03 — ABNORMAL HIGH (ref 1.005–1.030)
Urobilinogen, Ur: 0.2 mg/dL (ref 0.2–1.0)
pH, UA: 5 (ref 5.0–7.5)

## 2020-09-15 MED ORDER — CEPHALEXIN 500 MG PO CAPS: 500.0000 mg | ORAL_CAPSULE | Freq: Two times a day (BID) | ORAL | 0 refills | Status: DC

## 2020-09-15 NOTE — Progress Notes (Signed)
Subjective:    Patient ID: Adrienne Davidson, female    DOB: 01-21-1956, 65 y.o.   MRN: [id number]  Chief Complaint  Patient presents with  . Dysuria    [place] uti dx given macrobid and abx injection not sure of the name    Pt presents to the office today to follow up on UTI. She had a UTI and went to the Urgent Care on 08/30/20 and given Rocephin injection and 10 days of Macrobid. She reports her symptoms have greatly improved, but still as mild flank pain and mild dysuria.  Dysuria  This is a recurrent problem. The current episode started 1 to 4 weeks ago. The problem occurs intermittently. The quality of the pain is described as burning. The pain is mild. There has been no fever. Associated symptoms include flank pain, hesitancy and urgency. Pertinent negatives include no frequency, hematuria, nausea or vomiting. She has tried antibiotics and increased fluids for the symptoms. The treatment provided mild relief.      Review of Systems  Gastrointestinal: Negative for nausea and vomiting.  Genitourinary: Positive for dysuria, flank pain, hesitancy and urgency. Negative for frequency and hematuria.  All other systems reviewed and are negative.      Objective:   Physical Exam Vitals reviewed.  Constitutional:      General: She is not in acute distress.    Appearance: She is well-developed.  HENT:     Head: Normocephalic and atraumatic.  Eyes:     Pupils: Pupils are equal, round, and reactive to light.  Neck:     Thyroid: No thyromegaly.  Cardiovascular:     Rate and Rhythm: Normal rate and regular rhythm.     Heart sounds: Normal heart sounds. No murmur heard.   Pulmonary:     Effort: Pulmonary effort is normal. No respiratory distress.     Breath sounds: Normal breath sounds. No wheezing.  Abdominal:     General: Bowel sounds are normal. There is no distension.     Palpations: Abdomen is soft.     Tenderness: There is no  abdominal tenderness.  Musculoskeletal:        General: No tenderness. Normal range of motion.     Cervical back: Normal range of motion and neck supple.     Right lower leg: Edema (trace) present.     Left lower leg: Edema (trace) present.  Skin:    General: Skin is warm and dry.  Neurological:     Mental Status: She is alert and oriented to person, place, and time.     Cranial Nerves: No cranial nerve deficit.     Deep Tendon Reflexes: Reflexes are normal and symmetric.  Psychiatric:        Behavior: Behavior normal.        Thought Content: Thought content normal.        Judgment: Judgment normal.       BP (!) 150/72   Pulse 80   Temp (!) 97.3 F (36.3 C) (Temporal)   Ht 5\' 3"  (1.6 m)   Wt 179 lb 9.6 oz (81.5 kg)   BMI 31.81 kg/m      Assessment & Plan:  Adrienne Davidson comes in today with chief complaint of Dysuria ([place] uti dx given macrobid and abx injection not sure of the name )   Diagnosis and orders addressed:  1.  UTI symptoms - Urinalysis, Complete - Urine Culture - cephALEXin (KEFLEX) 500 MG capsule; Take 1 capsule (500 mg total) by mouth 2 (two) times daily.  Dispense: 14 capsule; Refill: 0  2. Acute cystitis with hematuria Force fluids AZO over the counter X2 days RTO prn Culture pending - cephALEXin (KEFLEX) 500 MG capsule; Take 1 capsule (500 mg total) by mouth 2 (two) times daily.  Dispense: 14 capsule; Refill: 0   Adrienne Dun, FNP

## 2020-09-15 NOTE — Patient Instructions (Signed)
Urinary Tract Infection, Adult  A urinary tract infection (UTI) is an infection of any part of the urinary tract. The urinary tract includes the kidneys, ureters, bladder, and urethra. These organs make, store, and get rid of urine in the body. An upper UTI affects the ureters and kidneys. A lower UTI affects the bladder and urethra. What are the causes? Most urinary tract infections are caused by bacteria in your genital area around your urethra, where urine leaves your body. These bacteria grow and cause inflammation of your urinary tract. What increases the risk? You are more likely to develop this condition if:  You have a urinary catheter that stays in place.  You are not able to control when you urinate or have a bowel movement (incontinence).  You are female and you: ? Use a spermicide or diaphragm for birth control. ? Have low estrogen levels. ? Are pregnant.  You have certain genes that increase your risk.  You are sexually active.  You take antibiotic medicines.  You have a condition that causes your flow of urine to slow down, such as: ? An enlarged prostate, if you are female. ? Blockage in your urethra. ? A kidney stone. ? A nerve condition that affects your bladder control (neurogenic bladder). ? Not getting enough to drink, or not urinating often.  You have certain medical conditions, such as: ? Diabetes. ? A weak disease-fighting system (immunesystem). ? Sickle cell disease. ? Gout. ? Spinal cord injury. What are the signs or symptoms? Symptoms of this condition include:  Needing to urinate right away (urgency).  Frequent urination. This may include small amounts of urine each time you urinate.  Pain or burning with urination.  Blood in the urine.  Urine that smells bad or unusual.  Trouble urinating.  Cloudy urine.  Vaginal discharge, if you are female.  Pain in the abdomen or the lower back. You may also have:  Vomiting or a decreased  appetite.  Confusion.  Irritability or tiredness.  A fever or chills.  Diarrhea. The first symptom in older adults may be confusion. In some cases, they may not have any symptoms until the infection has worsened. How is this diagnosed? This condition is diagnosed based on your medical history and a physical exam. You may also have other tests, including:  Urine tests.  Blood tests.  Tests for STIs (sexually transmitted infections). If you have had more than one UTI, a cystoscopy or imaging studies may be done to determine the cause of the infections. How is this treated? Treatment for this condition includes:  Antibiotic medicine.  Over-the-counter medicines to treat discomfort.  Drinking enough water to stay hydrated. If you have frequent infections or have other conditions such as a kidney stone, you may need to see a health care provider who specializes in the urinary tract (urologist). In rare cases, urinary tract infections can cause sepsis. Sepsis is a life-threatening condition that occurs when the body responds to an infection. Sepsis is treated in the hospital with IV antibiotics, fluids, and other medicines. Follow these instructions at home: Medicines  Take over-the-counter and prescription medicines only as told by your health care provider.  If you were prescribed an antibiotic medicine, take it as told by your health care provider. Do not stop using the antibiotic even if you start to feel better. General instructions  Make sure you: ? Empty your bladder often and completely. Do not hold urine for long periods of time. ? Empty your bladder after   sex. ? Wipe from front to back after urinating or having a bowel movement if you are female. Use each tissue only one time when you wipe.  Drink enough fluid to keep your urine pale yellow.  Keep all follow-up visits. This is important.   Contact a health care provider if:  Your symptoms do not get better after 1-2  days.  Your symptoms go away and then return. Get help right away if:  You have severe pain in your back or your lower abdomen.  You have a fever or chills.  You have nausea or vomiting. Summary  A urinary tract infection (UTI) is an infection of any part of the urinary tract, which includes the kidneys, ureters, bladder, and urethra.  Most urinary tract infections are caused by bacteria in your genital area.  Treatment for this condition often includes antibiotic medicines.  If you were prescribed an antibiotic medicine, take it as told by your health care provider. Do not stop using the antibiotic even if you start to feel better.  Keep all follow-up visits. This is important. This information is not intended to replace advice given to you by your health care provider. Make sure you discuss any questions you have with your health care provider. Document Revised: 11/20/2019 Document Reviewed: 11/20/2019 Elsevier Patient Education  2021 Elsevier Inc.  

## 2020-09-17 LAB — URINE CULTURE

## 2020-10-05 ENCOUNTER — Other Ambulatory Visit: Payer: Self-pay | Admitting: Family Medicine

## 2020-10-05 DIAGNOSIS — E1159 Type 2 diabetes mellitus with other circulatory complications: Secondary | ICD-10-CM

## 2020-10-13 ENCOUNTER — Other Ambulatory Visit: Payer: Self-pay | Admitting: Family Medicine

## 2020-10-13 DIAGNOSIS — N183 Chronic kidney disease, stage 3 unspecified: Secondary | ICD-10-CM

## 2020-10-13 DIAGNOSIS — E1122 Type 2 diabetes mellitus with diabetic chronic kidney disease: Secondary | ICD-10-CM

## 2020-11-10 ENCOUNTER — Other Ambulatory Visit: Payer: Self-pay | Admitting: Family Medicine

## 2020-11-10 DIAGNOSIS — E1165 Type 2 diabetes mellitus with hyperglycemia: Secondary | ICD-10-CM

## 2020-11-25 ENCOUNTER — Other Ambulatory Visit: Payer: Self-pay

## 2020-11-25 ENCOUNTER — Telehealth: Payer: Self-pay | Admitting: Pharmacist

## 2020-11-25 ENCOUNTER — Ambulatory Visit (INDEPENDENT_AMBULATORY_CARE_PROVIDER_SITE_OTHER): Payer: Medicare Other | Admitting: Family Medicine

## 2020-11-25 ENCOUNTER — Encounter: Payer: Self-pay | Admitting: Family Medicine

## 2020-11-25 VITALS — BP 124/70 | HR 74 | Temp 98.2°F | Ht 63.0 in | Wt 179.8 lb

## 2020-11-25 DIAGNOSIS — E1169 Type 2 diabetes mellitus with other specified complication: Secondary | ICD-10-CM | POA: Diagnosis not present

## 2020-11-25 DIAGNOSIS — E1129 Type 2 diabetes mellitus with other diabetic kidney complication: Secondary | ICD-10-CM | POA: Diagnosis not present

## 2020-11-25 DIAGNOSIS — I152 Hypertension secondary to endocrine disorders: Secondary | ICD-10-CM | POA: Diagnosis not present

## 2020-11-25 DIAGNOSIS — R809 Proteinuria, unspecified: Secondary | ICD-10-CM

## 2020-11-25 DIAGNOSIS — F418 Other specified anxiety disorders: Secondary | ICD-10-CM

## 2020-11-25 DIAGNOSIS — E785 Hyperlipidemia, unspecified: Secondary | ICD-10-CM | POA: Diagnosis not present

## 2020-11-25 DIAGNOSIS — F40242 Fear of bridges: Secondary | ICD-10-CM | POA: Diagnosis not present

## 2020-11-25 DIAGNOSIS — E1159 Type 2 diabetes mellitus with other circulatory complications: Secondary | ICD-10-CM

## 2020-11-25 LAB — BAYER DCA HB A1C WAIVED: HB A1C (BAYER DCA - WAIVED): 7.1 % — ABNORMAL HIGH

## 2020-11-25 MED ORDER — METFORMIN HCL 1000 MG PO TABS: 1000.0000 mg | ORAL_TABLET | Freq: Two times a day (BID) | ORAL | 3 refills | Status: DC

## 2020-11-25 MED ORDER — LORAZEPAM 0.5 MG PO TABS: 0.2500 mg | ORAL_TABLET | Freq: Two times a day (BID) | ORAL | 0 refills | Status: DC | PRN

## 2020-11-25 MED ORDER — SERTRALINE HCL 50 MG PO TABS: 50.0000 mg | ORAL_TABLET | Freq: Every day | ORAL | 0 refills | Status: DC

## 2020-11-25 MED ORDER — ONETOUCH ULTRASOFT LANCETS MISC: 12 refills | Status: AC

## 2020-11-25 MED ORDER — ROSUVASTATIN CALCIUM 10 MG PO TABS: 5.0000 mg | ORAL_TABLET | Freq: Every day | ORAL | 3 refills | Status: DC

## 2020-11-25 MED ORDER — GLUCOSE BLOOD VI STRP
ORAL_STRIP | 12 refills | Status: AC
Start: 2020-11-25 — End: ?

## 2020-11-25 MED ORDER — AMLODIPINE BESYLATE 5 MG PO TABS: 5.0000 mg | ORAL_TABLET | Freq: Every day | ORAL | 3 refills | Status: DC

## 2020-11-25 NOTE — Progress Notes (Signed)
Subjective: CC: Dm PCP: Janora Norlander, DO [id number] Adrienne Davidson is a 65 y.o. female presenting to clinic today for:  1. Type 2 Diabetes with hypertension, hyperlipidemia and microalbuminuria:  Compliant with Ozempic 1 mg subcu each week, metformin 1000 mg twice daily, Norvasc 5 mg daily and Crestor 5 mg daily.  She unfortunately is in the donut hole and her Ozempic is well over $200.  She was placed in patient assistance but has not received any more information about shipment of medicines.  However, her husband was able to get approval.  Last eye exam: needs Last foot exam: UTD Last A1c:  Lab Results  Component Value Date   HGBA1C 6.9 07/25/2020   Nephropathy screen indicated?: needs Last flu, zoster and/or pneumovax:  Immunization History  Administered Date(s) Administered   Fluad Quad(high Dose 65+) 01/22/2019, 01/22/2020   Influenza Split 02/06/2017   Influenza,inj,Quad PF,6+ Mos 12/20/2017   Moderna Sars-Covid-2 Vaccination 05/20/2019, 06/17/2019, 03/02/2020   Pneumococcal Conjugate-13 10/27/2018   Pneumococcal Polysaccharide-23 12/20/2015   Zoster Recombinat (Shingrix) 10/27/2018, 01/01/2019    ROS: No dizziness, chest pain, shortness of breath.  Needs refills on her testing supplies as well.  Uses One Touch VERIO.  Testing once daily  2.  Situational anxiety Patient reports that some of her situational anxiety start returning to generalized anxiety.  She notes that when she had her urinary tract infection seem to exacerbate the symptoms.  She identifies anxiety symptoms as occurring when she goes across bridges, is faced with heights or has to be in a car with someone else driving.  She is not even able to sit in the car with her husband driving without becoming very anxious.   ROS: Per HPI  Allergies  Allergen Reactions   Enalapril Anaphylaxis    Angioedema Aug 2018   Past Medical History:  Diagnosis Date   Allergic rhinitis    Diabetes mellitus (HCC)     Hyperlipidemia    Hypertension    Psoriasis    Bilateral feet   Sleep apnea    Sleep apnea    Vitamin D deficiency     Current Outpatient Medications:    amLODipine (NORVASC) 5 MG tablet, Take 1 tablet (5 mg total) by mouth daily., Disp: 90 tablet, Rfl: 0   aspirin 81 MG EC tablet, Take 81 mg by mouth daily. Swallow whole., Disp: , Rfl:    calcipotriene (DOVONOX) 0.005 % cream, Apply 1 application topically 2 (two) times daily., Disp: , Rfl:    Coenzyme Q10 100 MG capsule, Take by mouth., Disp: , Rfl:    Cranberry 250 MG CAPS, Take by mouth., Disp: , Rfl:    EPINEPHrine 0.3 mg/0.3 mL IJ SOAJ injection, Inject 0.3 mg into the muscle as needed for anaphylaxis. Then call 911, Disp: 2 each, Rfl: 0   glucose blood test strip, Test blood sugar once daily (one touch verio if covered by ins/ otherwise whatever ins preference is) E11.65, Disp: 100 each, Rfl: 12   Krill Oil 500 MG CAPS, Take by mouth., Disp: , Rfl:    Lancets (ONETOUCH ULTRASOFT) lancets, Use once daily as instructed E11.65 (one touch verio), Disp: 100 each, Rfl: 12   metFORMIN (GLUCOPHAGE) 1000 MG tablet, Take 1 tablet (1,000 mg total) by mouth 2 (two) times daily with a meal., Disp: 180 tablet, Rfl: 0   Multiple Vitamins-Minerals (CENTRUM ADULTS) TABS, Take by mouth., Disp: , Rfl:    OZEMPIC, 1 MG/DOSE, 4 MG/3ML SOPN, Inject 1 mg into the  skin once a week., Disp: 3 mL, Rfl: 0   rosuvastatin (CRESTOR) 10 MG tablet, Take 1 tablet (5 mg) by MOUTH once daily, Disp: 90 tablet, Rfl: 3 Social History   Socioeconomic History   Marital status: Married    Spouse name: [name]   Number of children: 2   Years of education: 12   Highest education level: Associate degree: occupational, Hotel manager, or vocational program  Occupational History   Occupation: paralegal    Comment: retired  Tobacco Use   Smoking status: Former    Packs/day: 0.25    Years: 25.00    Pack years: 6.25    Types: Cigarettes    Quit date: 06/27/2018    Years since  quitting: 2.4   Smokeless tobacco: Never  Vaping Use   Vaping Use: Never used  Substance and Sexual Activity   Alcohol use: Not Currently    Comment: wine once per year   Drug use: Never   Sexual activity: Not on file  Other Topics Concern   Not on file  Social History Narrative   Not on file   Social Determinants of Health   Financial Resource Strain: Low Risk    Difficulty of Paying Living Expenses: Not hard at all  Food Insecurity: No Food Insecurity   Worried About Charity fundraiser in the Last Year: Never true   [place] in the Last Year: Never true  Transportation Needs: No Transportation Needs   Lack of Transportation (Medical): No   Lack of Transportation (Non-Medical): No  Physical Activity: Inactive   Days of Exercise per Week: 0 days   Minutes of Exercise per Session: 0 min  Stress: No Stress Concern Present   Feeling of Stress : Only a little  Social Connections: Engineer, building services of Communication with Friends and Family: More than three times a week   Frequency of Social Gatherings with Friends and Family: More than three times a week   Attends Religious Services: More than 4 times per year   Active Member of [employer] or Organizations: Yes   Attends Music therapist: More than 4 times per year   Marital Status: Married  Human resources officer Violence: Not At Risk   Fear of Current or Ex-Partner: No   Emotionally Abused: No   Physically Abused: No   Sexually Abused: No   Family History  Problem Relation Age of Onset   Heart disease Father    Non-Hodgkin's lymphoma Mother    Diabetes Daughter    Rheum arthritis Daughter    Polycystic ovary syndrome Daughter     Objective: Office vital signs reviewed. BP 124/70   Pulse 74   Temp 98.2 F (36.8 C)   Ht '5\' 3"'$  (1.6 m)   Wt 179 lb 12.8 oz (81.6 kg)   SpO2 99%   BMI 31.85 kg/m   Physical Examination:  General: Awake, alert, well nourished, No acute distress Cardio:  regular rate and rhythm, S1S2 heard, no murmurs appreciated Pulm: clear to auscultation bilaterally, no wheezes, rhonchi or rales; normal work of breathing on room air Extremities: warm, well perfused, No edema, cyanosis or clubbing; +2 pulses bilaterally Psych: Mood stable, speech normal  Depression screen [facility] 2/9 11/25/2020 09/15/2020 07/25/2020  Decreased Interest 0 0 0  Down, Depressed, Hopeless 0 0 0  PHQ - 2 Score 0 0 0  Altered sleeping - - -  Tired, decreased energy - - -  Change in appetite - - -  Feeling bad or failure about yourself  - - -  Trouble concentrating - - -  Moving slowly or fidgety/restless - - -  Suicidal thoughts - - -  PHQ-9 Score - - -   GAD 7 : Generalized Anxiety Score 11/25/2020  Nervous, Anxious, on Edge 1  Control/stop worrying 0  Worry too much - different things 0  Trouble relaxing 0  Restless 0  Easily annoyed or irritable 1  Afraid - awful might happen 1  Total GAD 7 Score 3  Anxiety Difficulty Not difficult at all   Assessment/ Plan: 65 y.o. female   Type 2 diabetes mellitus with microalbuminuria, without long-term current use of insulin (HCC) - Plan: Bayer DCA Hb A1c Waived, Microalbumin / creatinine urine ratio, Lancets (ONETOUCH ULTRASOFT) lancets, glucose blood test strip  Hypertension associated with diabetes ([place]) - Plan: amLODipine (NORVASC) 5 MG tablet  Hyperlipidemia associated with type 2 diabetes mellitus ([place]) - Plan: rosuvastatin (CRESTOR) 10 MG tablet  Fear of bridges - Plan: sertraline (ZOLOFT) 50 MG tablet, LORazepam (ATIVAN) 0.5 MG tablet  Situational anxiety - Plan: sertraline (ZOLOFT) 50 MG tablet, LORazepam (ATIVAN) 0.5 MG tablet  Sugar is borderline at 7.1.  Will allow for lifestyle modifications with no changes made.  Continue metformin, Ozempic.  Almyra Free is working on getting her Ozempic renewed.  A sample was provided to her today  Testing supplies have been sent to pharmacy.  Continue once daily testing  Blood pressure  well controlled.  Not on ACE inhibitor for microalbuminuria due to history of angioedema/anaphylaxis  Continue Crestor 5 mg daily.  Not yet due for fasting lipid  Start Zoloft 50 mg daily, very small quantity of Ativan provided today.  We discussed the dangers of chronic benzodiazepine use (dependency, hallucinations, falls, breathing problems, dementia and even death.)  Medication is to be used only if needed in severe situations.  National narcotic database was reviewed and there were no red flags   No orders of the defined types were placed in this encounter.  No orders of the defined types were placed in this encounter.  Janora Norlander, DO [place] [phone]

## 2020-11-25 NOTE — Telephone Encounter (Signed)
FAXED [facility] REFILLS FOR PATIENT'S [facility] '1MG'$  VIA PATIENT ASSISTANCE FAX [phone] PHONE [phone]

## 2020-11-25 NOTE — Patient Instructions (Signed)
Sertraline: ONCE DAILY EVERY DAY TO PREVENT ANXIETY Lorazepam: ONCE DAILY AS NEEDED FOR BREAKTHROUGH symptoms (going over a bridge)  Taking the medicine as directed and not missing any doses is one of the best things you can do to treat your anxiety.  Here are some things to keep in mind:  Side effects (stomach upset, some increased anxiety) may happen before you notice a benefit.  These side effects typically go away over time. Changes to your dose of medicine or a change in medication all together is sometimes necessary Most people need to be on medication at least 12 months Many people will notice an improvement within two weeks but the full effect of the medication can take up to 4-6 weeks Stopping the medication when you start feeling better often results in a return of symptoms Never discontinue your medication without contacting a health care professional first.  Some medications require gradual discontinuation/ taper and can make you sick if you stop them abruptly.  If your symptoms worsen or you have thoughts of suicide/homicide, PLEASE SEEK IMMEDIATE MEDICAL ATTENTION.  You may always call:  National Suicide Hotline: 4784363728 [place]: [phone] Crisis Recovery in [place]: [phone]   These are available 24 hours a day, 7 days a week.

## 2020-11-26 LAB — MICROALBUMIN / CREATININE URINE RATIO
Creatinine, Urine: 143.4 mg/dL
Microalb/Creat Ratio: 254 mg/g creat — ABNORMAL HIGH (ref 0–29)
Microalbumin, Urine: 364.9 ug/mL

## 2020-12-07 ENCOUNTER — Other Ambulatory Visit: Payer: Self-pay | Admitting: Family Medicine

## 2020-12-07 DIAGNOSIS — Z1231 Encounter for screening mammogram for malignant neoplasm of breast: Secondary | ICD-10-CM

## 2020-12-14 ENCOUNTER — Telehealth: Payer: Self-pay | Admitting: Pharmacist

## 2020-12-14 NOTE — Telephone Encounter (Signed)
Not approved for [employer] nordisk needs to see most recent financials Patient to bring to me this week Appreciate assistance from [place], [employer] sample left if fridge to bridge patient's supply  She is on the '1mg'$  sq weekly dose

## 2020-12-19 ENCOUNTER — Encounter: Payer: Self-pay | Admitting: Family Medicine

## 2020-12-19 ENCOUNTER — Ambulatory Visit (INDEPENDENT_AMBULATORY_CARE_PROVIDER_SITE_OTHER): Payer: Medicare Other | Admitting: Family Medicine

## 2020-12-19 ENCOUNTER — Other Ambulatory Visit: Payer: Self-pay

## 2020-12-19 VITALS — BP 154/83 | HR 98 | Temp 97.1°F | Ht 63.0 in | Wt 178.0 lb

## 2020-12-19 DIAGNOSIS — N3000 Acute cystitis without hematuria: Secondary | ICD-10-CM

## 2020-12-19 DIAGNOSIS — R300 Dysuria: Secondary | ICD-10-CM | POA: Diagnosis not present

## 2020-12-19 LAB — URINALYSIS, ROUTINE W REFLEX MICROSCOPIC
Bilirubin, UA: NEGATIVE
Nitrite, UA: POSITIVE — AB
Specific Gravity, UA: 1.02 (ref 1.005–1.030)
Urobilinogen, Ur: 1 mg/dL (ref 0.2–1.0)
pH, UA: 5 (ref 5.0–7.5)

## 2020-12-19 LAB — MICROSCOPIC EXAMINATION
RBC, Urine: >30 /hpf — AB (ref 0–2)
Renal Epithel, UA: NONE SEEN /hpf

## 2020-12-19 MED ORDER — FLUCONAZOLE 150 MG PO TABS: 150.0000 mg | ORAL_TABLET | Freq: Once | ORAL | 0 refills | Status: AC

## 2020-12-19 MED ORDER — CEPHALEXIN 500 MG PO CAPS: 500.0000 mg | ORAL_CAPSULE | Freq: Two times a day (BID) | ORAL | 0 refills | Status: AC

## 2020-12-19 NOTE — Progress Notes (Signed)
Subjective: CC: UTI PCP: Janora Norlander, DO [id number] Adrienne Davidson is a 65 y.o. female presenting to clinic today for:  1. Urinary symptoms Patient reports a few day h/o dysuria, urinary frequency, urgency.no hematuria, fevers, chills, abdominal pain, nausea, vomiting, back pain, vaginal discharge.  Patient has used AZO, Monistat for symptoms.       ROS: Per HPI  Allergies  Allergen Reactions   Enalapril Anaphylaxis    Angioedema Aug 2018   Past Medical History:  Diagnosis Date   Allergic rhinitis    Diabetes mellitus (HCC)    Hyperlipidemia    Hypertension    Psoriasis    Bilateral feet   Sleep apnea    Sleep apnea    Vitamin D deficiency     Current Outpatient Medications:    amLODipine (NORVASC) 5 MG tablet, Take 1 tablet (5 mg total) by mouth daily., Disp: 90 tablet, Rfl: 3   aspirin 81 MG EC tablet, Take 81 mg by mouth daily. Swallow whole., Disp: , Rfl:    calcipotriene (DOVONOX) 0.005 % cream, Apply 1 application topically 2 (two) times daily., Disp: , Rfl:    Coenzyme Q10 100 MG capsule, Take by mouth., Disp: , Rfl:    Cranberry 250 MG CAPS, Take by mouth., Disp: , Rfl:    EPINEPHrine 0.3 mg/0.3 mL IJ SOAJ injection, Inject 0.3 mg into the muscle as needed for anaphylaxis. Then call 911, Disp: 2 each, Rfl: 0   glucose blood test strip, Test blood sugar once daily (one touch verio if covered by ins/ otherwise whatever ins preference is) E11.65, Disp: 100 each, Rfl: 12   Krill Oil 500 MG CAPS, Take by mouth., Disp: , Rfl:    Lancets (ONETOUCH ULTRASOFT) lancets, Use once daily as instructed E11.65 (one touch verio), Disp: 100 each, Rfl: 12   LORazepam (ATIVAN) 0.5 MG tablet, Take 0.5-1 tablets (0.25-0.5 mg total) by mouth 2 (two) times daily as needed for anxiety., Disp: 10 tablet, Rfl: 0   metFORMIN (GLUCOPHAGE) 1000 MG tablet, Take 1 tablet (1,000 mg total) by mouth 2 (two) times daily with a meal., Disp: 180 tablet, Rfl: 3   Multiple Vitamins-Minerals (CENTRUM  ADULTS) TABS, Take by mouth., Disp: , Rfl:    OZEMPIC, 1 MG/DOSE, 4 MG/3ML SOPN, Inject 1 mg into the skin once a week., Disp: 3 mL, Rfl: 0   rosuvastatin (CRESTOR) 10 MG tablet, Take 0.5 tablets (5 mg total) by mouth daily., Disp: 45 tablet, Rfl: 3   sertraline (ZOLOFT) 50 MG tablet, Take 1 tablet (50 mg total) by mouth daily., Disp: 90 tablet, Rfl: 0 Social History   Socioeconomic History   Marital status: Married    Spouse name: Adrienne Davidson   Number of children: 2   Years of education: 12   Highest education level: Associate degree: occupational, Hotel manager, or vocational program  Occupational History   Occupation: paralegal    Comment: retired  Tobacco Use   Smoking status: Former    Packs/day: 0.25    Years: 25.00    Pack years: 6.25    Types: Cigarettes    Quit date: 06/27/2018    Years since quitting: 2.4   Smokeless tobacco: Never  Vaping Use   Vaping Use: Never used  Substance and Sexual Activity   Alcohol use: Not Currently    Comment: wine once per year   Drug use: Never   Sexual activity: Not on file  Other Topics Concern   Not on file  Social History Narrative  Not on file   Social Determinants of Health   Financial Resource Strain: Low Risk    Difficulty of Paying Living Expenses: Not hard at all  Food Insecurity: No Food Insecurity   Worried About Charity fundraiser in the Last Year: Never true   [place] in the Last Year: Never true  Transportation Needs: No Transportation Needs   Lack of Transportation (Medical): No   Lack of Transportation (Non-Medical): No  Physical Activity: Inactive   Days of Exercise per Week: 0 days   Minutes of Exercise per Session: 0 min  Stress: No Stress Concern Present   Feeling of Stress : Only a little  Social Connections: Engineer, building services of Communication with Friends and Family: More than three times a week   Frequency of Social Gatherings with Friends and Family: More than three times a week    Attends Religious Services: More than 4 times per year   Active Member of [employer] or Organizations: Yes   Attends Music therapist: More than 4 times per year   Marital Status: Married  Human resources officer Violence: Not At Risk   Fear of Current or Ex-Partner: No   Emotionally Abused: No   Physically Abused: No   Sexually Abused: No   Family History  Problem Relation Age of Onset   Heart disease Father    Non-Hodgkin's lymphoma Mother    Diabetes Daughter    Rheum arthritis Daughter    Polycystic ovary syndrome Daughter     Objective: Office vital signs reviewed. BP (!) 154/83   Pulse 98   Temp (!) 97.1 F (36.2 C)   Ht '5\' 3"'$  (1.6 m)   Wt 178 lb (80.7 kg)   SpO2 97%   BMI 31.53 kg/m   Physical Examination:  General: Awake, alert, well-appearing female GU: No CVA tenderness palpation.  No suprapubic tenderness to palpation.  Assessment/ Plan: 65 y.o. female   Acute cystitis without hematuria - Plan: Urinalysis, Routine w reflex microscopic, cephALEXin (KEFLEX) 500 MG capsule, fluconazole (DIFLUCAN) 150 MG tablet  Start Keflex 500 mg twice daily.  Fluconazole sent.  Follow-up as needed.  Further recommendations pending urine culture  No orders of the defined types were placed in this encounter.  No orders of the defined types were placed in this encounter.    Janora Norlander, DO [place] [phone]

## 2020-12-19 NOTE — Patient Instructions (Signed)

## 2020-12-25 LAB — URINE CULTURE

## 2020-12-27 NOTE — Progress Notes (Signed)
Pt returning call about urine

## 2021-01-18 ENCOUNTER — Other Ambulatory Visit: Payer: Self-pay

## 2021-01-18 ENCOUNTER — Ambulatory Visit
Admission: RE | Admit: 2021-01-18 | Discharge: 2021-01-18 | Disposition: A | Discharge status: Home / Self Care | Payer: Medicare Other | Source: Ambulatory Visit | Attending: Family Medicine | Admitting: Family Medicine

## 2021-01-18 DIAGNOSIS — Z1231 Encounter for screening mammogram for malignant neoplasm of breast: Secondary | ICD-10-CM

## 2021-02-27 ENCOUNTER — Other Ambulatory Visit: Payer: Self-pay

## 2021-02-27 ENCOUNTER — Ambulatory Visit (INDEPENDENT_AMBULATORY_CARE_PROVIDER_SITE_OTHER): Payer: Medicare Other | Admitting: Family Medicine

## 2021-02-27 ENCOUNTER — Encounter: Payer: Self-pay | Admitting: Family Medicine

## 2021-02-27 ENCOUNTER — Telehealth: Payer: Self-pay | Admitting: Family Medicine

## 2021-02-27 VITALS — BP 111/60 | HR 69 | Temp 98.3°F | Resp 20 | Ht 63.0 in | Wt 178.0 lb

## 2021-02-27 DIAGNOSIS — E785 Hyperlipidemia, unspecified: Secondary | ICD-10-CM | POA: Diagnosis not present

## 2021-02-27 DIAGNOSIS — E1169 Type 2 diabetes mellitus with other specified complication: Secondary | ICD-10-CM

## 2021-02-27 DIAGNOSIS — Z23 Encounter for immunization: Secondary | ICD-10-CM | POA: Diagnosis not present

## 2021-02-27 DIAGNOSIS — E1159 Type 2 diabetes mellitus with other circulatory complications: Secondary | ICD-10-CM | POA: Diagnosis not present

## 2021-02-27 DIAGNOSIS — E1129 Type 2 diabetes mellitus with other diabetic kidney complication: Secondary | ICD-10-CM | POA: Diagnosis not present

## 2021-02-27 DIAGNOSIS — I152 Hypertension secondary to endocrine disorders: Secondary | ICD-10-CM

## 2021-02-27 DIAGNOSIS — R809 Proteinuria, unspecified: Secondary | ICD-10-CM | POA: Diagnosis not present

## 2021-02-27 LAB — BASIC METABOLIC PANEL
BUN/Creatinine Ratio: 19 (ref 12–28)
BUN: 17 mg/dL (ref 8–27)
CO2: 25 mmol/L (ref 20–29)
Calcium: 10.1 mg/dL (ref 8.7–10.3)
Chloride: 104 mmol/L (ref 96–106)
Creatinine, Ser: 0.89 mg/dL (ref 0.57–1.00)
Glucose: 135 mg/dL — ABNORMAL HIGH (ref 70–99)
Potassium: 5 mmol/L (ref 3.5–5.2)
Sodium: 142 mmol/L (ref 134–144)
eGFR: 71 mL/min/{1.73_m2} (ref >59)

## 2021-02-27 LAB — BAYER DCA HB A1C WAIVED: HB A1C (BAYER DCA - WAIVED): 6.9 % — ABNORMAL HIGH (ref 4.8–5.6)

## 2021-02-27 MED ORDER — ROSUVASTATIN CALCIUM 10 MG PO TABS: 10.0000 mg | ORAL_TABLET | Freq: Every day | ORAL | 3 refills | Status: DC

## 2021-02-27 MED ORDER — TRULICITY 3 MG/0.5ML ~~LOC~~ SOAJ: 3.0000 mg | SUBCUTANEOUS | 12 refills | Status: DC

## 2021-02-27 NOTE — Patient Instructions (Addendum)
You had labs performed today.  You will be contacted with the results of the labs once they are available, usually in the next 3 business days for routine lab work.  If you have an active my chart account, they will be released to your MyChart.  If you prefer to have these labs released to you via telephone, please let us know.  Sugar is controlled.  Call me with your Trulicity dose so I can send to your pharmacy.

## 2021-02-27 NOTE — Addendum Note (Signed)
Addended by: Janora Norlander on: 02/27/2021 12:53 PM   Modules accepted: Orders

## 2021-02-27 NOTE — Telephone Encounter (Signed)
Trulicity sent

## 2021-02-27 NOTE — Progress Notes (Signed)
Subjective: CC: DM PCP: Janora Norlander, DO [id number] Adrienne Davidson is a 65 y.o. female presenting to clinic today for:  1. Type 2 Diabetes with hypertension, hyperlipidemia and microalbuminuria:  Not actively checking her blood sugars at home but has been compliant with her Trulicity.  She notes that this was switched from [place] recently because she no longer qualifies for patient assistance for Ozempic.  She is not sure the dose that she is injecting but will contact me with this.  Compliant with her Metformin, Norvasc 5mg , Crestor 10 mg daily  Last eye exam: needs Last foot exam: needs Last A1c:  Lab Results  Component Value Date   HGBA1C 7.1 (H) 11/25/2020   Nephropathy screen indicated?: known microalbuminuria Last flu, zoster and/or pneumovax:  Immunization History  Administered Date(s) Administered   Fluad Quad(high Dose 65+) 01/22/2019, 01/22/2020   Influenza Split 02/06/2017   Influenza,inj,Quad PF,6+ Mos 12/20/2017   Moderna Sars-Covid-2 Vaccination 05/20/2019, 06/17/2019, 03/02/2020   Pneumococcal Conjugate-13 10/27/2018   Pneumococcal Polysaccharide-23 12/20/2015   Zoster Recombinat (Shingrix) 10/27/2018, 01/01/2019    ROS: Denies dizziness, LOC, polyuria, polydipsia, unintended weight loss/gain, foot ulcerations, numbness or tingling in extremities, shortness of breath or chest pain.    ROS: Per HPI  Allergies  Allergen Reactions   Enalapril Anaphylaxis    Angioedema Aug 2018   Past Medical History:  Diagnosis Date   Allergic rhinitis    Diabetes mellitus (HCC)    Hyperlipidemia    Hypertension    Psoriasis    Bilateral feet   Sleep apnea    Sleep apnea    Vitamin D deficiency     Current Outpatient Medications:    amLODipine (NORVASC) 5 MG tablet, Take 1 tablet (5 mg total) by mouth daily., Disp: 90 tablet, Rfl: 3   aspirin 81 MG EC tablet, Take 81 mg by mouth daily. Swallow whole., Disp: , Rfl:    calcipotriene (DOVONOX) 0.005 % cream, Apply  1 application topically 2 (two) times daily., Disp: , Rfl:    Coenzyme Q10 100 MG capsule, Take by mouth., Disp: , Rfl:    Cranberry 250 MG CAPS, Take by mouth., Disp: , Rfl:    EPINEPHrine 0.3 mg/0.3 mL IJ SOAJ injection, Inject 0.3 mg into the muscle as needed for anaphylaxis. Then call 911, Disp: 2 each, Rfl: 0   glucose blood test strip, Test blood sugar once daily (one touch verio if covered by ins/ otherwise whatever ins preference is) E11.65, Disp: 100 each, Rfl: 12   Krill Oil 500 MG CAPS, Take by mouth., Disp: , Rfl:    Lancets (ONETOUCH ULTRASOFT) lancets, Use once daily as instructed E11.65 (one touch verio), Disp: 100 each, Rfl: 12   LORazepam (ATIVAN) 0.5 MG tablet, Take 0.5-1 tablets (0.25-0.5 mg total) by mouth 2 (two) times daily as needed for anxiety., Disp: 10 tablet, Rfl: 0   metFORMIN (GLUCOPHAGE) 1000 MG tablet, Take 1 tablet (1,000 mg total) by mouth 2 (two) times daily with a meal., Disp: 180 tablet, Rfl: 3   Multiple Vitamins-Minerals (CENTRUM ADULTS) TABS, Take by mouth., Disp: , Rfl:    OZEMPIC, 1 MG/DOSE, 4 MG/3ML SOPN, Inject 1 mg into the skin once a week., Disp: 3 mL, Rfl: 0   rosuvastatin (CRESTOR) 10 MG tablet, Take 0.5 tablets (5 mg total) by mouth daily., Disp: 45 tablet, Rfl: 3   sertraline (ZOLOFT) 50 MG tablet, Take 1 tablet (50 mg total) by mouth daily., Disp: 90 tablet, Rfl: 0 Social History  Socioeconomic History   Marital status: Married    Spouse name: [name]   Number of children: 2   Years of education: 12   Highest education level: Associate degree: occupational, Hotel manager, or vocational program  Occupational History   Occupation: paralegal    Comment: retired  Tobacco Use   Smoking status: Former    Packs/day: 0.25    Years: 25.00    Pack years: 6.25    Types: Cigarettes    Quit date: 06/27/2018    Years since quitting: 2.6   Smokeless tobacco: Never  Vaping Use   Vaping Use: Never used  Substance and Sexual Activity   Alcohol use: Not  Currently    Comment: wine once per year   Drug use: Never   Sexual activity: Not on file  Other Topics Concern   Not on file  Social History Narrative   Not on file   Social Determinants of Health   Financial Resource Strain: Not on file  Food Insecurity: Not on file  Transportation Needs: Not on file  Physical Activity: Not on file  Stress: Not on file  Social Connections: Not on file  Intimate Partner Violence: Not on file   Family History  Problem Relation Age of Onset   Non-Hodgkin's lymphoma Mother    Heart disease Father    Diabetes Daughter    Rheum arthritis Daughter    Polycystic ovary syndrome Daughter    Breast cancer Neg Hx     Objective: Office vital signs reviewed. BP 111/60   Pulse 69   Temp 98.3 F (36.8 C)   Resp 20   Ht 5\' 3"  (1.6 m)   Wt 178 lb (80.7 kg)   SpO2 97%   BMI 31.53 kg/m   Physical Examination:  General: Awake, alert, well nourished, No acute distress HEENT: Normal; sclera white Cardio: regular rate and rhythm, S1S2 heard, no murmurs appreciated Pulm: clear to auscultation bilaterally, no wheezes, rhonchi or rales; normal work of breathing on room air Extremities: warm, well perfused, No edema, cyanosis or clubbing; +2 pulses bilaterally Neuro: See DM foot  Diabetic Foot Exam - Simple   Simple Foot Form Diabetic Foot exam was performed with the following findings: Yes 02/27/2021  8:47 AM  Visual Inspection No deformities, no ulcerations, no other skin breakdown bilaterally: Yes Sensation Testing Intact to touch and monofilament testing bilaterally: Yes Pulse Check Posterior Tibialis and Dorsalis pulse intact bilaterally: Yes Comments     Assessment/ Plan: 65 y.o. female   Type 2 diabetes mellitus with microalbuminuria, without long-term current use of insulin (HCC) - Plan: Bayer DCA Hb A1c Waived, Basic Metabolic Panel  Hypertension associated with diabetes ([place])  Hyperlipidemia associated with type 2 diabetes  mellitus ([place]) - Plan: rosuvastatin (CRESTOR) 10 MG tablet  Need for influenza vaccination  Sugar remains controlled with A1c of 6.9.  She will contact me with which Trulicity dose she is on so that incensed to the pharmacy.  We will also CC pharmacy to see if perhaps this is something they were planning on getting through the manufacturer of her  Blood pressure is controlled.  No changes  Continue Crestor.  I have updated the dosage.  Not yet due for fasting labs  Influenza vaccination administered  Orders Placed This Encounter  Procedures   Bayer DCA Hb A1c Waived   Basic Metabolic Panel   No orders of the defined types were placed in this encounter.    Janora Norlander, DO [facility] [phone]

## 2021-03-02 ENCOUNTER — Other Ambulatory Visit: Payer: Self-pay | Admitting: Family Medicine

## 2021-03-02 DIAGNOSIS — F418 Other specified anxiety disorders: Secondary | ICD-10-CM

## 2021-03-02 DIAGNOSIS — F40242 Fear of bridges: Secondary | ICD-10-CM

## 2021-05-24 NOTE — Progress Notes (Signed)
Received notification from [place] regarding approval for TRULICITY 3MG /0.5ML. Patient assistance approved from 05/23/21 to 04/22/22.  [facility] WILL REACH OUT TO PT TO [place]  Phone: [phone]

## 2021-06-07 ENCOUNTER — Other Ambulatory Visit: Payer: Self-pay | Admitting: Family Medicine

## 2021-06-07 DIAGNOSIS — F418 Other specified anxiety disorders: Secondary | ICD-10-CM

## 2021-06-07 DIAGNOSIS — F40242 Fear of bridges: Secondary | ICD-10-CM

## 2021-06-27 ENCOUNTER — Ambulatory Visit (INDEPENDENT_AMBULATORY_CARE_PROVIDER_SITE_OTHER): Payer: Medicare Other | Admitting: Family Medicine

## 2021-06-27 ENCOUNTER — Encounter: Payer: Self-pay | Admitting: Family Medicine

## 2021-06-27 ENCOUNTER — Ambulatory Visit (INDEPENDENT_AMBULATORY_CARE_PROVIDER_SITE_OTHER): Payer: Medicare Other

## 2021-06-27 VITALS — BP 112/66 | HR 77 | Temp 96.5°F | Ht 63.0 in | Wt 181.2 lb

## 2021-06-27 DIAGNOSIS — R0609 Other forms of dyspnea: Secondary | ICD-10-CM

## 2021-06-27 DIAGNOSIS — E1159 Type 2 diabetes mellitus with other circulatory complications: Secondary | ICD-10-CM | POA: Diagnosis not present

## 2021-06-27 DIAGNOSIS — Z87891 Personal history of nicotine dependence: Secondary | ICD-10-CM

## 2021-06-27 DIAGNOSIS — M8588 Other specified disorders of bone density and structure, other site: Secondary | ICD-10-CM

## 2021-06-27 DIAGNOSIS — R809 Proteinuria, unspecified: Secondary | ICD-10-CM

## 2021-06-27 DIAGNOSIS — Z780 Asymptomatic menopausal state: Secondary | ICD-10-CM

## 2021-06-27 DIAGNOSIS — E785 Hyperlipidemia, unspecified: Secondary | ICD-10-CM

## 2021-06-27 DIAGNOSIS — E1129 Type 2 diabetes mellitus with other diabetic kidney complication: Secondary | ICD-10-CM | POA: Diagnosis not present

## 2021-06-27 DIAGNOSIS — I152 Hypertension secondary to endocrine disorders: Secondary | ICD-10-CM

## 2021-06-27 DIAGNOSIS — R0600 Dyspnea, unspecified: Secondary | ICD-10-CM | POA: Diagnosis not present

## 2021-06-27 DIAGNOSIS — M8589 Other specified disorders of bone density and structure, multiple sites: Secondary | ICD-10-CM | POA: Diagnosis not present

## 2021-06-27 DIAGNOSIS — Z23 Encounter for immunization: Secondary | ICD-10-CM | POA: Diagnosis not present

## 2021-06-27 DIAGNOSIS — E1169 Type 2 diabetes mellitus with other specified complication: Secondary | ICD-10-CM | POA: Diagnosis not present

## 2021-06-27 DIAGNOSIS — M8580 Other specified disorders of bone density and structure, unspecified site: Secondary | ICD-10-CM | POA: Diagnosis not present

## 2021-06-27 DIAGNOSIS — F418 Other specified anxiety disorders: Secondary | ICD-10-CM

## 2021-06-27 DIAGNOSIS — F40242 Fear of bridges: Secondary | ICD-10-CM

## 2021-06-27 LAB — CMP14+EGFR
ALT: 15 IU/L (ref 0–32)
AST: 18 IU/L (ref 0–40)
Albumin/Globulin Ratio: 2.5 — ABNORMAL HIGH (ref 1.2–2.2)
Albumin: 4.5 g/dL (ref 3.8–4.8)
Alkaline Phosphatase: 66 IU/L (ref 44–121)
BUN/Creatinine Ratio: 21 (ref 12–28)
BUN: 18 mg/dL (ref 8–27)
Bilirubin Total: 0.4 mg/dL (ref 0.0–1.2)
CO2: 22 mmol/L (ref 20–29)
Calcium: 9.9 mg/dL (ref 8.7–10.3)
Chloride: 102 mmol/L (ref 96–106)
Creatinine, Ser: 0.84 mg/dL (ref 0.57–1.00)
Globulin, Total: 1.8 g/dL (ref 1.5–4.5)
Glucose: 148 mg/dL — ABNORMAL HIGH (ref 70–99)
Potassium: 4.7 mmol/L (ref 3.5–5.2)
Sodium: 141 mmol/L (ref 134–144)
Total Protein: 6.3 g/dL (ref 6.0–8.5)
eGFR: 76 mL/min/{1.73_m2} (ref >59)

## 2021-06-27 LAB — LIPID PANEL
Chol/HDL Ratio: 2.5 ratio (ref 0.0–4.4)
Cholesterol, Total: 139 mg/dL (ref 100–199)
HDL: 55 mg/dL (ref >39)
LDL Chol Calc (NIH): 62 mg/dL (ref 0–99)
Triglycerides: 123 mg/dL (ref 0–149)
VLDL Cholesterol Cal: 22 mg/dL (ref 5–40)

## 2021-06-27 LAB — BAYER DCA HB A1C WAIVED: HB A1C (BAYER DCA - WAIVED): 7 % — ABNORMAL HIGH (ref 4.8–5.6)

## 2021-06-27 MED ORDER — SERTRALINE HCL 50 MG PO TABS: 50.0000 mg | ORAL_TABLET | Freq: Every day | ORAL | 1 refills | Status: DC

## 2021-06-27 NOTE — Progress Notes (Signed)
? ?Subjective: ?CC:DM ?PCP: Janora Norlander, DO ?Adrienne Davidson is a 66 y.o. female presenting to clinic today for: ? ?1. Type 2 Diabetes with hypertension, hyperlipidemia:  ?She reports compliance with her Trulicity, which she is getting from patient assistance.  She takes metformin, Crestor and Norvasc.  Has appointment scheduled with her eye doctor on 07/11/2021.  No reports of chest pain but she does report some dyspnea on exertion and would like to have a baseline chest x-ray.  She is a former smoker that smoked on and off for about 20 years.  She was smoking roughly a pack per day.  Denies any night sweats, unplanned weight loss, hemoptysis. ? ?Last eye exam: needs ?Last foot exam: UTD ?Last A1c:  ?Lab Results  ?Component Value Date  ? HGBA1C 6.9 (H) 02/27/2021  ? ?Nephropathy screen indicated?: UTD ?Last flu, zoster and/or pneumovax:  ?Immunization History  ?Administered Date(s) Administered  ? Fluad Quad(high Dose 65+) 01/22/2019, 01/22/2020, 02/27/2021  ? Influenza Split 02/06/2017  ? Influenza,inj,Quad PF,6+ Mos 12/20/2017  ? Moderna Covid-19 Vaccine Bivalent Booster 63yr & up 02/13/2021  ? Moderna Sars-Covid-2 Vaccination 05/20/2019, 06/17/2019, 03/02/2020  ? Pneumococcal Conjugate-13 10/27/2018  ? Pneumococcal Polysaccharide-23 12/20/2015  ? Zoster Recombinat (Shingrix) 10/27/2018, 01/01/2019  ? ? ?2. Osteopenia ?Last DEXA scan performed about 2 years ago.  Needs renewal ? ? ?ROS: Per HPI ? ?Allergies  ?Allergen Reactions  ? Enalapril Anaphylaxis  ?  Angioedema Aug 2018  ? ?Past Medical History:  ?Diagnosis Date  ? Allergic rhinitis   ? Diabetes mellitus ([place]   ? Hyperlipidemia   ? Hypertension   ? Psoriasis   ? Bilateral feet  ? Sleep apnea   ? Sleep apnea   ? Vitamin D deficiency   ? ? ?Current Outpatient Medications:  ?  amLODipine (NORVASC) 5 MG tablet, Take 1 tablet (5 mg total) by mouth daily., Disp: 90 tablet, Rfl: 3 ?  Coenzyme Q10 100 MG capsule, Take by mouth., Disp: , Rfl:  ?   Cranberry 250 MG CAPS, Take by mouth., Disp: , Rfl:  ?  Dulaglutide (TRULICITY) 3 MTR/7.1HASOPN, Inject 3 mg as directed once a week., Disp: 2 mL, Rfl: 12 ?  EPINEPHrine 0.3 mg/0.3 mL IJ SOAJ injection, Inject 0.3 mg into the muscle as needed for anaphylaxis. Then call 911, Disp: 2 each, Rfl: 0 ?  glucose blood test strip, Test blood sugar once daily (one touch verio if covered by ins/ otherwise whatever ins preference is) E11.65, Disp: 100 each, Rfl: 12 ?  Krill Oil 500 MG CAPS, Take by mouth., Disp: , Rfl:  ?  Lancets (ONETOUCH ULTRASOFT) lancets, Use once daily as instructed E11.65 (one touch verio), Disp: 100 each, Rfl: 12 ?  LORazepam (ATIVAN) 0.5 MG tablet, Take 0.5-1 tablets (0.25-0.5 mg total) by mouth 2 (two) times daily as needed for anxiety., Disp: 10 tablet, Rfl: 0 ?  metFORMIN (GLUCOPHAGE) 1000 MG tablet, Take 1 tablet (1,000 mg total) by mouth 2 (two) times daily with a meal., Disp: 180 tablet, Rfl: 3 ?  Multiple Vitamins-Minerals (CENTRUM ADULTS) TABS, Take by mouth., Disp: , Rfl:  ?  rosuvastatin (CRESTOR) 10 MG tablet, Take 1 tablet (10 mg total) by mouth daily., Disp: 90 tablet, Rfl: 3 ?  sertraline (ZOLOFT) 50 MG tablet, Take 1 tablet (50 mg total) by mouth daily., Disp: 90 tablet, Rfl: 1 ?Social History  ? ?Socioeconomic History  ? Marital status: Married  ?  Spouse name: TCoralyn Mark ? Number of children:  2  ? Years of education: 57  ? Highest education level: Associate degree: occupational, Hotel manager, or vocational program  ?Occupational History  ? 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 Smoking status: Former  ?  Packs/day: 0.25  ?  Years: 25.00  ?  Pack years: 6.25  ?  Types: Cigarettes  ?  Quit date: 06/27/2018  ?  Years since quitting: 3.0  ? Smokeless tobacco: Never  ?Vaping Use  ? Vaping Use: Never used  ?Substance and Sexual Activity  ? Alcohol use: Not Currently  ?  Comment: wine once per year  ? Drug use: Never  ? Sexual activity: Not on file  ?Other Topics Concern  ? Not  on file  ?Social History Narrative  ? Not on file  ? ?Social Determinants of Health  ? ?Financial Resource Strain: Not on file  ?Food Insecurity: Not on file  ?Transportation Needs: Not on file  ?Physical Activity: Not on file  ?Stress: Not on file  ?Social Connections: Not on file  ?Intimate Partner Violence: Not on file  ? ?Family History  ?Problem Relation Age of Onset  ? Non-Hodgkin's lymphoma Mother   ? Heart disease Father   ? Diabetes Daughter   ? Rheum arthritis Daughter   ? Polycystic ovary syndrome Daughter   ? Breast cancer Neg Hx   ? ? ?Objective: ?Office vital signs reviewed. ?BP 112/66   Pulse 77   Temp (!) 96.5 ?F (35.8 ?C) (Temporal)   Ht _0  (1.6 m)   Wt 181 lb 3.2 oz (82.2 kg)   SpO2 98%   BMI 32.10 kg/m?  ? ?Physical Examination:  ?General: Awake, alert, well nourished, No acute distress ?HEENT: sclera white, MMM ?Cardio: regular rate and rhythm, S1S2 heard, no murmurs appreciated ?Pulm: Coarse breath sounds noted in the right upper lung field.  Otherwise clear to auscultation bilaterally with normal work of breathing on room air ? ?Assessment/ Plan: ?66 y.o. female  ? ?Type 2 diabetes mellitus with microalbuminuria, without long-term current use of insulin (HCC) - Plan: Bayer DCA Hb A1c Waived ? ?Hypertension associated with diabetes ([place]) - Plan: CMP14+EGFR ? ?Hyperlipidemia associated with type 2 diabetes mellitus ([place]) - Plan: Lipid Panel, CMP14+EGFR ? ?Osteopenia after menopause - Plan: DG [facility] DEXA ? ?Fear of bridges - Plan: sertraline (ZOLOFT) 50 MG tablet ? ?Situational anxiety - Plan: sertraline (ZOLOFT) 50 MG tablet ? ?Former smoker - Plan: DG Chest 2 View ? ?Dyspnea on exertion - Plan: DG Chest 2 View ? ?Sugar remains under good control with A1c of 7.0 today.  She is up-to-date on other preventative health except for her diabetic eye exam which is scheduled for about 2 weeks from now ? ?Blood pressure controlled.  No changes ? ?Check fasting lipid, CMP ? ?We will update her DEXA  scan today ? ?Anxiety disorder is stable with Zoloft.  She has had no use for the Ativan but does have it on hand just in case ? ?Lung exam was remarkable for coarse breath sounds in the right upper lung fields.  Obtain chest x-ray given smoking history and reports of dyspnea on exertion ? ?No orders of the defined types were placed in this encounter. ? ?No orders of the defined types were placed in this encounter. ? ? ? ?Janora Norlander, DO ?[place] ?[phone] ? ? ?

## 2021-06-27 NOTE — Addendum Note (Signed)
Addended by: Colman Cater on: 06/27/2021 08:53 AM ? ? Modules accepted: Orders ? ?

## 2021-07-10 ENCOUNTER — Other Ambulatory Visit: Payer: Self-pay | Admitting: Family Medicine

## 2021-07-10 DIAGNOSIS — I152 Hypertension secondary to endocrine disorders: Secondary | ICD-10-CM

## 2021-07-11 LAB — HM DIABETES EYE EXAM

## 2021-07-17 ENCOUNTER — Ambulatory Visit (INDEPENDENT_AMBULATORY_CARE_PROVIDER_SITE_OTHER): Payer: Medicare Other

## 2021-07-17 VITALS — Wt 181.0 lb

## 2021-07-17 DIAGNOSIS — Z0000 Encounter for general adult medical examination without abnormal findings: Secondary | ICD-10-CM

## 2021-07-17 NOTE — Progress Notes (Signed)
? ?Subjective:  ? Adrienne Davidson is a 66 y.o. female who presents for Medicare Annual (Subsequent) preventive examination. ? ?Virtual Visit via Telephone Note ? ?I connected with  Adrienne Davidson on 07/17/21 at 10:30 AM EDT by telephone and verified that I am speaking with the correct person using two identifiers. ? ?Location: ?Patient: Home ?Provider: [facility] ?Persons participating in the virtual visit: patient/Nurse Health Advisor ?  ?I discussed the limitations, risks, security and privacy concerns of performing an evaluation and management service by telephone and the availability of in person appointments. The patient expressed understanding and agreed to proceed. ? ?Interactive audio and video telecommunications were attempted between this nurse and patient, however failed, due to patient having technical difficulties OR patient did not have access to video capability.  We continued and completed visit with audio only. ? ?Some vital signs may be absent or patient reported.  ? ?Tamella Tuccillo Dionne Ano, LPN  ? ?Review of Systems    ? ?Cardiac Risk Factors include: advanced age (>72mn, >>32women);diabetes mellitus;dyslipidemia;hypertension;obesity (BMI >30kg/m2);Other (see comment), Risk factor comments: OSA on CPAP ? ?   ?Objective:  ?  ?Today's Vitals  ? 07/17/21 1024  ?Weight: 181 lb (82.1 kg)  ? ?Body mass index is 32.06 kg/m?. ? ? ?  07/17/2021  ? 10:39 AM 07/15/2020  ?  8:25 AM 07/15/2019  ?  8:05 AM  ?Advanced Directives  ?Does Patient Have a Medical Advance Directive? Yes Yes Yes  ?Type of [place]amed[place]of [place];Out of facility DNR (pink MOST or yellow form) Living will;Healthcare Power of Attorney  ?Does patient want to make changes to medical advance directive?   No - Patient declined  ?Copy of HCl[place]in Chart? No - copy requested No - copy requested   ? ? ?Current Medications (verified) ?Outpatient Encounter  Medications as of 07/17/2021  ?Medication Sig  ? amLODipine (NORVASC) 5 MG tablet Take 1 tablet (5 mg total) by mouth daily.  ? Coenzyme Q10 100 MG capsule Take by mouth.  ? Cranberry 250 MG CAPS Take by mouth.  ? Dulaglutide (TRULICITY) 3 MWN/0.2VOSOPN Inject 3 mg as directed once a week.  ? EPINEPHrine 0.3 mg/0.3 mL IJ SOAJ injection Inject 0.3 mg into the muscle as needed for anaphylaxis. Then call 911  ? glucose blood test strip Test blood sugar once daily (one touch verio if covered by ins/ otherwise whatever ins preference is) E11.65  ? Krill Oil 500 MG CAPS Take by mouth.  ? Lancets (ONETOUCH ULTRASOFT) lancets Use once daily as instructed E11.65 (one touch verio)  ? LORazepam (ATIVAN) 0.5 MG tablet Take 0.5-1 tablets (0.25-0.5 mg total) by mouth 2 (two) times daily as needed for anxiety.  ? metFORMIN (GLUCOPHAGE) 1000 MG tablet Take 1 tablet (1,000 mg total) by mouth 2 (two) times daily with a meal.  ? Multiple Vitamins-Minerals (CENTRUM ADULTS) TABS Take by mouth.  ? rosuvastatin (CRESTOR) 10 MG tablet Take 1 tablet (10 mg total) by mouth daily.  ? sertraline (ZOLOFT) 50 MG tablet Take 1 tablet (50 mg total) by mouth daily.  ? ?No facility-administered encounter medications on file as of 07/17/2021.  ? ? ?Allergies (verified) ?Enalapril  ? ?History: ?Past Medical History:  ?Diagnosis Date  ? Allergic rhinitis   ? Allergy   ? Anxiety   ? Arthritis   ? Diabetes mellitus ([place]   ? Hyperlipidemia   ? Hypertension   ? Psoriasis   ?  Bilateral feet  ? Sleep apnea   ? Sleep apnea   ? Vitamin D deficiency   ? ?Past Surgical History:  ?Procedure Laterality Date  ? APPENDECTOMY    ? [place]  ? [place]  ? TONSILLECTOMY    ? ?Family History  ?Problem Relation Age of Onset  ? Non-Hodgkin's lymphoma Mother   ? Arthritis Mother   ? Cancer Mother   ? Heart disease Father   ? Arthritis Father   ? COPD Father   ? Hypertension Father   ? Diabetes Daughter   ? Rheum arthritis Daughter   ? Polycystic  ovary syndrome Daughter   ? Breast cancer Neg Hx   ? ?Social History  ? ?Socioeconomic History  ? Marital status: Married  ?  Spouse name: [name]  ? Number of children: 2  ? Years of education: 27  ? Highest education level: Associate degree: occupational, Hotel manager, or vocational program  ?Occupational History  ? 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 Smoking status: Former  ?  Packs/day: 0.25  ?  Years: 25.00  ?  Pack years: 6.25  ?  Types: Cigarettes  ?  Quit date: 06/27/2018  ?  Years since quitting: 3.0  ? Smokeless tobacco: Never  ?Vaping Use  ? Vaping Use: Never used  ?Substance and Sexual Activity  ? Alcohol use: Not Currently  ?  Comment: wine once per year  ? Drug use: Not Currently  ? Sexual activity: Not Currently  ?  Birth control/protection: None  ?Other Topics Concern  ? Not on file  ?Social History Narrative  ? Not on file  ? ?Social Determinants of Health  ? ?Financial Resource Strain: Low Risk   ? Difficulty of Paying Living Expenses: Not hard at all  ?Food Insecurity: No Food Insecurity  ? Worried About Charity fundraiser in the Last Year: Never true  ? Ran Out of Food in the Last Year: Never true  ?Transportation Needs: No Transportation Needs  ? Lack of Transportation (Medical): No  ? Lack of Transportation (Non-Medical): No  ?Physical Activity: Sufficiently Active  ? Days of Exercise per Week: 4 days  ? Minutes of Exercise per Session: 60 min  ?Stress: No Stress Concern Present  ? Feeling of Stress : Only a little  ?Social Connections: Moderately Integrated  ? Frequency of Communication with Friends and Family: Twice a week  ? Frequency of Social Gatherings with Friends and Family: Twice a week  ? Attends Religious Services: More than 4 times per year  ? Active Member of Clubs or Organizations: No  ? Attends Archivist Meetings: Patient refused  ? Marital Status: Married  ? ? ?Tobacco Counseling ?Counseling given: Not Answered ? ? ?Clinical Intake: ? ?Pre-visit  preparation completed: Yes ? ?Pain : No/denies pain ? ?  ? ?BMI - recorded: 32.06 ?Nutritional Status: BMI > 30  Obese ?Nutritional Risks: None ?Diabetes: Yes ?CBG done?: No ?Did pt. bring in CBG monitor from home?: No ? ?How often do you need to have someone help you when you read instructions, pamphlets, or other written materials from your doctor or pharmacy?: 1 - Never ? ?Diabetic?Nutrition Risk Assessment: ? ?Has the patient had any N/V/D within the last 2 months?  No  ?Does the patient have any non-healing wounds?  No  ?Has the patient had any unintentional weight loss or weight gain?  No  ? ?Diabetes: ? ?Is the patient  diabetic?  Yes  ?If diabetic, was a CBG obtained today?  No  ?Did the patient bring in their glucometer from home?  No  ?How often do you monitor your CBG's? When she feels bad or thinks about it - not regularly.  ? ?Financial Strains and Diabetes Management: ? ?Are you having any financial strains with the device, your supplies or your medication? No .  ?Does the patient want to be seen by Chronic Care Management for management of their diabetes?  No  ?Would the patient like to be referred to a Nutritionist or for Diabetic Management?  No  ? ?Diabetic Exams: ? ?Diabetic Eye Exam: Completed 05/10/2020. Overdue for diabetic eye exam. Pt has been advised about the importance in completing this exam. A referral has been placed today. She has appt coming up soon. ? ?Diabetic Foot Exam: Completed 02/27/2021. Pt has been advised about the importance in completing this exam.   ? ?Interpreter Needed?: No ? ?Information entered by :: Kissy Cielo, LPN ? ? ?Activities of Daily Living ? ?  07/17/2021  ? 10:39 AM 07/13/2021  ?  9:01 AM  ?In your present state of health, do you have any difficulty performing the following activities:  ?Hearing? 1 0  ?Comment mild   ?Vision? 0 0  ?Difficulty concentrating or making decisions? 0 0  ?Walking or climbing stairs? 0 0  ?Dressing or bathing? 0 0  ?Doing errands,  shopping? 0 0  ?Preparing Food and eating ? N N  ?Using the Toilet? N N  ?In the past six months, have you accidently leaked urine? N N  ?Do you have problems with loss of bowel control? N N  ?Managing your Medications?

## 2021-07-17 NOTE — Patient Instructions (Signed)
Adrienne Davidson , ?Thank you for taking time to come for your Medicare Wellness Visit. I appreciate your ongoing commitment to your health goals. Please review the following plan we discussed and let me know if I can assist you in the future.  ? ?Screening recommendations/referrals: ?Colonoscopy: Done 05/29/2017 - Repeat in 10 years ?double check with Rourk's office ?Mammogram: Done 01/18/2021 - Repeat annually ?Bone Density: Done 06/27/2021 - Repeat every 2 years ?Recommended yearly ophthalmology/optometry visit for glaucoma screening and checkup ?Recommended yearly dental visit for hygiene and checkup ? ?Vaccinations: ?Influenza vaccine: Done 02/27/2021 - Repeat annually  ?Pneumococcal vaccine: Done 12/20/2015 & 10/27/2018 ?Tdap vaccine: Done 06/27/2021 - Repeat in 10 years ?Shingles vaccine: Done 10/27/2018 & 01/01/2019   ?Covid-19:Done 11/17/2019, 06/17/2019, 03/02/2020 & 02/13/2021 ? ?Advanced directives: Please bring a copy of your health care power of attorney and living will to the office to be added to your chart at your convenience.  ? ?Conditions/risks identified: Continue yoga plus aim for 30 minutes of other exercise or brisk walking, 6-8 glasses of water, and 5 servings of fruits and vegetables each day. Try Carb Counting for your Diabetes (see notes at end of this summary). Let us know if you would like to speak with a nutritionist - Medicare will cover this. ? ?Next appointment: Follow up in one year for your annual wellness visit  ? ? ?Preventive Care 68 Years and Older, Female ?Preventive care refers to lifestyle choices and visits with your health care provider that can promote health and wellness. ?What does preventive care include? ?A yearly physical exam. This is also called an annual well check. ?Dental exams once or twice a year. ?Routine eye exams. Ask your health care provider how often you should have your eyes checked. ?Personal lifestyle choices, including: ?Daily care of your teeth and gums. ?Regular  physical activity. ?Eating a healthy diet. ?Avoiding tobacco and drug use. ?Limiting alcohol use. ?Practicing safe sex. ?Taking low-dose aspirin every day. ?Taking vitamin and mineral supplements as recommended by your health care provider. ?What happens during an annual well check? ?The services and screenings done by your health care provider during your annual well check will depend on your age, overall health, lifestyle risk factors, and family history of disease. ?Counseling  ?Your health care provider may ask you questions about your: ?Alcohol use. ?Tobacco use. ?Drug use. ?Emotional well-being. ?Home and relationship well-being. ?Sexual activity. ?Eating habits. ?History of falls. ?Memory and ability to understand (cognition). ?Work and work Statistician. ?Reproductive health. ?Screening  ?You may have the following tests or measurements: ?Height, weight, and BMI. ?Blood pressure. ?Lipid and cholesterol levels. These may be checked every 5 years, or more frequently if you are over 80 years old. ?Skin check. ?Lung cancer screening. You may have this screening every year starting at age 59 if you have a 30-pack-year history of smoking and currently smoke or have quit within the past 15 years. ?Fecal occult blood test (FOBT) of the stool. You may have this test every year starting at age 36. ?Flexible sigmoidoscopy or colonoscopy. You may have a sigmoidoscopy every 5 years or a colonoscopy every 10 years starting at age 40. ?Hepatitis C blood test. ?Hepatitis B blood test. ?Sexually transmitted disease (STD) testing. ?Diabetes screening. This is done by checking your blood sugar (glucose) after you have not eaten for a while (fasting). You may have this done every 1-3 years. ?Bone density scan. This is done to screen for osteoporosis. You may have this done starting at  age 35. ?Mammogram. This may be done every 1-2 years. Talk to your health care provider about how often you should have regular mammograms. ?Talk  with your health care provider about your test results, treatment options, and if necessary, the need for more tests. ?Vaccines  ?Your health care provider may recommend certain vaccines, such as: ?Influenza vaccine. This is recommended every year. ?Tetanus, diphtheria, and acellular pertussis (Tdap, Td) vaccine. You may need a Td booster every 10 years. ?Zoster vaccine. You may need this after age 83. ?Pneumococcal 13-valent conjugate (PCV13) vaccine. One dose is recommended after age 82. ?Pneumococcal polysaccharide (PPSV23) vaccine. One dose is recommended after age 3. ?Talk to your health care provider about which screenings and vaccines you need and how often you need them. ?This information is not intended to replace advice given to you by your health care provider. Make sure you discuss any questions you have with your health care provider. ?Document Released: 05/06/2015 Document Revised: 12/28/2015 Document Reviewed: 02/08/2015 ?Elsevier Interactive Patient Education ? 2017 [place] ? ?Fall Prevention in the Home ?Falls can cause injuries. They can happen to people of all ages. There are many things you can do to make your home safe and to help prevent falls. ?What can I do on the outside of my home? ?Regularly fix the edges of walkways and driveways and fix any cracks. ?Remove anything that might make you trip as you walk through a door, such as a raised step or threshold. ?Trim any bushes or trees on the path to your home. ?Use bright outdoor lighting. ?Clear any walking paths of anything that might make someone trip, such as rocks or tools. ?Regularly check to see if handrails are loose or broken. Make sure that both sides of any steps have handrails. ?Any raised decks and porches should have guardrails on the edges. ?Have any leaves, snow, or ice cleared regularly. ?Use sand or salt on walking paths during winter. ?Clean up any spills in your garage right away. This includes oil or grease  spills. ?What can I do in the bathroom? ?Use night lights. ?Install grab bars by the toilet and in the tub and shower. Do not use towel bars as grab bars. ?Use non-skid mats or decals in the tub or shower. ?If you need to sit down in the shower, use a plastic, non-slip stool. ?Keep the floor dry. Clean up any water that spills on the floor as soon as it happens. ?Remove soap buildup in the tub or shower regularly. ?Attach bath mats securely with double-sided non-slip rug tape. ?Do not have throw rugs and other things on the floor that can make you trip. ?What can I do in the bedroom? ?Use night lights. ?Make sure that you have a light by your bed that is easy to reach. ?Do not use any sheets or blankets that are too big for your bed. They should not hang down onto the floor. ?Have a firm chair that has side arms. You can use this for support while you get dressed. ?Do not have throw rugs and other things on the floor that can make you trip. ?What can I do in the kitchen? ?Clean up any spills right away. ?Avoid walking on wet floors. ?Keep items that you use a lot in easy-to-reach places. ?If you need to reach something above you, use a strong step stool that has a grab bar. ?Keep electrical cords out of the way. ?Do not use floor polish or wax that makes floors  slippery. If you must use wax, use non-skid floor wax. ?Do not have throw rugs and other things on the floor that can make you trip. ?What can I do with my stairs? ?Do not leave any items on the stairs. ?Make sure that there are handrails on both sides of the stairs and use them. Fix handrails that are broken or loose. Make sure that handrails are as long as the stairways. ?Check any carpeting to make sure that it is firmly attached to the stairs. Fix any carpet that is loose or worn. ?Avoid having throw rugs at the top or bottom of the stairs. If you do have throw rugs, attach them to the floor with carpet tape. ?Make sure that you have a light switch at the  top of the stairs and the bottom of the stairs. If you do not have them, ask someone to add them for you. ?What else can I do to help prevent falls? ?Wear shoes that: ?Do not have high heels. ?Have rubber bottoms.

## 2021-07-20 DIAGNOSIS — L821 Other seborrheic keratosis: Secondary | ICD-10-CM | POA: Diagnosis not present

## 2021-07-20 DIAGNOSIS — L720 Epidermal cyst: Secondary | ICD-10-CM | POA: Diagnosis not present

## 2021-07-20 DIAGNOSIS — D485 Neoplasm of uncertain behavior of skin: Secondary | ICD-10-CM | POA: Diagnosis not present

## 2021-07-20 DIAGNOSIS — C44612 Basal cell carcinoma of skin of right upper limb, including shoulder: Secondary | ICD-10-CM | POA: Diagnosis not present

## 2021-07-20 DIAGNOSIS — C44519 Basal cell carcinoma of skin of other part of trunk: Secondary | ICD-10-CM | POA: Diagnosis not present

## 2021-10-06 ENCOUNTER — Other Ambulatory Visit: Payer: Self-pay | Admitting: Family Medicine

## 2021-10-06 DIAGNOSIS — E1159 Type 2 diabetes mellitus with other circulatory complications: Secondary | ICD-10-CM

## 2021-10-30 ENCOUNTER — Ambulatory Visit (INDEPENDENT_AMBULATORY_CARE_PROVIDER_SITE_OTHER): Payer: Medicare Other

## 2021-10-30 ENCOUNTER — Ambulatory Visit (INDEPENDENT_AMBULATORY_CARE_PROVIDER_SITE_OTHER): Payer: Medicare Other | Admitting: Family Medicine

## 2021-10-30 ENCOUNTER — Encounter: Payer: Self-pay | Admitting: Family Medicine

## 2021-10-30 VITALS — BP 147/77 | HR 77 | Temp 98.3°F | Ht 63.0 in | Wt 180.0 lb

## 2021-10-30 DIAGNOSIS — R809 Proteinuria, unspecified: Secondary | ICD-10-CM | POA: Diagnosis not present

## 2021-10-30 DIAGNOSIS — E1159 Type 2 diabetes mellitus with other circulatory complications: Secondary | ICD-10-CM | POA: Diagnosis not present

## 2021-10-30 DIAGNOSIS — I152 Hypertension secondary to endocrine disorders: Secondary | ICD-10-CM | POA: Diagnosis not present

## 2021-10-30 DIAGNOSIS — F40242 Fear of bridges: Secondary | ICD-10-CM

## 2021-10-30 DIAGNOSIS — M5380 Other specified dorsopathies, site unspecified: Secondary | ICD-10-CM | POA: Diagnosis not present

## 2021-10-30 DIAGNOSIS — R319 Hematuria, unspecified: Secondary | ICD-10-CM | POA: Diagnosis not present

## 2021-10-30 DIAGNOSIS — E1129 Type 2 diabetes mellitus with other diabetic kidney complication: Secondary | ICD-10-CM

## 2021-10-30 DIAGNOSIS — F418 Other specified anxiety disorders: Secondary | ICD-10-CM | POA: Diagnosis not present

## 2021-10-30 DIAGNOSIS — Z87442 Personal history of urinary calculi: Secondary | ICD-10-CM | POA: Diagnosis not present

## 2021-10-30 DIAGNOSIS — M5136 Other intervertebral disc degeneration, lumbar region: Secondary | ICD-10-CM | POA: Diagnosis not present

## 2021-10-30 LAB — URINALYSIS, ROUTINE W REFLEX MICROSCOPIC
Bilirubin, UA: NEGATIVE
Glucose, UA: NEGATIVE
Ketones, UA: NEGATIVE
Nitrite, UA: NEGATIVE
Specific Gravity, UA: <1.005 — ABNORMAL LOW (ref 1.005–1.030)
Urobilinogen, Ur: 0.2 mg/dL (ref 0.2–1.0)
pH, UA: 5.5 (ref 5.0–7.5)

## 2021-10-30 LAB — MICROSCOPIC EXAMINATION
Epithelial Cells (non renal): NONE SEEN /hpf (ref 0–10)
Renal Epithel, UA: NONE SEEN /hpf
WBC, UA: >30 /hpf — AB (ref 0–5)

## 2021-10-30 LAB — BAYER DCA HB A1C WAIVED: HB A1C (BAYER DCA - WAIVED): 6.8 % — ABNORMAL HIGH (ref 4.8–5.6)

## 2021-10-30 MED ORDER — SERTRALINE HCL 50 MG PO TABS: 50.0000 mg | ORAL_TABLET | Freq: Every day | ORAL | 1 refills | Status: DC

## 2021-10-30 MED ORDER — SERTRALINE HCL 100 MG PO TABS: 100.0000 mg | ORAL_TABLET | Freq: Every day | ORAL | 3 refills | Status: DC

## 2021-10-30 MED ORDER — AMLODIPINE BESYLATE 5 MG PO TABS: 5.0000 mg | ORAL_TABLET | Freq: Every day | ORAL | 3 refills | Status: DC

## 2021-10-30 MED ORDER — METFORMIN HCL 1000 MG PO TABS
1000.0000 mg | ORAL_TABLET | Freq: Two times a day (BID) | ORAL | 3 refills | Status: DC
Start: 2021-10-30 — End: 2022-06-13

## 2021-10-30 NOTE — Progress Notes (Signed)
Subjective: CC:DM PCP: Adrienne Norlander, DO [name] is a 66 y.o. female presenting to clinic today for:  1. Type 2 Diabetes with hypertension, hyperlipidemia:  Compliant with all medications.  Last eye exam: UTD Last foot exam: UTD Last A1c:  Lab Results  Component Value Date   HGBA1C 7.0 (H) 06/27/2021   Nephropathy screen indicated?: needs 11/2021 Last flu, zoster and/or pneumovax:  Immunization History  Administered Date(s) Administered   Fluad Quad(high Dose 65+) 01/22/2019, 01/22/2020, 02/27/2021   Influenza Split 02/06/2017   Influenza,inj,Quad PF,6+ Mos 12/20/2017   Moderna Covid-19 Vaccine Bivalent Booster 59yr & up 02/13/2021   Moderna Sars-Covid-2 Vaccination 05/20/2019, 06/17/2019, 03/02/2020   Pneumococcal Conjugate-13 10/27/2018   Pneumococcal Polysaccharide-23 12/20/2015   Tdap 06/27/2021   Zoster Recombinat (Shingrix) 10/27/2018, 01/01/2019    ROS: NO CP, shortness of breath.  She did have 1 fall around [place].  2.  Back issue Patient reports that she has been unable to extend her back for about a month now.  She does not report any significant pain but her husband noticed that she was hunched over about a month ago.  3.  Hematuria Patient reports she has intermittent scant hematuria.  She has a history of renal stones that she has not been able to pass.  She does not report any symptomology like burning, back pain, fevers, nausea or belly pain.  4.  Anxiety Continues to have stress and anxiety related to her bridges and heights.  [place] around [place] now.  She is using Zoloft 50 mg but does not feel that it is especially helping at this time.  She has Ativan on hand but has not used any of it because she is trying to hold off on utilization of that type of medication.    ROS: Per HPI  Allergies  Allergen Reactions   Enalapril Anaphylaxis    Angioedema  Aug 2018   Past Medical History:  Diagnosis Date   Allergic rhinitis    Allergy    Anxiety    Arthritis    Diabetes mellitus (HCC)    Hyperlipidemia    Hypertension    Psoriasis    Bilateral feet   Sleep apnea    Sleep apnea    Vitamin D deficiency     Current Outpatient Medications:    amLODipine (NORVASC) 5 MG tablet, Take 1 tablet (5 mg total) by mouth daily., Disp: 90 tablet, Rfl: 0   Coenzyme Q10 100 MG capsule, Take by mouth., Disp: , Rfl:    Cranberry 250 MG CAPS, Take by mouth., Disp: , Rfl:    Dulaglutide (TRULICITY) 3 MNL/9.7QBSOPN, Inject 3 mg as directed once a week., Disp: 2 mL, Rfl: 12   EPINEPHrine 0.3 mg/0.3 mL IJ SOAJ injection, Inject 0.3 mg into the muscle as needed for anaphylaxis. Then call 911, Disp: 2 each, Rfl: 0   glucose blood test strip, Test blood sugar once daily (one touch verio if covered by ins/ otherwise whatever ins preference is) E11.65, Disp: 100 each, Rfl: 12   Krill Oil 500 MG CAPS, Take by mouth., Disp: , Rfl:    Lancets (ONETOUCH ULTRASOFT) lancets, Use once daily as instructed E11.65 (one touch verio), Disp: 100 each, Rfl: 12   LORazepam (ATIVAN) 0.5 MG tablet, Take 0.5-1 tablets (0.25-0.5 mg total) by mouth 2 (two) times daily as needed for anxiety., Disp: 10 tablet, Rfl: 0  metFORMIN (GLUCOPHAGE) 1000 MG tablet, Take 1 tablet (1,000 mg total) by mouth 2 (two) times daily with a meal., Disp: 180 tablet, Rfl: 3   Multiple Vitamins-Minerals (CENTRUM ADULTS) TABS, Take by mouth., Disp: , Rfl:    rosuvastatin (CRESTOR) 10 MG tablet, Take 1 tablet (10 mg total) by mouth daily., Disp: 90 tablet, Rfl: 3   sertraline (ZOLOFT) 50 MG tablet, Take 1 tablet (50 mg total) by mouth daily., Disp: 90 tablet, Rfl: 1 Social History   Socioeconomic History   Marital status: Married    Spouse name: Adrienne Davidson   Number of children: 2   Years of education: 12   Highest education level: Associate degree: occupational, Hotel manager, or vocational program   Occupational History   Occupation: paralegal    Comment: retired  Tobacco Use   Smoking status: Former    Packs/day: 0.25    Years: 25.00    Total pack years: 6.25    Types: Cigarettes    Quit date: 06/27/2018    Years since quitting: 3.3   Smokeless tobacco: Never  Vaping Use   Vaping Use: Never used  Substance and Sexual Activity   Alcohol use: Not Currently    Comment: wine once per year   Drug use: Not Currently   Sexual activity: Not Currently    Birth control/protection: None  Other Topics Concern   Not on file  Social History Narrative   Not on file   Social Determinants of Health   Financial Resource Strain: Low Risk  (07/17/2021)   Overall Financial Resource Strain (CARDIA)    Difficulty of Paying Living Expenses: Not hard at all  Food Insecurity: No [place] (07/17/2021)   Hunger Vital Sign    Worried About Running Out of Food in the Last Year: Never true    [place] in the Last Year: Never true  Transportation Needs: No Transportation Needs (07/17/2021)   PRAPARE - Hydrologist (Medical): No    Lack of Transportation (Non-Medical): No  Physical Activity: Sufficiently Active (07/17/2021)   Exercise Vital Sign    Days of Exercise per Week: 4 days    Minutes of Exercise per Session: 60 min  Stress: No Stress Concern Present (07/17/2021)   [place]    Feeling of Stress : Only a little  Social Connections: Moderately Integrated (07/17/2021)   Social Connection and Isolation Panel [NHANES]    Frequency of Communication with Friends and Family: Twice a week    Frequency of Social Gatherings with Friends and Family: Twice a week    Attends Religious Services: More than 4 times per year    Active Member of [employer] or Organizations: No    Attends Archivist Meetings: Patient refused    Marital Status: Married  Human resources officer Violence: Not At Risk  (07/17/2021)   Humiliation, Afraid, Rape, and Kick questionnaire    Fear of Current or Ex-Partner: No    Emotionally Abused: No    Physically Abused: No    Sexually Abused: No   Family History  Problem Relation Age of Onset   Non-Hodgkin's lymphoma Mother    Arthritis Mother    Cancer Mother    Heart disease Father    Arthritis Father    COPD Father    Hypertension Father    Diabetes Daughter    Rheum arthritis Daughter    Polycystic ovary syndrome Daughter    Breast cancer  Neg Hx     Objective: Office vital signs reviewed. BP (!) 147/77   Pulse 77   Temp 98.3 F (36.8 C)   Ht '5\' 3"'$  (1.6 m)   Wt 180 lb (81.6 kg)   SpO2 97%   BMI 31.89 kg/m   Physical Examination:  General: Awake, alert, well nourished, No acute distress HEENT: sclera white, MMM Cardio: regular rate and rhythm, S1S2 heard, no murmurs appreciated Pulm: clear to auscultation bilaterally, no wheezes, rhonchi or rales; normal work of breathing on room air Extremities: warm, well perfused, No edema, cyanosis or clubbing; +2 pulses bilaterally MSK: ambulating independently, lumbar spine with flattening.  She is unable to stand totally erect.    10/30/2021   10:49 AM 07/17/2021   10:36 AM 06/27/2021    8:26 AM  Depression screen PHQ 2/9  Decreased Interest 1 0 0  Down, Depressed, Hopeless 1 0 0  PHQ - 2 Score 2 0 0  Altered sleeping 0 0 0  Tired, decreased energy 2 0 0  Change in appetite 0 0 0  Feeling bad or failure about yourself  0 0 0  Trouble concentrating 0 1 0  Moving slowly or fidgety/restless 0 0 0  Suicidal thoughts 0 0 0  PHQ-9 Score 4 1 0  Difficult doing work/chores Not difficult at all Not difficult at all Not difficult at all      10/30/2021   10:49 AM 06/27/2021    8:27 AM 02/27/2021    8:17 AM 12/19/2020    1:42 PM  GAD 7 : Generalized Anxiety Score  Nervous, Anxious, on Edge 1 0 0 0  Control/stop worrying 0 0 0 0  Worry too much - different things 1 0 0 0  Trouble relaxing 1 0 0    Restless 0 0 0 0  Easily annoyed or irritable 1 0 0 0  Afraid - awful might happen 0 0 0 0  Total GAD 7 Score 4 0 0   Anxiety Difficulty Somewhat difficult Not difficult at all Not difficult at all Not difficult at all   Assessment/ Plan: 66 y.o. female   Type 2 diabetes mellitus with microalbuminuria, without long-term current use of insulin (HCC) - Plan: Bayer DCA Hb A1c Waived, Microalbumin / creatinine urine ratio  Hypertension associated with diabetes ([place]) - Plan: amLODipine (NORVASC) 5 MG tablet  Fear of bridges - Plan: sertraline (ZOLOFT) 100 MG tablet, DISCONTINUED: sertraline (ZOLOFT) 50 MG tablet  Situational anxiety - Plan: sertraline (ZOLOFT) 100 MG tablet, DISCONTINUED: sertraline (ZOLOFT) 50 MG tablet  Limitation of joint motion of low back - Plan: DG Lumbar Spine 2-3 Views, Ambulatory referral to Physical Therapy  Hematuria, unspecified type - Plan: Urinalysis, Routine w reflex microscopic  History of renal stone - Plan: Urinalysis, Routine w reflex microscopic  Sugar under excellent control A1c down to 6.8 today.  Check urine microalbumin.  ROI for eye exam performed.  Blood pressure better upon recheck.  Continue Norvasc 5 mg daily  We will titrate the Zoloft up to 100 mg daily in efforts to improve her symptoms.  May need to consider cognitive behavioral therapy.  Check plain films.  She has visible loss of active range of motion in extension.  Referral to physical therapy also placed  Not currently having active hematuria but we will evaluate under microscopy.  Urine sample ordered  Orders Placed This Encounter  Procedures   Bayer DCA Hb A1c Waived   No orders of the defined types were  placed in this encounter.    Adrienne Norlander, DO [facility] [phone]

## 2021-10-30 NOTE — Addendum Note (Signed)
Addended by: Janora Norlander on: 10/30/2021 03:35 PM   Modules accepted: Orders

## 2021-10-31 LAB — MICROALBUMIN / CREATININE URINE RATIO
Creatinine, Urine: 46.4 mg/dL
Microalb/Creat Ratio: 469 mg/g creat — ABNORMAL HIGH (ref 0–29)
Microalbumin, Urine: 217.5 ug/mL

## 2021-11-01 ENCOUNTER — Other Ambulatory Visit: Payer: Self-pay | Admitting: Family Medicine

## 2021-11-01 DIAGNOSIS — M5136 Other intervertebral disc degeneration, lumbar region: Secondary | ICD-10-CM

## 2021-11-01 DIAGNOSIS — M48061 Spinal stenosis, lumbar region without neurogenic claudication: Secondary | ICD-10-CM

## 2021-11-01 LAB — URINE CULTURE

## 2021-11-03 ENCOUNTER — Ambulatory Visit: Payer: Self-pay | Admitting: *Deleted

## 2021-11-03 NOTE — Chronic Care Management (AMB) (Signed)
  Chronic Care Management   Note  11/03/2021 Name: Adrienne Davidson MRN: [id number] DOB: Oct 19, 1955   Patient has either met RN Care Management goals, is stable from [facility] Management perspective, or has not recently engaged with the [facility]. I am removing RN Care Manager from Care Team and closing [place]. If patient is currently engaged with another CCM team member I will forward this encounter to inform them of my case closure. Patient may be eligible for re-engagement with RN Care Manager in the future if necessary and can discuss this with their PCP.  Chong Sicilian, BSN, RN-BC Embedded Chronic Care Manager [facility] / [facility] Direct Dial: [phone]

## 2021-11-03 NOTE — Patient Instructions (Signed)
Adrienne Davidson  I have previously worked with you through the Chronic Care Management Program at [facility]. Due to program changes I am removing myself from your care team because you've either met our goals, your conditions are stable and no longer require care management, or we haven't engaged within the past 6 months. If you are currently active with another CCM Team Member, you will remain active with them unless they reach out to you with additional information. If you feel that you need RN Care Management services in the future, please talk with your primary care provider to discuss re-engagement with the RN Care Manager that will be assigned to [facility]. This does not affect your status as a patient at [facility].   Thank you for allowing me to participate in your your healthcare journey.  Chong Sicilian, BSN, RN-BC Embedded Chronic Care Manager [facility] / [facility] Management Direct Dial: [phone]

## 2021-11-07 ENCOUNTER — Ambulatory Visit: Payer: Medicare Other | Attending: Family Medicine

## 2021-11-07 ENCOUNTER — Other Ambulatory Visit: Payer: Self-pay

## 2021-11-07 DIAGNOSIS — M5380 Other specified dorsopathies, site unspecified: Secondary | ICD-10-CM | POA: Diagnosis not present

## 2021-11-07 DIAGNOSIS — M5459 Other low back pain: Secondary | ICD-10-CM | POA: Diagnosis not present

## 2021-11-07 NOTE — Therapy (Signed)
OUTPATIENT PHYSICAL THERAPY THORACOLUMBAR EVALUATION   Patient Name: Adrienne Davidson MRN: [id number] DOB:06-03-1955, 66 y.o., female Today's Date: 11/07/2021   PT End of Session - 11/07/21 0903     Visit Number 1    Number of Visits 10    Date for PT Re-Evaluation 01/19/22    PT Start Time 0904    PT Stop Time 0943    PT Time Calculation (min) 39 min    Activity Tolerance Patient tolerated treatment well    Behavior During Therapy [facility] for tasks assessed/performed             Past Medical History:  Diagnosis Date   Allergic rhinitis    Allergy    Anxiety    Arthritis    Diabetes mellitus ([place])    Hyperlipidemia    Hypertension    Psoriasis    Bilateral feet   Sleep apnea    Sleep apnea    Vitamin D deficiency    Past Surgical History:  Procedure Laterality Date   [place]     Patient Active Problem List   Diagnosis Date Noted   Osteopenia after menopause 06/27/2021   Adverse reaction to ACE inhibitor drug 12/19/2015   Angioedema 12/19/2015   Diabetes mellitus ([place]) 12/19/2015   Hypertension associated with diabetes ([place]) 12/19/2015   OSA on CPAP 12/19/2015    PCP: Janora Norlander, DO  REFERRING PROVIDER: Janora Norlander, DO  REFERRING DIAG: Limitation of joint motion of low back  Rationale for Evaluation and Treatment Rehabilitation  THERAPY DIAG:  Other low back pain  ONSET DATE: about 25 years ago   SUBJECTIVE:                                                                                                                                                                                           SUBJECTIVE STATEMENT: Patient reports that she has been experiencing low back pain for about 25 years, but she has begun to notice recently that she cannot stand up straight. She feels that she has to bend her knees to be able to stand up straight. However, since she noticed  this she does not feel that it has gotten any worse. She notes that she had physical therapy about 25-30 years ago for her low back which helped. She has been participating in a seniors yoga class once per week and she noticed one day while they were doing a lot of standing exercises that her back was limiting her motion.  PERTINENT HISTORY:  HTN,  DM, osteopenia   PAIN:  Are you having pain? Yes: NPRS scale: 6/10 Pain location: across low back Pain description: sore Aggravating factors: standing up straight, standing (about 20 minutes), walking on hard floors (about 20 minutes) Relieving factors: sitting   PRECAUTIONS: Fall  WEIGHT BEARING RESTRICTIONS No  FALLS:  Has patient fallen in last 6 months? Yes. Number of falls 1, she notes that she fell on Easter when she was walking down steps.   LIVING ENVIRONMENT: Lives with: lives with their spouse Lives in: House/apartment Stairs: Yes: Internal: 3 steps; on right going up and External: 3 steps; can reach both Has following equipment at home: None  OCCUPATION: retired   PLOF: [place] walk upright, continue with her yoga without being limited by her back, and be able to carry her watering can for her garden   OBJECTIVE:   DIAGNOSTIC FINDINGS:  10/30/21 X-ray: IMPRESSION: 1. Advanced degenerative changes of the lumbar spine. 2. Large bilateral nephrolithiasis.  SCREENING FOR RED FLAGS: Bowel or bladder incontinence: No Spinal tumors: No Cauda equina syndrome: No Compression fracture: No Abdominal aneurysm: No  COGNITION:  Overall cognitive status: Within functional limits for tasks assessed     SENSATION: WFL  POSTURE: rounded shoulders, forward head, and flexed trunk   PALPATION: No significant tenderness to palpation  LUMBAR ROM:   Active  A/PROM  eval  Flexion 70  Extension 14 degrees of flexion; patient flexes knees to achieve neutral  Right lateral flexion Unable without flexing knees   Left lateral flexion Unable without flexing knees  Right rotation 25% limited with pain  Left rotation 50% limited with pain   (Blank rows = not tested)  LOWER EXTREMITY MMT:    MMT Right eval Left eval  Hip flexion 4/5 4+/5  Hip extension    Hip abduction    Hip adduction    Hip internal rotation    Hip external rotation    Knee flexion 4+/5 4+/5  Knee extension 4/5 4+/5  Ankle dorsiflexion 4+/5 4+/5  Ankle plantarflexion    Ankle inversion    Ankle eversion     (Blank rows = not tested)  LUMBAR SPECIAL TESTS:  Straight leg raise test: Negative and Thomas test: Positive Thomas Test: recreated familiar low back pain bilaterally with evidence of hip flexor tightness  GAIT: Assistive device utilized: None Level of assistance: Complete Independence Comments: flexed trunk, reduced gait speed, stride length    TODAY'S TREATMENT                                    11/07/21 EXERCISE LOG  Exercise Repetitions and Resistance Comments  Bridge 20 reps   Standing hip extension 20 reps each   Slouch overcorrect 15 reps  Increased discomfort           Blank cell = exercise not performed today    PATIENT EDUCATION:  Education details: HEP, POC Person educated: Patient Education method: Explanation Education comprehension: verbalized understanding   HOME EXERCISE PROGRAM: [id number]  ASSESSMENT:  CLINICAL IMPRESSION: Patient is a 66 y.o. female who was seen today for physical therapy evaluation and treatment for chronic low back pain with no known cause. She presented with moderate pain and irritability with lumbar and hip extension being the most aggravating to her familiar pain. She exhibited increased hip flexor tightness which limited her lumbar mobility. Recommend that she continue with skilled physical therapy  to address her remaining impairments to maximize her functional mobility.    OBJECTIVE IMPAIRMENTS Abnormal gait, decreased activity tolerance, decreased  mobility, difficulty walking, decreased ROM, decreased strength, impaired flexibility, postural dysfunction, and pain.   ACTIVITY LIMITATIONS lifting, standing, and locomotion level  PARTICIPATION LIMITATIONS: cleaning, shopping, community activity, and yard work  PERSONAL FACTORS Time since onset of injury/illness/exacerbation and 3+ comorbidities: HTN, DM, osteopenia  are also affecting patient's functional outcome.   REHAB POTENTIAL: Fair    CLINICAL DECISION MAKING: Evolving/moderate complexity  EVALUATION COMPLEXITY: Moderate   GOALS: Goals reviewed with patient? Yes  SHORT TERM GOALS: Target date: 11/28/2021  Patient will be independent with her HEP. Baseline: Goal status: INITIAL  2.  Patient will be able to stand upright without her familiar pain exceeding 4/10. Baseline:  Goal status: INITIAL   LONG TERM GOALS: Target date: 12/12/2021  Patient will be independent with her HEP.  Baseline:  Goal status: INITIAL  2.  Patient will be able to complete her daily activities without her familiar pain exceeding 2/10. Baseline:  Goal status: INITIAL  3.  Patient will be able to demonstrate at least 10 degrees of active lumbar extension.  Baseline:  Goal status: INITIAL  4.  Patient will be able to carry at least 8 pounds without being limited by her familiar low back pain.  Baseline:  Goal status: INITIAL   PLAN: PT FREQUENCY: 1-2x/week  PT DURATION: other: 5 weeks  PLANNED INTERVENTIONS: Therapeutic exercises, Therapeutic activity, Neuromuscular re-education, Balance training, Gait training, Patient/Family education, Self Care, Joint mobilization, Dry Needling, Electrical stimulation, Spinal mobilization, Cryotherapy, Moist heat, Taping, Traction, Ultrasound, Manual therapy, and Re-evaluation.  PLAN FOR NEXT SESSION: nustep, hip flexor stretch, hip extension, and modalities as needed   Darlin Coco, PT 11/07/2021, 1:08 PM

## 2021-11-10 ENCOUNTER — Ambulatory Visit: Payer: Medicare Other

## 2021-11-10 DIAGNOSIS — M5380 Other specified dorsopathies, site unspecified: Secondary | ICD-10-CM | POA: Diagnosis not present

## 2021-11-10 DIAGNOSIS — M5459 Other low back pain: Secondary | ICD-10-CM

## 2021-11-10 NOTE — Therapy (Signed)
OUTPATIENT PHYSICAL THERAPY THORACOLUMBAR TREATMENT   Patient Name: Adrienne Davidson MRN: [id number] DOB:April 06, 1956, 66 y.o., female Today's Date: 11/10/2021   PT End of Session - 11/10/21 0906     Visit Number 2    Number of Visits 10    Date for PT Re-Evaluation 01/19/22    PT Start Time 0900    Activity Tolerance Patient tolerated treatment well    Behavior During Therapy [facility] for tasks assessed/performed             Past Medical History:  Diagnosis Date   Allergic rhinitis    Allergy    Anxiety    Arthritis    Diabetes mellitus ([place])    Hyperlipidemia    Hypertension    Psoriasis    Bilateral feet   Sleep apnea    Sleep apnea    Vitamin D deficiency    Past Surgical History:  Procedure Laterality Date   [place]     Patient Active Problem List   Diagnosis Date Noted   Osteopenia after menopause 06/27/2021   Adverse reaction to ACE inhibitor drug 12/19/2015   Angioedema 12/19/2015   Diabetes mellitus ([place]) 12/19/2015   Hypertension associated with diabetes ([place]) 12/19/2015   OSA on CPAP 12/19/2015    PCP: Janora Norlander, DO  REFERRING PROVIDER: Janora Norlander, DO  REFERRING DIAG: Limitation of joint motion of low back  Rationale for Evaluation and Treatment Rehabilitation  THERAPY DIAG:  Other low back pain  ONSET DATE: about 25 years ago   SUBJECTIVE:                                                                                                                                                                                           SUBJECTIVE STATEMENT: Pt arrives for today's treatment session feeling good. PERTINENT HISTORY:  HTN, DM, osteopenia   PAIN:  Are you having pain? No:    PRECAUTIONS: Fall  WEIGHT BEARING RESTRICTIONS No  FALLS:  Has patient fallen in last 6 months? Yes. Number of falls 1, she notes that she fell on Easter when she was  walking down steps.   PATIENT GOALS walk upright, continue with her yoga without being limited by her back, and be able to carry her watering can for her garden   OBJECTIVE:   LUMBAR ROM:   Active  A/PROM  eval  Flexion 70  Extension 14 degrees of flexion; patient flexes knees to achieve neutral  Right lateral flexion Unable without flexing knees  Left lateral flexion  Unable without flexing knees  Right rotation 25% limited with pain  Left rotation 50% limited with pain   (Blank rows = not tested)  LOWER EXTREMITY MMT:    MMT Right eval Left eval  Hip flexion 4/5 4+/5  Hip extension    Hip abduction    Hip adduction    Hip internal rotation    Hip external rotation    Knee flexion 4+/5 4+/5  Knee extension 4/5 4+/5  Ankle dorsiflexion 4+/5 4+/5  Ankle plantarflexion    Ankle inversion    Ankle eversion     (Blank rows = not tested)  LUMBAR SPECIAL TESTS:  Straight leg raise test: Negative and Thomas test: Positive Thomas Test: recreated familiar low back pain bilaterally with evidence of hip flexor tightnes    TODAY'S TREATMENT                                    11/10/21 EXERCISE LOG  Exercise Repetitions and Resistance Comments  NuStep Lvl 3 x 15 mins   Standing hip flexion BLE, 20 reps, BUE support   Standing hip abduction BLE, 20 reps, BUE support   Standing hip extension BLE, 20 reps, BUE support   LAQ 2#, BLE, 20 reps   Seated Marches 2#, BLE, 20 reps   Ham Curls Red tband, 20 reps   Seated hip abduction Red tband, 20 reps Increased discomfort  Ball Squeezes 20 reps   Slouch Overcorrect    Bridge To fatigue   Supine hip flexor stretch    SLR BLE, 20 reps   STS 15 reps    Blank cell = exercise not performed today    PATIENT EDUCATION:  Education details: HEP, POC Person educated: Patient Education method: Explanation Education comprehension: verbalized understanding   HOME EXERCISE PROGRAM: [email or web address]  ASSESSMENT:  CLINICAL IMPRESSION:   Pt arrives for today's treatment session denying any pain.  Pt reports performing HEP at home as previously instructed.  Pt able to tolerate addition of standing hip exercises without issue or complaint.  Pt requiring min cues for proper technique and posture and to avoid compensatory leaning.  Pt also instructed in numerous seated exercises with min cues required for posture.  Pt able to perform 15 STS without use of UE support.  Standing hip exercises added to HEP, pt declined printed copy.  Pt denied any pain at completion of today's treatment session.    OBJECTIVE IMPAIRMENTS Abnormal gait, decreased activity tolerance, decreased mobility, difficulty walking, decreased ROM, decreased strength, impaired flexibility, postural dysfunction, and pain.   ACTIVITY LIMITATIONS lifting, standing, and locomotion level  PARTICIPATION LIMITATIONS: cleaning, shopping, community activity, and yard work  PERSONAL FACTORS Time since onset of injury/illness/exacerbation and 3+ comorbidities: HTN, DM, osteopenia  are also affecting patient's functional outcome.    GOALS: Goals reviewed with patient? Yes  SHORT TERM GOALS: Target date: 11/28/2021  Patient will be independent with her HEP. Baseline: Goal status: INITIAL  2.  Patient will be able to stand upright without her familiar pain exceeding 4/10. Baseline:  Goal status: INITIAL   LONG TERM GOALS: Target date: 12/12/2021  Patient will be independent with her HEP.  Baseline:  Goal status: INITIAL  2.  Patient will be able to complete her daily activities without her familiar pain exceeding 2/10. Baseline:  Goal status: INITIAL  3.  Patient will be able to demonstrate at least 10 degrees of active lumbar  extension.  Baseline:  Goal status: INITIAL  4.  Patient will be able to carry at least 8 pounds without being limited by her familiar low back pain.  Baseline:  Goal status: INITIAL   PLAN: PT FREQUENCY: 1-2x/week  PT DURATION:  other: 5 weeks  PLANNED INTERVENTIONS: Therapeutic exercises, Therapeutic activity, Neuromuscular re-education, Balance training, Gait training, Patient/Family education, Self Care, Joint mobilization, Dry Needling, Electrical stimulation, Spinal mobilization, Cryotherapy, Moist heat, Taping, Traction, Ultrasound, Manual therapy, and Re-evaluation.  PLAN FOR NEXT SESSION: nustep, hip flexor stretch, hip extension, and modalities as needed   Kathrynn Ducking, PTA 11/10/2021, 9:07 AM

## 2021-11-13 ENCOUNTER — Ambulatory Visit: Payer: Medicare Other

## 2021-11-13 DIAGNOSIS — M5459 Other low back pain: Secondary | ICD-10-CM | POA: Diagnosis not present

## 2021-11-13 DIAGNOSIS — M5380 Other specified dorsopathies, site unspecified: Secondary | ICD-10-CM | POA: Diagnosis not present

## 2021-11-13 NOTE — Therapy (Signed)
OUTPATIENT PHYSICAL THERAPY THORACOLUMBAR TREATMENT   Patient Name: Adrienne Davidson MRN: [id number] DOB:22-Oct-1955, 66 y.o., female Today's Date: 11/13/2021   PT End of Session - 11/13/21 1431     Visit Number 3    Number of Visits 10    Date for PT Re-Evaluation 01/19/22    PT Start Time 1430    Activity Tolerance Patient tolerated treatment well    Behavior During Therapy [facility] for tasks assessed/performed             Past Medical History:  Diagnosis Date   Allergic rhinitis    Allergy    Anxiety    Arthritis    Diabetes mellitus ([place])    Hyperlipidemia    Hypertension    Psoriasis    Bilateral feet   Sleep apnea    Sleep apnea    Vitamin D deficiency    Past Surgical History:  Procedure Laterality Date   [place]     Patient Active Problem List   Diagnosis Date Noted   Osteopenia after menopause 06/27/2021   Adverse reaction to ACE inhibitor drug 12/19/2015   Angioedema 12/19/2015   Diabetes mellitus ([place]) 12/19/2015   Hypertension associated with diabetes ([place]) 12/19/2015   OSA on CPAP 12/19/2015    PCP: Janora Norlander, DO  REFERRING PROVIDER: Janora Norlander, DO  REFERRING DIAG: Limitation of joint motion of low back  Rationale for Evaluation and Treatment Rehabilitation  THERAPY DIAG:  Other low back pain  ONSET DATE: about 25 years ago   SUBJECTIVE:                                                                                                                                                                                           SUBJECTIVE STATEMENT: Pt arrives for today's treatment session feeling good.  She states she was a little sore after last treatment session, but not too bad.   PERTINENT HISTORY:  HTN, DM, osteopenia   PAIN:  Are you having pain? No:    PRECAUTIONS: Fall  WEIGHT BEARING RESTRICTIONS No  FALLS:  Has patient fallen in last  6 months? Yes. Number of falls 1, she notes that she fell on Easter when she was walking down steps.   PATIENT GOALS walk upright, continue with her yoga without being limited by her back, and be able to carry her watering can for her garden   OBJECTIVE:   LUMBAR ROM:   Active  A/PROM  eval  Flexion 70  Extension 14 degrees of flexion;  patient flexes knees to achieve neutral  Right lateral flexion Unable without flexing knees  Left lateral flexion Unable without flexing knees  Right rotation 25% limited with pain  Left rotation 50% limited with pain   (Blank rows = not tested)  LOWER EXTREMITY MMT:    MMT Right eval Left eval  Hip flexion 4/5 4+/5  Hip extension    Hip abduction    Hip adduction    Hip internal rotation    Hip external rotation    Knee flexion 4+/5 4+/5  Knee extension 4/5 4+/5  Ankle dorsiflexion 4+/5 4+/5  Ankle plantarflexion    Ankle inversion    Ankle eversion     (Blank rows = not tested)  LUMBAR SPECIAL TESTS:  Straight leg raise test: Negative and Thomas test: Positive Thomas Test: recreated familiar low back pain bilaterally with evidence of hip flexor tightnes    TODAY'S TREATMENT                                    11/13/21 EXERCISE LOG  Exercise Repetitions and Resistance Comments  NuStep Lvl 3 x 15 mins   Standing hip flexion BLE, 20 reps, 2#, BUE support   Standing hip abduction BLE, 20 reps, 2#,  BUE support   Standing hip extension BLE, 20 reps, 2#, BUE support   Standing marching 2#, 20 reps, BLE, BUE support   Forward Step Ups 6 inch; 20 reps, BUE support   LAQ 3#, BLE, 20 reps   Seated Marches 3#, BLE, 20 reps   Ham Curls Green tband, 25 reps   Seated hip abduction Red tband, 2 mins Increased discomfort  Ball Squeezes 2 mins   Cybex Lumbar Extension 50# x 2 mins   Cybex Lumbar Flexion 50# x 2 mins   Slouch Overcorrect    Bridge    Supine hip flexor stretch    SLR    STS     Blank cell = exercise not performed today     PATIENT EDUCATION:  Education details: HEP, POC Person educated: Patient Education method: Explanation Education comprehension: verbalized understanding   HOME EXERCISE PROGRAM: [email or web address]  ASSESSMENT:  CLINICAL IMPRESSION:  Pt arrives for today's treatment session denying any pain.  Pt states that she felt mildly sore after last treatment session, but it did not last 24 hours.  Pt reports performing HEP at home without issue.  Pt able to tolerate increased resistance and/or reps with all exercises performed today without issue.  Pt tolerated addition of cybex lumbar extension and flexion with min cues for eccentric control.  Pt denied any pain at completion of today's treatment session.    OBJECTIVE IMPAIRMENTS Abnormal gait, decreased activity tolerance, decreased mobility, difficulty walking, decreased ROM, decreased strength, impaired flexibility, postural dysfunction, and pain.   ACTIVITY LIMITATIONS lifting, standing, and locomotion level  PARTICIPATION LIMITATIONS: cleaning, shopping, community activity, and yard work  PERSONAL FACTORS Time since onset of injury/illness/exacerbation and 3+ comorbidities: HTN, DM, osteopenia  are also affecting patient's functional outcome.    GOALS: Goals reviewed with patient? Yes  SHORT TERM GOALS: Target date: 11/28/2021  Patient will be independent with her HEP. Baseline: Goal status: INITIAL  2.  Patient will be able to stand upright without her familiar pain exceeding 4/10. Baseline:  Goal status: INITIAL   LONG TERM GOALS: Target date: 12/12/2021  Patient will be independent with her HEP.  Baseline:  Goal  status: INITIAL  2.  Patient will be able to complete her daily activities without her familiar pain exceeding 2/10. Baseline:  Goal status: INITIAL  3.  Patient will be able to demonstrate at least 10 degrees of active lumbar extension.  Baseline:  Goal status: INITIAL  4.  Patient will be able to carry at least 8  pounds without being limited by her familiar low back pain.  Baseline:  Goal status: INITIAL   PLAN: PT FREQUENCY: 1-2x/week  PT DURATION: other: 5 weeks  PLANNED INTERVENTIONS: Therapeutic exercises, Therapeutic activity, Neuromuscular re-education, Balance training, Gait training, Patient/Family education, Self Care, Joint mobilization, Dry Needling, Electrical stimulation, Spinal mobilization, Cryotherapy, Moist heat, Taping, Traction, Ultrasound, Manual therapy, and Re-evaluation.  PLAN FOR NEXT SESSION: nustep, hip flexor stretch, hip extension, and modalities as needed   Kathrynn Ducking, PTA 11/13/2021, 2:32 PM

## 2021-11-16 ENCOUNTER — Ambulatory Visit: Payer: Medicare Other | Admitting: Orthopaedic Surgery

## 2021-11-16 DIAGNOSIS — M5136 Other intervertebral disc degeneration, lumbar region: Secondary | ICD-10-CM

## 2021-11-16 NOTE — Progress Notes (Signed)
Office Visit Note   Patient: Adrienne Davidson           Date of Birth: 02-26-1956           MRN: [id number] Visit Date: 11/16/2021              Requested by: Janora Norlander, DO [place],  [place] [address] PCP: Janora Norlander, DO   Assessment & Plan: Visit Diagnoses:  1. Other intervertebral disc degeneration, lumbar region     Plan: Patient can continue therapy.  Continue yoga activities.  We discussed anterolisthesis at L4-5 and reviewed x-rays.  Recheck 8 weeks.  Follow-Up Instructions: Return in about 8 weeks (around 01/11/2022).   Orders:  No orders of the defined types were placed in this encounter.  No orders of the defined types were placed in this encounter.     Procedures: No procedures performed   Clinical Data: No additional findings.   Subjective: Chief Complaint  Patient presents with   Lower Back - Pain    Pt states she noticed she was walking bent over and went to PCP, DDD seen in x-rays. Has been going to PT and doing yoga that has helped a little.     HPI 66 year old female with history of diabetes hypertension is seen with midline low back pain x2 months.  She has been going to therapy also doing some yoga activities.  She noticed when she first gets up she is in a somewhat flexed position at the hips.  Plain radiograph showed large bilateral kidney stones.  She does not have any symptoms currently consistent with these.  X-ray showed significant anterolisthesis at L4-5 with anterior endplate abutment.  Facet arthropathy noted.  X-rays ordered by Dr.Gottschalk who referred her here for evaluation.  Review of Systems negative for chills or fever no claudication symptoms.   Objective: Vital Signs: There were no vitals taken for this visit.  Physical Exam Constitutional:      Appearance: She is well-developed.  HENT:     Head: Normocephalic.     Right Ear: External ear normal.     Left Ear: External ear normal. There is no  impacted cerumen.  Eyes:     Pupils: Pupils are equal, round, and reactive to light.  Neck:     Thyroid: No thyromegaly.     Trachea: No tracheal deviation.  Cardiovascular:     Rate and Rhythm: Normal rate.  Pulmonary:     Effort: Pulmonary effort is normal.  Abdominal:     Palpations: Abdomen is soft.  Musculoskeletal:     Cervical back: No rigidity.  Skin:    General: Skin is warm and dry.  Neurological:     Mental Status: She is alert and oriented to person, place, and time.  Psychiatric:        Behavior: Behavior normal.     Ortho Exam patient gets from sitting to standing she has some mild forward flexion when she first gets up and with ambulation.  Negative logroll the hip knee and ankle jerk are intact.  Specialty Comments:  No specialty comments available.  Imaging: Narrative & Impression  CLINICAL DATA:  Back stiffness   EXAM: LUMBAR SPINE - 2-3 VIEW   COMPARISON:  None Available.   FINDINGS: Lumbar vertebral body height are preserved without fracture. Grade 1 anterolisthesis of L3 on L4 and L4 on L5. Intervertebral disc space narrowing throughout the lumbar spine, most significant at L4-L5 and L5-S1. Facet  arthropathy in the lower lumbar spine.   Note is made of large calcific densities in the regions of the bilateral kidneys measuring up to 2 cm on the right, and 3.9 cm and 2.7 cm on the left.   Extensive calcified plaques in the abdominal aorta.   IMPRESSION: 1. Advanced degenerative changes of the lumbar spine. 2. Large bilateral nephrolithiasis.     Electronically Signed   By: Ofilia Neas M.D.   On: 10/30/2021 13:05     PMFS History: Patient Active Problem List   Diagnosis Date Noted   Other intervertebral disc degeneration, lumbar region 11/20/2021   Osteopenia after menopause 06/27/2021   Adverse reaction to ACE inhibitor drug 12/19/2015   Angioedema 12/19/2015   Diabetes mellitus ([place]) 12/19/2015   Hypertension associated  with diabetes ([place]) 12/19/2015   OSA on CPAP 12/19/2015   Past Medical History:  Diagnosis Date   Allergic rhinitis    Allergy    Anxiety    Arthritis    Diabetes mellitus ([place])    Hyperlipidemia    Hypertension    Psoriasis    Bilateral feet   Sleep apnea    Sleep apnea    Vitamin D deficiency     Family History  Problem Relation Age of Onset   Non-Hodgkin's lymphoma Mother    Arthritis Mother    Cancer Mother    Heart disease Father    Arthritis Father    COPD Father    Hypertension Father    Diabetes Daughter    Rheum arthritis Daughter    Polycystic ovary syndrome Daughter    Breast cancer Neg Hx     Past Surgical History:  Procedure Laterality Date   APPENDECTOMY     CESAREAN SECTION  1980   CESAREAN SECTION  1984   TONSILLECTOMY     Social History   Occupational History   Occupation: Radio broadcast assistant    Comment: retired  Tobacco Use   Smoking status: Former    Packs/day: 0.25    Years: 25.00    Total pack years: 6.25    Types: Cigarettes    Quit date: 06/27/2018    Years since quitting: 3.4   Smokeless tobacco: Never  Vaping Use   Vaping Use: Never used  Substance and Sexual Activity   Alcohol use: Not Currently    Comment: wine once per year   Drug use: Not Currently   Sexual activity: Not Currently    Birth control/protection: None

## 2021-11-17 ENCOUNTER — Encounter: Payer: Self-pay | Admitting: Physical Therapy

## 2021-11-17 ENCOUNTER — Ambulatory Visit: Payer: Medicare Other | Admitting: Physical Therapy

## 2021-11-17 DIAGNOSIS — M5459 Other low back pain: Secondary | ICD-10-CM

## 2021-11-17 DIAGNOSIS — M5380 Other specified dorsopathies, site unspecified: Secondary | ICD-10-CM | POA: Diagnosis not present

## 2021-11-17 NOTE — Therapy (Signed)
OUTPATIENT PHYSICAL THERAPY THORACOLUMBAR TREATMENT   Patient Name: Adrienne Davidson MRN: [id number] DOB:1955-11-19, 66 y.o., female Today's Date: 11/17/2021   PT End of Session - 11/17/21 0904     Visit Number 4    Number of Visits 10    Date for PT Re-Evaluation 01/19/22    PT Start Time 0901    PT Stop Time 0945    PT Time Calculation (min) 44 min    Activity Tolerance Patient tolerated treatment well    Behavior During Therapy WFL for tasks assessed/performed             Past Medical History:  Diagnosis Date   Allergic rhinitis    Allergy    Anxiety    Arthritis    Diabetes mellitus ([place])    Hyperlipidemia    Hypertension    Psoriasis    Bilateral feet   Sleep apnea    Sleep apnea    Vitamin D deficiency    Past Surgical History:  Procedure Laterality Date   [place]     Patient Active Problem List   Diagnosis Date Noted   Osteopenia after menopause 06/27/2021   Adverse reaction to ACE inhibitor drug 12/19/2015   Angioedema 12/19/2015   Diabetes mellitus ([place]) 12/19/2015   Hypertension associated with diabetes ([place]) 12/19/2015   OSA on CPAP 12/19/2015    PCP: Janora Norlander, DO  REFERRING PROVIDER: Janora Norlander, DO  REFERRING DIAG: Limitation of joint motion of low back  Rationale for Evaluation and Treatment Rehabilitation  THERAPY DIAG:  Other low back pain  ONSET DATE: about 25 years ago   SUBJECTIVE:                                                                                                                                                                                           SUBJECTIVE STATEMENT: Reports that she hasn't done much this morning so she doesn't ave much pain.  PERTINENT HISTORY:  HTN, DM, osteopenia   PAIN:  Are you having pain? No:    PRECAUTIONS: Fall  WEIGHT BEARING RESTRICTIONS No  FALLS:  Has patient fallen in last 6  months? Yes. Number of falls 1, she notes that she fell on Easter when she was walking down steps.    OBJECTIVE:   TODAY'S TREATMENT                                    11/13/21 EXERCISE  LOG  Exercise Repetitions and Resistance Comments  NuStep Lvl 3 x 15 mins   Standing hip flexion BLE, 20 reps, 2#, BUE support   Standing hip abduction BLE, 20 reps, 2#,  BUE support   Standing hip extension BLE, 20 reps, 2#, BUE support   LAQ 4#, BLE, 30 reps   Seated Marches Yellow x20 reps   Cybex Lumbar Extension 50# x 2 mins   Cybex Lumbar Flexion 60# x 2 mins   Seated clam Yellow x20 reps    Blank cell = exercise not performed today    PATIENT EDUCATION:  Education details: HEP, POC Person educated: Patient Education method: Explanation Education comprehension: verbalized understanding   HOME EXERCISE PROGRAM: [id number]  ASSESSMENT:  CLINICAL IMPRESSION: Patient presented in clinic with no LBP. Patient seen her orthopedist and he states that strengthening will be good as he thinks she will eventually need surgery. Patient progressed through core and LE strengthening with VCs for core activation and posture. No complaints of pain during or after treatment.   OBJECTIVE IMPAIRMENTS Abnormal gait, decreased activity tolerance, decreased mobility, difficulty walking, decreased ROM, decreased strength, impaired flexibility, postural dysfunction, and pain.   ACTIVITY LIMITATIONS lifting, standing, and locomotion level  PARTICIPATION LIMITATIONS: cleaning, shopping, community activity, and yard work  PERSONAL FACTORS Time since onset of injury/illness/exacerbation and 3+ comorbidities: HTN, DM, osteopenia  are also affecting patient's functional outcome.    GOALS: Goals reviewed with patient? Yes  SHORT TERM GOALS: Target date: 11/28/2021  Patient will be independent with her HEP. Baseline: Goal status: INITIAL  2.  Patient will be able to stand upright without her familiar pain  exceeding 4/10. Baseline:  Goal status: INITIAL   LONG TERM GOALS: Target date: 12/12/2021  Patient will be independent with her HEP.  Baseline:  Goal status: INITIAL  2.  Patient will be able to complete her daily activities without her familiar pain exceeding 2/10. Baseline:  Goal status: INITIAL  3.  Patient will be able to demonstrate at least 10 degrees of active lumbar extension.  Baseline:  Goal status: INITIAL  4.  Patient will be able to carry at least 8 pounds without being limited by her familiar low back pain.  Baseline:  Goal status: INITIAL   PLAN: PT FREQUENCY: 1-2x/week  PT DURATION: other: 5 weeks  PLANNED INTERVENTIONS: Therapeutic exercises, Therapeutic activity, Neuromuscular re-education, Balance training, Gait training, Patient/Family education, Self Care, Joint mobilization, Dry Needling, Electrical stimulation, Spinal mobilization, Cryotherapy, Moist heat, Taping, Traction, Ultrasound, Manual therapy, and Re-evaluation.  PLAN FOR NEXT SESSION: nustep, hip flexor stretch, hip extension, and modalities as needed   Standley Brooking, PTA 11/17/2021, 10:02 AM

## 2021-11-20 ENCOUNTER — Ambulatory Visit: Payer: Medicare Other | Admitting: Physical Therapy

## 2021-11-20 ENCOUNTER — Encounter: Payer: Self-pay | Admitting: Physical Therapy

## 2021-11-20 DIAGNOSIS — M5459 Other low back pain: Secondary | ICD-10-CM

## 2021-11-20 DIAGNOSIS — M5136 Other intervertebral disc degeneration, lumbar region: Secondary | ICD-10-CM | POA: Insufficient documentation

## 2021-11-20 DIAGNOSIS — M5380 Other specified dorsopathies, site unspecified: Secondary | ICD-10-CM | POA: Diagnosis not present

## 2021-11-20 NOTE — Therapy (Signed)
OUTPATIENT PHYSICAL THERAPY THORACOLUMBAR TREATMENT   Patient Name: Adrienne Davidson MRN: [id number] DOB:January 16, 1956, 66 y.o., female Today's Date: 11/20/2021   PT End of Session - 11/20/21 1316     Visit Number 5    Number of Visits 10    Date for PT Re-Evaluation 01/19/22    PT Start Time 1301    PT Stop Time 1345    PT Time Calculation (min) 44 min    Activity Tolerance Patient tolerated treatment well    Behavior During Therapy WFL for tasks assessed/performed             Past Medical History:  Diagnosis Date   Allergic rhinitis    Allergy    Anxiety    Arthritis    Diabetes mellitus ([place])    Hyperlipidemia    Hypertension    Psoriasis    Bilateral feet   Sleep apnea    Sleep apnea    Vitamin D deficiency    Past Surgical History:  Procedure Laterality Date   [place]     Patient Active Problem List   Diagnosis Date Noted   Other intervertebral disc degeneration, lumbar region 11/20/2021   Osteopenia after menopause 06/27/2021   Adverse reaction to ACE inhibitor drug 12/19/2015   Angioedema 12/19/2015   Diabetes mellitus ([place]) 12/19/2015   Hypertension associated with diabetes ([place]) 12/19/2015   OSA on CPAP 12/19/2015    PCP: Janora Norlander, DO  REFERRING PROVIDER: Janora Norlander, DO  REFERRING DIAG: Limitation of joint motion of low back  Rationale for Evaluation and Treatment Rehabilitation  THERAPY DIAG:  Other low back pain  ONSET DATE: about 25 years ago   SUBJECTIVE:                                                                                                                                                                                           SUBJECTIVE STATEMENT: Reports some has some LBP but admits to not doing HEP tomorrow.  PERTINENT HISTORY:  HTN, DM, osteopenia   PAIN:  Are you having pain? Yes but no pain score  provided   PRECAUTIONS: Fall  WEIGHT BEARING RESTRICTIONS No  FALLS:  Has patient fallen in last 6 months? Yes. Number of falls 1, she notes that she fell on Easter when she was walking down steps.    OBJECTIVE:   TODAY'S TREATMENT  11/20/21 EXERCISE LOG  Exercise Repetitions and Resistance Comments  NuStep Lvl 3 x 15 mins   Standing hip flexion BLE, 20 reps, 2#, BUE support   Standing hip abduction BLE, 20 reps, 2#,  BUE support   Standing hip extension BLE, 20 reps, 2#, BUE support   LAQ 4#, BLE, 30 reps   Cybex Lumbar Extension 50# x 2 mins   Cybex Lumbar Flexion 60# x 2 mins   Seated clam Yellow x20 reps   Shoulder extension Blue XTS x20 reps With posture and core activation   Blank cell = exercise not performed today    PATIENT EDUCATION:  Education details: HEP, POC Person educated: Patient Education method: Explanation Education comprehension: verbalized understanding   HOME EXERCISE PROGRAM: [id number]  ASSESSMENT:  CLINICAL IMPRESSION: Patient reporting only minimal LBP but admits to not doing HEP yesterday. Patient also more aware of posture with ADLs per patient report. Patient able to complete all therex today with only minimal VCs and good technique throughout. No complaints of new or increased LBP with therex.   OBJECTIVE IMPAIRMENTS Abnormal gait, decreased activity tolerance, decreased mobility, difficulty walking, decreased ROM, decreased strength, impaired flexibility, postural dysfunction, and pain.   ACTIVITY LIMITATIONS lifting, standing, and locomotion level  PARTICIPATION LIMITATIONS: cleaning, shopping, community activity, and yard work  PERSONAL FACTORS Time since onset of injury/illness/exacerbation and 3+ comorbidities: HTN, DM, osteopenia  are also affecting patient's functional outcome.    GOALS: Goals reviewed with patient? Yes  SHORT TERM GOALS: Target date: 11/28/2021  Patient will be independent  with her HEP. Baseline: Goal status: INITIAL  2.  Patient will be able to stand upright without her familiar pain exceeding 4/10. Baseline:  Goal status: INITIAL   LONG TERM GOALS: Target date: 12/12/2021  Patient will be independent with her HEP.  Baseline:  Goal status: INITIAL  2.  Patient will be able to complete her daily activities without her familiar pain exceeding 2/10. Baseline:  Goal status: INITIAL  3.  Patient will be able to demonstrate at least 10 degrees of active lumbar extension.  Baseline:  Goal status: INITIAL  4.  Patient will be able to carry at least 8 pounds without being limited by her familiar low back pain.  Baseline:  Goal status: INITIAL   PLAN: PT FREQUENCY: 1-2x/week  PT DURATION: other: 5 weeks  PLANNED INTERVENTIONS: Therapeutic exercises, Therapeutic activity, Neuromuscular re-education, Balance training, Gait training, Patient/Family education, Self Care, Joint mobilization, Dry Needling, Electrical stimulation, Spinal mobilization, Cryotherapy, Moist heat, Taping, Traction, Ultrasound, Manual therapy, and Re-evaluation.  PLAN FOR NEXT SESSION: nustep, hip flexor stretch, hip extension, and modalities as needed   Standley Brooking, PTA 11/20/2021, 2:47 PM

## 2021-11-24 ENCOUNTER — Ambulatory Visit: Payer: Medicare Other | Attending: Family Medicine | Admitting: Physical Therapy

## 2021-11-24 ENCOUNTER — Encounter: Payer: Self-pay | Admitting: Physical Therapy

## 2021-11-24 DIAGNOSIS — M5459 Other low back pain: Secondary | ICD-10-CM | POA: Diagnosis not present

## 2021-11-24 NOTE — Therapy (Signed)
OUTPATIENT PHYSICAL THERAPY THORACOLUMBAR TREATMENT   Patient Name: Adrienne Davidson MRN: [id number] DOB:03-12-1956, 66 y.o., female Today's Date: 11/24/2021   PT End of Session - 11/24/21 0916     Visit Number 6    Number of Visits 10    Date for PT Re-Evaluation 01/19/22    PT Start Time 0903    PT Stop Time 0945    PT Time Calculation (min) 42 min    Activity Tolerance Patient tolerated treatment well    Behavior During Therapy WFL for tasks assessed/performed             Past Medical History:  Diagnosis Date   Allergic rhinitis    Allergy    Anxiety    Arthritis    Diabetes mellitus ([place])    Hyperlipidemia    Hypertension    Psoriasis    Bilateral feet   Sleep apnea    Sleep apnea    Vitamin D deficiency    Past Surgical History:  Procedure Laterality Date   [place]     Patient Active Problem List   Diagnosis Date Noted   Other intervertebral disc degeneration, lumbar region 11/20/2021   Osteopenia after menopause 06/27/2021   Adverse reaction to ACE inhibitor drug 12/19/2015   Angioedema 12/19/2015   Diabetes mellitus ([place]) 12/19/2015   Hypertension associated with diabetes ([place]) 12/19/2015   OSA on CPAP 12/19/2015    PCP: Janora Norlander, DO  REFERRING PROVIDER: Janora Norlander, DO  REFERRING DIAG: Limitation of joint motion of low back  Rationale for Evaluation and Treatment Rehabilitation  THERAPY DIAG:  Other low back pain  ONSET DATE: about 25 years ago   SUBJECTIVE:                                                                                                                                                                                           SUBJECTIVE STATEMENT: Mornings are worse and more stiff.  PERTINENT HISTORY:  HTN, DM, osteopenia   PAIN:  Are you having pain? Yes but no pain score provided   PRECAUTIONS: Fall  WEIGHT BEARING  RESTRICTIONS No  FALLS:  Has patient fallen in last 6 months? Yes. Number of falls 1, she notes that she fell on Easter when she was walking down steps.    OBJECTIVE:   TODAY'S TREATMENT  11/24/21 EXERCISE LOG  Exercise Repetitions and Resistance Comments  NuStep Lvl 3 x 15 mins   Standing hip flexion BLE, 20 reps, 2#, BUE support   Standing hip abduction BLE, 20 reps, 2#,  BUE support   Standing hip extension BLE, 20 reps, 2#, BUE support   3D lumbar stretching at table X15 reps   Knee extensors 10# x30 reps   Knee flexors 30# x30 reps   Shoulder extension Blue XTS x20 reps With posture and core activation   Blank cell = exercise not performed today    PATIENT EDUCATION:  Education details: HEP, POC Person educated: Patient Education method: Explanation Education comprehension: verbalized understanding   HOME EXERCISE PROGRAM: [id number]  ASSESSMENT:  CLINICAL IMPRESSION: Patient presented in clinic with more tightness in low back as she states that mornings are generally worse for stiffness. Patient guided through more stretching for lumbar as well as LE strengthening with VCs to maintain erect posture. Patient educated regarding importance of LE strength in regards to new movement pattern to avoid excessive LBP. No complaints following end of treatment.   OBJECTIVE IMPAIRMENTS Abnormal gait, decreased activity tolerance, decreased mobility, difficulty walking, decreased ROM, decreased strength, impaired flexibility, postural dysfunction, and pain.   ACTIVITY LIMITATIONS lifting, standing, and locomotion level  PARTICIPATION LIMITATIONS: cleaning, shopping, community activity, and yard work  PERSONAL FACTORS Time since onset of injury/illness/exacerbation and 3+ comorbidities: HTN, DM, osteopenia  are also affecting patient's functional outcome.    GOALS: Goals reviewed with patient? Yes  SHORT TERM GOALS: Target date:  11/28/2021  Patient will be independent with her HEP. Baseline: Goal status: INITIAL  2.  Patient will be able to stand upright without her familiar pain exceeding 4/10. Baseline:  Goal status: INITIAL   LONG TERM GOALS: Target date: 12/12/2021  Patient will be independent with her HEP.  Baseline:  Goal status: INITIAL  2.  Patient will be able to complete her daily activities without her familiar pain exceeding 2/10. Baseline:  Goal status: INITIAL  3.  Patient will be able to demonstrate at least 10 degrees of active lumbar extension.  Baseline:  Goal status: INITIAL  4.  Patient will be able to carry at least 8 pounds without being limited by her familiar low back pain.  Baseline:  Goal status: INITIAL   PLAN: PT FREQUENCY: 1-2x/week  PT DURATION: other: 5 weeks  PLANNED INTERVENTIONS: Therapeutic exercises, Therapeutic activity, Neuromuscular re-education, Balance training, Gait training, Patient/Family education, Self Care, Joint mobilization, Dry Needling, Electrical stimulation, Spinal mobilization, Cryotherapy, Moist heat, Taping, Traction, Ultrasound, Manual therapy, and Re-evaluation.  PLAN FOR NEXT SESSION: nustep, hip flexor stretch, hip extension, and modalities as needed   Standley Brooking, PTA 11/24/2021, 12:32 PM

## 2021-11-27 ENCOUNTER — Encounter: Payer: Medicare Other | Admitting: Physical Therapy

## 2021-11-29 ENCOUNTER — Encounter: Payer: Self-pay | Admitting: Physical Therapy

## 2021-11-29 ENCOUNTER — Ambulatory Visit: Payer: Medicare Other | Admitting: Physical Therapy

## 2021-11-29 DIAGNOSIS — M5459 Other low back pain: Secondary | ICD-10-CM | POA: Diagnosis not present

## 2021-11-29 NOTE — Therapy (Addendum)
OUTPATIENT PHYSICAL THERAPY THORACOLUMBAR TREATMENT   Patient Name: Adrienne Davidson MRN: [id number] DOB:1956-01-02, 66 y.o., female Today's Date: 11/29/2021   PT End of Session - 11/29/21 0904     Visit Number 7    Number of Visits 10    Date for PT Re-Evaluation 01/19/22    PT Start Time 0901    PT Stop Time 0945    PT Time Calculation (min) 44 min    Activity Tolerance Patient tolerated treatment well    Behavior During Therapy WFL for tasks assessed/performed             Past Medical History:  Diagnosis Date   Allergic rhinitis    Allergy    Anxiety    Arthritis    Diabetes mellitus ([place])    Hyperlipidemia    Hypertension    Psoriasis    Bilateral feet   Sleep apnea    Sleep apnea    Vitamin D deficiency    Past Surgical History:  Procedure Laterality Date   [place]     Patient Active Problem List   Diagnosis Date Noted   Other intervertebral disc degeneration, lumbar region 11/20/2021   Osteopenia after menopause 06/27/2021   Adverse reaction to ACE inhibitor drug 12/19/2015   Angioedema 12/19/2015   Diabetes mellitus ([place]) 12/19/2015   Hypertension associated with diabetes ([place]) 12/19/2015   OSA on CPAP 12/19/2015    PCP: Janora Norlander, DO  REFERRING PROVIDER: Janora Norlander, DO  REFERRING DIAG: Limitation of joint motion of low back  Rationale for Evaluation and Treatment Rehabilitation  THERAPY DIAG:  No diagnosis found.  ONSET DATE: about 25 years ago   SUBJECTIVE:                                                                                                                                                                                           SUBJECTIVE STATEMENT: Reports that he has felt the best in the last couple days. Mornings are still slow.  PERTINENT HISTORY:  HTN, DM, osteopenia   PAIN:  Are you having pain? No   PRECAUTIONS:  Fall  WEIGHT BEARING RESTRICTIONS No  FALLS:  Has patient fallen in last 6 months? Yes. Number of falls 1, she notes that she fell on Easter when she was walking down steps.    OBJECTIVE:   TODAY'S TREATMENT  11/29/21 EXERCISE LOG  Exercise Repetitions and Resistance Comments  NuStep Lvl 3 x 15 mins   Standing hip extension BLE, 20 reps, yellow theraband, BUE support   Lumbar extension 60# x30 reps   Lumbar flexion 60# x30 reps   Knee extensors 10# x30 reps   Knee flexors 30# x30 reps   Shoulder extension Blue XTS x20 reps With posture and core activation  Sit <> stand X10 reps Proper posture VC'd   Blank cell = exercise not performed today    PATIENT EDUCATION:  Education details: HEP, POC Person educated: Patient Education method: Explanation Education comprehension: verbalized understanding   HOME EXERCISE PROGRAM: [id number]  ASSESSMENT:  CLINICAL IMPRESSION: Patient arrived with no pain but feeling better in the last few days. Patient able to progress through therex with resistance and postural training throughout the session. Patient denied any pain following therex session.   OBJECTIVE IMPAIRMENTS Abnormal gait, decreased activity tolerance, decreased mobility, difficulty walking, decreased ROM, decreased strength, impaired flexibility, postural dysfunction, and pain.   ACTIVITY LIMITATIONS lifting, standing, and locomotion level  PARTICIPATION LIMITATIONS: cleaning, shopping, community activity, and yard work  PERSONAL FACTORS Time since onset of injury/illness/exacerbation and 3+ comorbidities: HTN, DM, osteopenia  are also affecting patient's functional outcome.    GOALS: Goals reviewed with patient? Yes  SHORT TERM GOALS: Target date: 11/28/2021  Patient will be independent with her HEP. Baseline: Goal status: INITIAL  2.  Patient will be able to stand upright without her familiar pain exceeding 4/10. Baseline:  Goal  status: INITIAL   LONG TERM GOALS: Target date: 12/12/2021  Patient will be independent with her HEP.  Baseline:  Goal status: INITIAL  2.  Patient will be able to complete her daily activities without her familiar pain exceeding 2/10. Baseline:  Goal status: INITIAL  3.  Patient will be able to demonstrate at least 10 degrees of active lumbar extension.  Baseline:  Goal status: INITIAL  4.  Patient will be able to carry at least 8 pounds without being limited by her familiar low back pain.  Baseline:  Goal status: INITIAL   PLAN: PT FREQUENCY: 1-2x/week  PT DURATION: other: 5 weeks  PLANNED INTERVENTIONS: Therapeutic exercises, Therapeutic activity, Neuromuscular re-education, Balance training, Gait training, Patient/Family education, Self Care, Joint mobilization, Dry Needling, Electrical stimulation, Spinal mobilization, Cryotherapy, Moist heat, Taping, Traction, Ultrasound, Manual therapy, and Re-evaluation.  PLAN FOR NEXT SESSION: nustep, hip flexor stretch, hip extension, and modalities as needed   Standley Brooking, PTA 11/29/2021, 12:29 PM

## 2021-12-01 ENCOUNTER — Ambulatory Visit: Payer: Medicare Other | Admitting: Physical Therapy

## 2021-12-01 ENCOUNTER — Encounter: Payer: Self-pay | Admitting: Physical Therapy

## 2021-12-01 DIAGNOSIS — M5459 Other low back pain: Secondary | ICD-10-CM

## 2021-12-01 NOTE — Therapy (Signed)
OUTPATIENT PHYSICAL THERAPY THORACOLUMBAR TREATMENT   Patient Name: Adrienne Davidson MRN: [id number] DOB:[birth year], 66 y.o., female Today's Date: 12/01/2021   PT End of Session - 12/01/21 0902     Visit Number 8    Number of Visits 10    Date for PT Re-Evaluation 01/19/22    PT Start Time 0901    PT Stop Time 0944    PT Time Calculation (min) 43 min    Activity Tolerance Patient tolerated treatment well    Behavior During Therapy WFL for tasks assessed/performed             Past Medical History:  Diagnosis Date   Allergic rhinitis    Allergy    Anxiety    Arthritis    Diabetes mellitus ([place])    Hyperlipidemia    Hypertension    Psoriasis    Bilateral feet   Sleep apnea    Sleep apnea    Vitamin D deficiency    Past Surgical History:  Procedure Laterality Date   [place]     Patient Active Problem List   Diagnosis Date Noted   Other intervertebral disc degeneration, lumbar region 11/20/2021   Osteopenia after menopause 06/27/2021   Adverse reaction to ACE inhibitor drug 12/19/2015   Angioedema 12/19/2015   Diabetes mellitus ([place]) 12/19/2015   Hypertension associated with diabetes ([place]) 12/19/2015   OSA on CPAP 12/19/2015    PCP: Janora Norlander, DO  REFERRING PROVIDER: Janora Norlander, DO  REFERRING DIAG: Limitation of joint motion of low back  Rationale for Evaluation and Treatment Rehabilitation  THERAPY DIAG:  Other low back pain  ONSET DATE: about 25 years ago   SUBJECTIVE:                                                                                                                                                                                           SUBJECTIVE STATEMENT: Has had twinges of pain this morning but not really hurting.  PERTINENT HISTORY:  HTN, DM, osteopenia   PAIN:  Are you having pain? No   PRECAUTIONS: Fall  WEIGHT BEARING  RESTRICTIONS No  FALLS:  Has patient fallen in last 6 months? Yes. Number of falls 1, she notes that she fell on Easter when she was walking down steps.    OBJECTIVE:   TODAY'S TREATMENT  11/29/21 EXERCISE LOG  Exercise Repetitions and Resistance Comments  NuStep Lvl 3 x 15 mins   Standing hip extension BLE, 20 reps, yellow theraband, BUE support   Lumbar extension 60# x30 reps   Lumbar flexion 60# x30 reps   Knee extensors 10# x30 reps   Knee flexors 30# x30 reps   Shoulder extension Blue XTS x20 reps With posture and core activation  Mod. Cat/camel UEs, LEs, opp arm and leg x10 reps   Leg press 1 pl, seat 8 x20 reps with core activation    Blank cell = exercise not performed today    PATIENT EDUCATION:  Education details: HEP, POC Person educated: Patient Education method: Explanation Education comprehension: verbalized understanding   HOME EXERCISE PROGRAM: [id number]  ASSESSMENT:  CLINICAL IMPRESSION: Patient arrived with no pain but only reporting twinges of pain this morning. Patient progressed through strengthening for lumbar and LEs with only reports of fatigue. Seeing some improvement in regards to pain and posture but long duration activities such as grocery shopping still causes pain. No complaints of pain following therex.    OBJECTIVE IMPAIRMENTS Abnormal gait, decreased activity tolerance, decreased mobility, difficulty walking, decreased ROM, decreased strength, impaired flexibility, postural dysfunction, and pain.   ACTIVITY LIMITATIONS lifting, standing, and locomotion level  PARTICIPATION LIMITATIONS: cleaning, shopping, community activity, and yard work  PERSONAL FACTORS Time since onset of injury/illness/exacerbation and 3+ comorbidities: HTN, DM, osteopenia  are also affecting patient's functional outcome.    GOALS: Goals reviewed with patient? Yes  SHORT TERM GOALS: Target date: 11/28/2021  Patient will be  independent with her HEP. Baseline: Goal status: Achieved  2.  Patient will be able to stand upright without her familiar pain exceeding 4/10. Baseline:  Goal status: Achieved   LONG TERM GOALS: Target date: 12/12/2021  Patient will be independent with her HEP.  Baseline:  Goal status: On-going  2.  Patient will be able to complete her daily activities without her familiar pain exceeding 2/10. Baseline:  Goal status: Partially achieved   3.  Patient will be able to demonstrate at least 10 degrees of active lumbar extension.  Baseline:  Goal status: On-going  4.  Patient will be able to carry at least 8 pounds without being limited by her familiar low back pain.  Baseline:  Goal status: On-going   PLAN: PT FREQUENCY: 1-2x/week  PT DURATION: other: 5 weeks  PLANNED INTERVENTIONS: Therapeutic exercises, Therapeutic activity, Neuromuscular re-education, Balance training, Gait training, Patient/Family education, Self Care, Joint mobilization, Dry Needling, Electrical stimulation, Spinal mobilization, Cryotherapy, Moist heat, Taping, Traction, Ultrasound, Manual therapy, and Re-evaluation.  PLAN FOR NEXT SESSION: nustep, hip flexor stretch, hip extension, and modalities as needed   Standley Brooking, PTA 12/01/2021, 10:19 AM

## 2021-12-05 ENCOUNTER — Ambulatory Visit: Payer: Medicare Other

## 2021-12-05 DIAGNOSIS — M5459 Other low back pain: Secondary | ICD-10-CM | POA: Diagnosis not present

## 2021-12-05 NOTE — Therapy (Signed)
OUTPATIENT PHYSICAL THERAPY THORACOLUMBAR TREATMENT   Patient Name: Adrienne Davidson MRN: [id number] DOB:December 23, 1955, 66 y.o., female Today's Date: 12/05/2021   PT End of Session - 12/05/21 0948     Visit Number 9    Number of Visits 10    Date for PT Re-Evaluation 01/19/22    PT Start Time 0945    PT Stop Time 1030    PT Time Calculation (min) 45 min    Activity Tolerance Patient tolerated treatment well    Behavior During Therapy WFL for tasks assessed/performed             Past Medical History:  Diagnosis Date   Allergic rhinitis    Allergy    Anxiety    Arthritis    Diabetes mellitus ([place])    Hyperlipidemia    Hypertension    Psoriasis    Bilateral feet   Sleep apnea    Sleep apnea    Vitamin D deficiency    Past Surgical History:  Procedure Laterality Date   [place]     Patient Active Problem List   Diagnosis Date Noted   Other intervertebral disc degeneration, lumbar region 11/20/2021   Osteopenia after menopause 06/27/2021   Adverse reaction to ACE inhibitor drug 12/19/2015   Angioedema 12/19/2015   Diabetes mellitus ([place]) 12/19/2015   Hypertension associated with diabetes ([place]) 12/19/2015   OSA on CPAP 12/19/2015    PCP: Janora Norlander, DO  REFERRING PROVIDER: Janora Norlander, DO  REFERRING DIAG: Limitation of joint motion of low back  Rationale for Evaluation and Treatment Rehabilitation  THERAPY DIAG:  Other low back pain  ONSET DATE: about 25 years ago   SUBJECTIVE:                                                                                                                                                                                           SUBJECTIVE STATEMENT: Pt denies any issues or pain today.   PERTINENT HISTORY:  HTN, DM, osteopenia   PAIN:  Are you having pain? No   PRECAUTIONS: Fall  WEIGHT BEARING RESTRICTIONS No  FALLS:   Has patient fallen in last 6 months? Yes. Number of falls 1, she notes that she fell on Easter when she was walking down steps.    OBJECTIVE:   TODAY'S TREATMENT  12/05/21 EXERCISE LOG  Exercise Repetitions and Resistance Comments  NuStep Lvl 4 x 15 mins   Standing hip extension BLE, 20 reps, red tband, BUE support   Standing hip abduction BLE, 20 reps, red tband, BUE support   Lumbar extension 70# x30 reps   Lumbar flexion 70# x30 reps   Knee extensors 20# x25 reps   Knee flexors 40# x25 reps   Shoulder extension Blue XTS x25 reps With posture and core activation  Chopping wood Bil, Blue XTS x 20 reps each   Mod. Cat/camel    Leg press 2 pl, seat 7 x 25 reps with core activation    Blank cell = exercise not performed today    PATIENT EDUCATION:  Education details: HEP, POC Person educated: Patient Education method: Explanation Education comprehension: verbalized understanding   HOME EXERCISE PROGRAM: [id number]  ASSESSMENT:  CLINICAL IMPRESSION: Pt arrives for today's treatment session denying any pain.  Pt able to increase weight will all cybex exercises today.  Pt requiring min cues for eccentric control with cybex exercises.  Pt expressed interest in attending local [employer] after discharge at next session. Pt able to demonstrate 12 degrees of lumbar extension today.  Pt denied any pain at the completion of today's treatment session.    OBJECTIVE IMPAIRMENTS Abnormal gait, decreased activity tolerance, decreased mobility, difficulty walking, decreased ROM, decreased strength, impaired flexibility, postural dysfunction, and pain.   ACTIVITY LIMITATIONS lifting, standing, and locomotion level  PARTICIPATION LIMITATIONS: cleaning, shopping, community activity, and yard work  PERSONAL FACTORS Time since onset of injury/illness/exacerbation and 3+ comorbidities: HTN, DM, osteopenia  are also affecting patient's functional outcome.     GOALS: Goals reviewed with patient? Yes  SHORT TERM GOALS: Target date: 11/28/2021  Patient will be independent with her HEP. Baseline: Goal status: Achieved  2.  Patient will be able to stand upright without her familiar pain exceeding 4/10. Baseline:  Goal status: Achieved   LONG TERM GOALS: Target date: 12/12/2021  Patient will be independent with her HEP.  Baseline:  Goal status: Achieved  2.  Patient will be able to complete her daily activities without her familiar pain exceeding 2/10. Baseline:  Goal status: Partially achieved   3.  Patient will be able to demonstrate at least 10 degrees of active lumbar extension.  Baseline: 12/05/21: 12 degrees of lumbar extension Goal status: Achieved  4.  Patient will be able to carry at least 8 pounds without being limited by her familiar low back pain.  Baseline: 12/05/21: Able to carry 8#  Goal status: Achieved   PLAN: PT FREQUENCY: 1-2x/week  PT DURATION: other: 5 weeks  PLANNED INTERVENTIONS: Therapeutic exercises, Therapeutic activity, Neuromuscular re-education, Balance training, Gait training, Patient/Family education, Self Care, Joint mobilization, Dry Needling, Electrical stimulation, Spinal mobilization, Cryotherapy, Moist heat, Taping, Traction, Ultrasound, Manual therapy, and Re-evaluation.  PLAN FOR NEXT SESSION: nustep, hip flexor stretch, hip extension, and modalities as needed   Kathrynn Ducking, PTA 12/05/2021, 10:58 AM

## 2021-12-08 ENCOUNTER — Encounter: Payer: Self-pay | Admitting: Physical Therapy

## 2021-12-08 ENCOUNTER — Ambulatory Visit: Payer: Medicare Other | Admitting: Physical Therapy

## 2021-12-08 DIAGNOSIS — M5459 Other low back pain: Secondary | ICD-10-CM

## 2021-12-08 NOTE — Therapy (Addendum)
OUTPATIENT PHYSICAL THERAPY THORACOLUMBAR TREATMENT   Patient Name: Adrienne Davidson MRN: [id number] DOB:1956-04-08, 66 y.o., female Today's Date: 12/08/2021   PT End of Session - 12/08/21 0957     Visit Number 10    Number of Visits 10    Date for PT Re-Evaluation 01/19/22    PT Start Time 0946    PT Stop Time 1028    PT Time Calculation (min) 42 min    Activity Tolerance Patient tolerated treatment well    Behavior During Therapy WFL for tasks assessed/performed             Past Medical History:  Diagnosis Date   Allergic rhinitis    Allergy    Anxiety    Arthritis    Diabetes mellitus ([place])    Hyperlipidemia    Hypertension    Psoriasis    Bilateral feet   Sleep apnea    Sleep apnea    Vitamin D deficiency    Past Surgical History:  Procedure Laterality Date   [place]     Patient Active Problem List   Diagnosis Date Noted   Other intervertebral disc degeneration, lumbar region 11/20/2021   Osteopenia after menopause 06/27/2021   Adverse reaction to ACE inhibitor drug 12/19/2015   Angioedema 12/19/2015   Diabetes mellitus ([place]) 12/19/2015   Hypertension associated with diabetes ([place]) 12/19/2015   OSA on CPAP 12/19/2015    PCP: Janora Norlander, DO  REFERRING PROVIDER: Janora Norlander, DO  REFERRING DIAG: Limitation of joint motion of low back  Rationale for Evaluation and Treatment Rehabilitation  THERAPY DIAG:  Other low back pain  ONSET DATE: about 25 years ago   SUBJECTIVE:                                                                                                                                                                                           SUBJECTIVE STATEMENT: Mornings she wakes with stiffness but as she moves it gets better.  PERTINENT HISTORY:  HTN, DM, osteopenia   PAIN:  Are you having pain? No   PRECAUTIONS: Fall  WEIGHT BEARING  RESTRICTIONS No  FALLS:  Has patient fallen in last 6 months? Yes. Number of falls 1, she notes that she fell on Easter when she was walking down steps.    OBJECTIVE:   TODAY'S TREATMENT  12/08/21 EXERCISE LOG  Exercise Repetitions and Resistance Comments  NuStep Lvl 4 x 15 mins   Standing hip extension BLE, 20 reps, red tband, BUE support   Lumbar extension 60# x30 reps   Lumbar flexion 60# x30 reps   Knee extensors 20# x30 reps   Knee flexors 40# x30 reps   Leg press 2 pl, seat 7 x 25 reps with core activation    Blank cell = exercise not performed today    PATIENT EDUCATION:  Education details: HEP, POC Person educated: Patient Education method: Explanation Education comprehension: verbalized understanding   HOME EXERCISE PROGRAM: [id number]  ASSESSMENT:  CLINICAL IMPRESSION: Patient presented in clinic with reports of less LBP with activities. Mornings are typically when stiffness is present but recedes as she becomes more active. Paitent has met all goals and done well with any activity with PT. Patient now reports feeling she is standing more erect and has been told by family members as well. Patient is already in a local small exercise class but is to look into local fitness center as well.   PHYSICAL THERAPY DISCHARGE SUMMARY  Visits from Start of Care: 10  Current functional level related to goals / functional outcomes: Patient has met all of her goals for therapy at this time and felt comfortable being discharged.    Remaining deficits: None    Education / Equipment: HEP   Patient agrees to discharge. Patient goals were met. Patient is being discharged due to meeting the stated rehab goals.  Jacqulynn Cadet, PT, DPT    OBJECTIVE IMPAIRMENTS Abnormal gait, decreased activity tolerance, decreased mobility, difficulty walking, decreased ROM, decreased strength, impaired flexibility, postural dysfunction, and pain.   ACTIVITY  LIMITATIONS lifting, standing, and locomotion level  PARTICIPATION LIMITATIONS: cleaning, shopping, community activity, and yard work  PERSONAL FACTORS Time since onset of injury/illness/exacerbation and 3+ comorbidities: HTN, DM, osteopenia  are also affecting patient's functional outcome.    GOALS: Goals reviewed with patient? Yes  SHORT TERM GOALS: Target date: 11/28/2021  Patient will be independent with her HEP. Baseline: Goal status: Achieved  2.  Patient will be able to stand upright without her familiar pain exceeding 4/10. Baseline:  Goal status: Achieved   LONG TERM GOALS: Target date: 12/12/2021  Patient will be independent with her HEP.  Baseline:  Goal status: Achieved  2.  Patient will be able to complete her daily activities without her familiar pain exceeding 2/10. Baseline:  Goal status: Achieved  3.  Patient will be able to demonstrate at least 10 degrees of active lumbar extension.  Baseline: 12/05/21: 12 degrees of lumbar extension Goal status: Achieved  4.  Patient will be able to carry at least 8 pounds without being limited by her familiar low back pain.  Baseline: 12/05/21: Able to carry 8#  Goal status: Achieved   PLAN: PT FREQUENCY: 1-2x/week  PT DURATION: other: 5 weeks  PLANNED INTERVENTIONS: Therapeutic exercises, Therapeutic activity, Neuromuscular re-education, Balance training, Gait training, Patient/Family education, Self Care, Joint mobilization, Dry Needling, Electrical stimulation, Spinal mobilization, Cryotherapy, Moist heat, Taping, Traction, Ultrasound, Manual therapy, and Re-evaluation.  PLAN FOR NEXT SESSION: nustep, hip flexor stretch, hip extension, and modalities as needed   Standley Brooking, PTA 12/08/2021, 12:29 PM

## 2022-01-11 ENCOUNTER — Encounter: Payer: Self-pay | Admitting: Orthopaedic Surgery

## 2022-01-11 ENCOUNTER — Ambulatory Visit (INDEPENDENT_AMBULATORY_CARE_PROVIDER_SITE_OTHER): Payer: Medicare Other | Admitting: Orthopaedic Surgery

## 2022-01-11 VITALS — Ht 63.0 in | Wt 173.0 lb

## 2022-01-11 DIAGNOSIS — M5136 Other intervertebral disc degeneration, lumbar region: Secondary | ICD-10-CM | POA: Diagnosis not present

## 2022-01-11 NOTE — Progress Notes (Signed)
Office Visit Note   Patient: Adrienne Davidson           Date of Birth: [birth year]           MRN: [id number] Visit Date: 01/11/2022              Requested by: Janora Norlander, DO [place],  [place] [id number] PCP: Janora Norlander, DO   Assessment & Plan: Visit Diagnoses:  1. Other intervertebral disc degeneration, lumbar region     Plan: Patient is doing better after therapy I will recheck her in 4 months she will continue walking program.  We discussed indications for surgery but currently she is improved and is not having left problems to consider surgical intervention at this time.  Follow-Up Instructions: Return in about 4 months (around 05/13/2022).   Orders:  No orders of the defined types were placed in this encounter.  No orders of the defined types were placed in this encounter.     Procedures: No procedures performed   Clinical Data: No additional findings.   Subjective: Chief Complaint  Patient presents with   Lower Back - Pain, Follow-up    HPI 66 year old female returns has been going to physical therapy for grade 2 spondylolisthesis degenerative at L4-5 with primarily back pain and right greater than left hip pain.  She has difficulty walking any length of time specially on cement.  She does better if she moves around has increased discomfort when she sits.  She does have hypertension diabetes.  Patient states she is standing better walking better and is gotten some improvement with therapy.  Review of Systems all other systems are noncontributory HPI.   Objective: Vital Signs: Ht '5\' 3"'$  (1.6 m)   Wt 173 lb (78.5 kg)   BMI 30.65 kg/m   Physical Exam Constitutional:      Appearance: She is well-developed.  HENT:     Head: Normocephalic.     Right Ear: External ear normal.     Left Ear: External ear normal. There is no impacted cerumen.  Eyes:     Pupils: Pupils are equal, round, and reactive to light.  Neck:     Thyroid: No  thyromegaly.     Trachea: No tracheal deviation.  Cardiovascular:     Rate and Rhythm: Normal rate.  Pulmonary:     Effort: Pulmonary effort is normal.  Abdominal:     Palpations: Abdomen is soft.  Musculoskeletal:     Cervical back: No rigidity.  Skin:    General: Skin is warm and dry.  Neurological:     Mental Status: She is alert and oriented to person, place, and time.  Psychiatric:        Behavior: Behavior normal.     Ortho Exam patient has trace anterior tib weakness on the right EHL weakness.  Left side is strong gastrocsoleus is normal no atrophy.  Some of her discomfort with straight leg raising 90 degrees.  Some sciatic notch tenderness and trochanteric bursal tenderness on the right.  Specialty Comments:  No specialty comments available.  Imaging: No results found.   PMFS History: Patient Active Problem List   Diagnosis Date Noted   Other intervertebral disc degeneration, lumbar region 11/20/2021   Osteopenia after menopause 06/27/2021   Adverse reaction to ACE inhibitor drug 12/19/2015   Angioedema 12/19/2015   Diabetes mellitus ([place]) 12/19/2015   Hypertension associated with diabetes ([place]) 12/19/2015   OSA on CPAP 12/19/2015   Past  Medical History:  Diagnosis Date   Allergic rhinitis    Allergy    Anxiety    Arthritis    Diabetes mellitus ([place])    Hyperlipidemia    Hypertension    Psoriasis    Bilateral feet   Sleep apnea    Sleep apnea    Vitamin D deficiency     Family History  Problem Relation Age of Onset   Non-Hodgkin's lymphoma Mother    Arthritis Mother    Cancer Mother    Heart disease Father    Arthritis Father    COPD Father    Hypertension Father    Diabetes Daughter    Rheum arthritis Daughter    Polycystic ovary syndrome Daughter    Breast cancer Neg Hx     Past Surgical History:  Procedure Laterality Date   APPENDECTOMY     CESAREAN SECTION  1980   CESAREAN SECTION  1984   TONSILLECTOMY     Social History    Occupational History   Occupation: Radio broadcast assistant    Comment: retired  Tobacco Use   Smoking status: Former    Packs/day: 0.25    Years: 25.00    Total pack years: 6.25    Types: Cigarettes    Quit date: 06/27/2018    Years since quitting: 3.5   Smokeless tobacco: Never  Vaping Use   Vaping Use: Never used  Substance and Sexual Activity   Alcohol use: Not Currently    Comment: wine once per year   Drug use: Not Currently   Sexual activity: Not Currently    Birth control/protection: None

## 2022-03-05 ENCOUNTER — Encounter: Payer: Self-pay | Admitting: Family Medicine

## 2022-03-05 ENCOUNTER — Ambulatory Visit (INDEPENDENT_AMBULATORY_CARE_PROVIDER_SITE_OTHER): Payer: Medicare Other | Admitting: Family Medicine

## 2022-03-05 VITALS — BP 138/62 | HR 69 | Temp 98.1°F | Ht 63.0 in | Wt 173.8 lb

## 2022-03-05 DIAGNOSIS — R809 Proteinuria, unspecified: Secondary | ICD-10-CM | POA: Diagnosis not present

## 2022-03-05 DIAGNOSIS — I152 Hypertension secondary to endocrine disorders: Secondary | ICD-10-CM | POA: Diagnosis not present

## 2022-03-05 DIAGNOSIS — Z87898 Personal history of other specified conditions: Secondary | ICD-10-CM

## 2022-03-05 DIAGNOSIS — E1169 Type 2 diabetes mellitus with other specified complication: Secondary | ICD-10-CM

## 2022-03-05 DIAGNOSIS — Z23 Encounter for immunization: Secondary | ICD-10-CM

## 2022-03-05 DIAGNOSIS — E785 Hyperlipidemia, unspecified: Secondary | ICD-10-CM

## 2022-03-05 DIAGNOSIS — Z6379 Other stressful life events affecting family and household: Secondary | ICD-10-CM | POA: Diagnosis not present

## 2022-03-05 DIAGNOSIS — E1129 Type 2 diabetes mellitus with other diabetic kidney complication: Secondary | ICD-10-CM

## 2022-03-05 DIAGNOSIS — E1159 Type 2 diabetes mellitus with other circulatory complications: Secondary | ICD-10-CM | POA: Diagnosis not present

## 2022-03-05 LAB — BAYER DCA HB A1C WAIVED: HB A1C (BAYER DCA - WAIVED): 7.1 % — ABNORMAL HIGH (ref 4.8–5.6)

## 2022-03-05 NOTE — Progress Notes (Signed)
Subjective: CC:DM PCP: Janora Norlander, DO Adrienne Davidson is a 66 y.o. female presenting to clinic today for:  1. Type 2 Diabetes with hypertension, hyperlipidemia and microalbuminuria:  Compliant with metformin, Trulicity, Crestor and Norvasc.  No chest pain, shortness breath, visual disturbance reported.  She has noticed slight increased neuropathy in the tips of her fingers so she suspected her blood sugar was likely up.  Last eye exam: UTD Last foot exam: needs Last A1c:  Lab Results  Component Value Date   HGBA1C 6.8 (H) 10/30/2021   Nephropathy screen indicated?: UTD Last flu, zoster and/or pneumovax:  Immunization History  Administered Date(s) Administered   Fluad Quad(high Dose 65+) 01/22/2019, 01/22/2020, 02/27/2021   Influenza Split 02/06/2017   Influenza,inj,Quad PF,6+ Mos 12/20/2017   Moderna Covid-19 Vaccine Bivalent Booster 36yr & up 02/13/2021   Moderna Sars-Covid-2 Vaccination 05/20/2019, 06/17/2019, 03/02/2020   Pneumococcal Conjugate-13 10/27/2018   Pneumococcal Polysaccharide-23 12/20/2015   Tdap 06/27/2021   Zoster Recombinat (Shingrix) 10/27/2018, 01/01/2019   2.  Stress Compliant with Zoloft 100 mg daily.  Has Ativan on hand if needed.  She is really not utilize Ativan despite having increased anxiety related to her husband's illness.  She is pretty much the primary caregiver at this point.  She does make time for herself on Wednesdays do a yoga class and is now looking at a class on Monday because she does feel better after this classes.   ROS: Per HPI  Allergies  Allergen Reactions   Enalapril Anaphylaxis    Angioedema Aug 2018   Past Medical History:  Diagnosis Date   Allergic rhinitis    Allergy    Anxiety    Arthritis    Diabetes mellitus (HCC)    Hyperlipidemia    Hypertension    Psoriasis    Bilateral feet   Sleep apnea    Sleep apnea    Vitamin D deficiency     Current Outpatient Medications:    amLODipine (NORVASC)  5 MG tablet, Take 1 tablet (5 mg total) by mouth daily., Disp: 90 tablet, Rfl: 3   Coenzyme Q10 100 MG capsule, Take by mouth., Disp: , Rfl:    Cranberry 250 MG CAPS, Take by mouth., Disp: , Rfl:    Dulaglutide (TRULICITY) 3 MOM/7.6HMSOPN, Inject 3 mg as directed once a week., Disp: 2 mL, Rfl: 12   EPINEPHrine 0.3 mg/0.3 mL IJ SOAJ injection, Inject 0.3 mg into the muscle as needed for anaphylaxis. Then call 911, Disp: 2 each, Rfl: 0   glucose blood test strip, Test blood sugar once daily (one touch verio if covered by ins/ otherwise whatever ins preference is) E11.65, Disp: 100 each, Rfl: 12   Krill Oil 500 MG CAPS, Take by mouth., Disp: , Rfl:    Lancets (ONETOUCH ULTRASOFT) lancets, Use once daily as instructed E11.65 (one touch verio), Disp: 100 each, Rfl: 12   LORazepam (ATIVAN) 0.5 MG tablet, Take 0.5-1 tablets (0.25-0.5 mg total) by mouth 2 (two) times daily as needed for anxiety., Disp: 10 tablet, Rfl: 0   metFORMIN (GLUCOPHAGE) 1000 MG tablet, Take 1 tablet (1,000 mg total) by mouth 2 (two) times daily with a meal., Disp: 180 tablet, Rfl: 3   Multiple Vitamins-Minerals (CENTRUM ADULTS) TABS, Take by mouth., Disp: , Rfl:    rosuvastatin (CRESTOR) 10 MG tablet, Take 1 tablet (10 mg total) by mouth daily., Disp: 90 tablet, Rfl: 3   sertraline (ZOLOFT) 100 MG tablet, Take 1 tablet (100 mg total) by  mouth daily. NEW DOSE, Disp: 90 tablet, Rfl: 3 Social History   Socioeconomic History   Marital status: Married    Spouse name: Adrienne Davidson   Number of children: 2   Years of education: 12   Highest education level: Associate degree: occupational, Hotel manager, or vocational program  Occupational History   Occupation: paralegal    Comment: retired  Tobacco Use   Smoking status: Former    Packs/day: 0.25    Years: 25.00    Total pack years: 6.25    Types: Cigarettes    Quit date: 06/27/2018    Years since quitting: 3.6   Smokeless tobacco: Never  Vaping Use   Vaping Use: Never used  Substance  and Sexual Activity   Alcohol use: Not Currently    Comment: wine once per year   Drug use: Not Currently   Sexual activity: Not Currently    Birth control/protection: None  Other Topics Concern   Not on file  Social History Narrative   Not on file   Social Determinants of Health   Financial Resource Strain: Low Risk  (07/17/2021)   Overall Financial Resource Strain (CARDIA)    Difficulty of Paying Living Expenses: Not hard at all  Food Insecurity: No Food Insecurity (07/17/2021)   Hunger Vital Sign    Worried About Running Out of Food in the Last Year: Never true    [place] in the Last Year: Never true  Transportation Needs: No Transportation Needs (07/17/2021)   PRAPARE - Hydrologist (Medical): No    Lack of Transportation (Non-Medical): No  Physical Activity: Sufficiently Active (07/17/2021)   Exercise Vital Sign    Days of Exercise per Week: 4 days    Minutes of Exercise per Session: 60 min  Stress: No Stress Concern Present (07/17/2021)   [place]    Feeling of Stress : Only a little  Social Connections: Moderately Integrated (07/17/2021)   Social Connection and Isolation Panel [NHANES]    Frequency of Communication with Friends and Family: Twice a week    Frequency of Social Gatherings with Friends and Family: Twice a week    Attends Religious Services: More than 4 times per year    Active Member of [employer] or Organizations: No    Attends Archivist Meetings: Patient refused    Marital Status: Married  Human resources officer Violence: Not At Risk (07/17/2021)   Humiliation, Afraid, Rape, and Kick questionnaire    Fear of Current or Ex-Partner: No    Emotionally Abused: No    Physically Abused: No    Sexually Abused: No   Family History  Problem Relation Age of Onset   Non-Hodgkin's lymphoma Mother    Arthritis Mother    Cancer Mother    Heart disease Father     Arthritis Father    COPD Father    Hypertension Father    Diabetes Daughter    Rheum arthritis Daughter    Polycystic ovary syndrome Daughter    Breast cancer Neg Hx     Objective: Office vital signs reviewed. BP 138/62   Pulse 69   Temp 98.1 F (36.7 C)   Ht '5\' 3"'$  (1.6 m)   Wt 173 lb 12.8 oz (78.8 kg)   SpO2 97%   BMI 30.79 kg/m   Physical Examination:  General: Awake, alert, well nourished, No acute distress HEENT: sclera white, MMM Cardio: regular rate and rhythm, S1S2  heard, no murmurs appreciated Pulm: clear to auscultation bilaterally, no wheezes, rhonchi or rales; normal work of breathing on room air Extremities: warm, well perfused, No edema, cyanosis or clubbing; +2 pulses bilaterally Skin: Psoriatic plaque noted along the arch and lateral aspect of the right foot Neuro: see DM foot Diabetic Foot Exam - Simple   Simple Foot Form Diabetic Foot exam was performed with the following findings: Yes 03/05/2022 10:00 AM  Visual Inspection No deformities, no ulcerations, no other skin breakdown bilaterally: Yes Sensation Testing Intact to touch and monofilament testing bilaterally: Yes Pulse Check Posterior Tibialis and Dorsalis pulse intact bilaterally: Yes Comments      Assessment/ Plan: 66 y.o. female   Type 2 diabetes mellitus with microalbuminuria, without long-term current use of insulin (HCC) - Plan: Bayer DCA Hb A1c Waived  History of angioedema  Hypertension associated with diabetes ([place])  Hyperlipidemia associated with type 2 diabetes mellitus ([place])  Need for immunization against influenza - Plan: Flu Vaccine QUAD High Dose(Fluad)  Stress due to illness of family member  A1c rising to 7.1.  She has some areas of opportunity in her diet so she is going to make the changes and we will reconvene again in 3 months.  Blood pressure controlled.  No changes.  Not yet due for fasting lipid.  Influenza vaccination administered  Discussed self-care.   She has Ativan on hand if needed but is not utilizing.  Continue Zoloft  Orders Placed This Encounter  Procedures   Bayer DCA Hb A1c Waived   No orders of the defined types were placed in this encounter.    Janora Norlander, DO [place] [phone]

## 2022-03-26 ENCOUNTER — Other Ambulatory Visit: Payer: Self-pay | Admitting: Family Medicine

## 2022-03-26 DIAGNOSIS — Z1231 Encounter for screening mammogram for malignant neoplasm of breast: Secondary | ICD-10-CM

## 2022-03-26 DIAGNOSIS — E785 Hyperlipidemia, unspecified: Secondary | ICD-10-CM

## 2022-03-28 ENCOUNTER — Ambulatory Visit
Admission: RE | Admit: 2022-03-28 | Discharge: 2022-03-28 | Disposition: A | Discharge status: Home / Self Care | Payer: Medicare Other | Source: Ambulatory Visit | Attending: Family Medicine | Admitting: Family Medicine

## 2022-03-28 DIAGNOSIS — Z1231 Encounter for screening mammogram for malignant neoplasm of breast: Secondary | ICD-10-CM | POA: Diagnosis not present

## 2022-05-10 ENCOUNTER — Ambulatory Visit: Payer: Medicare Other | Admitting: Orthopaedic Surgery

## 2022-05-10 ENCOUNTER — Encounter: Payer: Self-pay | Admitting: Orthopaedic Surgery

## 2022-05-10 VITALS — Ht 63.0 in | Wt 173.0 lb

## 2022-05-10 DIAGNOSIS — M5136 Other intervertebral disc degeneration, lumbar region: Secondary | ICD-10-CM

## 2022-05-10 NOTE — Progress Notes (Signed)
Office Visit Note   Patient: Adrienne Davidson           Date of Birth: 04-11-1956           MRN: [id number] Visit Date: 05/10/2022              Requested by: Janora Norlander, DO [place],  [place] [id number] PCP: Janora Norlander, DO   Assessment & Plan: Visit Diagnoses:  1. Other intervertebral disc degeneration, lumbar region     Plan: Patient with anterolisthesis L3-4 L4-5.  She has gotten improvement with therapy and exercise program.  Symptoms are not severe enough to consider proceeding with MRI scan.  Will check her back she has increased symptoms.  Follow-Up Instructions: Return in about 6 months (around 11/08/2022).   Orders:  No orders of the defined types were placed in this encounter.  No orders of the defined types were placed in this encounter.     Procedures: No procedures performed   Clinical Data: No additional findings.   Subjective: Chief Complaint  Patient presents with   Lower Back - Pain    HPI 67 year old female returns she states she finished physical therapy she has been doing her exercises at home including yoga weekly.  She thinks she is gradually improving.  Currently not having any pain but sometimes her back feels tired.  Review of Systems positive diabetes hypertension sleep apnea all other systems noncontributory.   Objective: Vital Signs: Ht '5\' 3"'$  (1.6 m)   Wt 173 lb (78.5 kg)   BMI 30.65 kg/m   Physical Exam Constitutional:      Appearance: She is well-developed.  HENT:     Head: Normocephalic.     Right Ear: External ear normal.     Left Ear: External ear normal. There is no impacted cerumen.  Eyes:     Pupils: Pupils are equal, round, and reactive to light.  Neck:     Thyroid: No thyromegaly.     Trachea: No tracheal deviation.  Cardiovascular:     Rate and Rhythm: Normal rate.  Pulmonary:     Effort: Pulmonary effort is normal.  Abdominal:     Palpations: Abdomen is soft.  Musculoskeletal:      Cervical back: No rigidity.  Skin:    General: Skin is warm and dry.  Neurological:     Mental Status: She is alert and oriented to person, place, and time.  Psychiatric:        Behavior: Behavior normal.     Ortho Exam patient is intact reflexes.  Anterior tib gastrocsoleus is active she gets sitting standing comfortably.  Specialty Comments:  No specialty comments available.  Imaging: No results found.   PMFS History: Patient Active Problem List   Diagnosis Date Noted   Stress due to illness of family member 03/05/2022   Hyperlipidemia associated with type 2 diabetes mellitus ([place]) 03/05/2022   History of angioedema 03/05/2022   Other intervertebral disc degeneration, lumbar region 11/20/2021   Osteopenia after menopause 06/27/2021   Adverse reaction to ACE inhibitor drug 12/19/2015   Angioedema 12/19/2015   Diabetes mellitus ([facility]) 12/19/2015   Hypertension associated with diabetes ([place]) 12/19/2015   OSA on CPAP 12/19/2015   Past Medical History:  Diagnosis Date   Allergic rhinitis    Allergy    Anxiety    Arthritis    Diabetes mellitus ([facility])    Hyperlipidemia    Hypertension    Psoriasis  Bilateral feet   Sleep apnea    Sleep apnea    Vitamin D deficiency     Family History  Problem Relation Age of Onset   Non-Hodgkin's lymphoma Mother    Arthritis Mother    Cancer Mother    Heart disease Father    Arthritis Father    COPD Father    Hypertension Father    Diabetes Daughter    Rheum arthritis Daughter    Polycystic ovary syndrome Daughter    Breast cancer Neg Hx     Past Surgical History:  Procedure Laterality Date   APPENDECTOMY     CESAREAN SECTION  1980   CESAREAN SECTION  1984   TONSILLECTOMY     Social History   Occupational History   Occupation: Radio broadcast assistant    Comment: retired  Tobacco Use   Smoking status: Former    Packs/day: 0.25    Years: 25.00    Total pack years: 6.25    Types: Cigarettes    Quit date: 06/27/2018    Years  since quitting: 3.8   Smokeless tobacco: Never  Vaping Use   Vaping Use: Never used  Substance and Sexual Activity   Alcohol use: Not Currently    Comment: wine once per year   Drug use: Not Currently   Sexual activity: Not Currently    Birth control/protection: None

## 2022-06-13 ENCOUNTER — Ambulatory Visit (INDEPENDENT_AMBULATORY_CARE_PROVIDER_SITE_OTHER): Payer: Medicare Other | Admitting: Family Medicine

## 2022-06-13 ENCOUNTER — Encounter: Payer: Self-pay | Admitting: Family Medicine

## 2022-06-13 VITALS — BP 121/72 | HR 76 | Temp 98.9°F | Ht 63.0 in | Wt 168.0 lb

## 2022-06-13 DIAGNOSIS — E1169 Type 2 diabetes mellitus with other specified complication: Secondary | ICD-10-CM | POA: Diagnosis not present

## 2022-06-13 DIAGNOSIS — M8580 Other specified disorders of bone density and structure, unspecified site: Secondary | ICD-10-CM

## 2022-06-13 DIAGNOSIS — R809 Proteinuria, unspecified: Secondary | ICD-10-CM | POA: Diagnosis not present

## 2022-06-13 DIAGNOSIS — F418 Other specified anxiety disorders: Secondary | ICD-10-CM

## 2022-06-13 DIAGNOSIS — E785 Hyperlipidemia, unspecified: Secondary | ICD-10-CM | POA: Diagnosis not present

## 2022-06-13 DIAGNOSIS — Z780 Asymptomatic menopausal state: Secondary | ICD-10-CM | POA: Diagnosis not present

## 2022-06-13 DIAGNOSIS — I152 Hypertension secondary to endocrine disorders: Secondary | ICD-10-CM | POA: Diagnosis not present

## 2022-06-13 DIAGNOSIS — Z87898 Personal history of other specified conditions: Secondary | ICD-10-CM | POA: Diagnosis not present

## 2022-06-13 DIAGNOSIS — E1159 Type 2 diabetes mellitus with other circulatory complications: Secondary | ICD-10-CM | POA: Diagnosis not present

## 2022-06-13 DIAGNOSIS — E1129 Type 2 diabetes mellitus with other diabetic kidney complication: Secondary | ICD-10-CM | POA: Diagnosis not present

## 2022-06-13 DIAGNOSIS — F40242 Fear of bridges: Secondary | ICD-10-CM

## 2022-06-13 DIAGNOSIS — R6889 Other general symptoms and signs: Secondary | ICD-10-CM | POA: Diagnosis not present

## 2022-06-13 LAB — BAYER DCA HB A1C WAIVED: HB A1C (BAYER DCA - WAIVED): 8 % — ABNORMAL HIGH (ref 4.8–5.6)

## 2022-06-13 MED ORDER — EPINEPHRINE 0.3 MG/0.3ML IJ SOAJ: 0.3000 mg | INTRAMUSCULAR | 0 refills | Status: DC | PRN

## 2022-06-13 MED ORDER — METFORMIN HCL 1000 MG PO TABS: 1000.0000 mg | ORAL_TABLET | Freq: Two times a day (BID) | ORAL | 0 refills | Status: DC

## 2022-06-13 MED ORDER — SERTRALINE HCL 100 MG PO TABS: 100.0000 mg | ORAL_TABLET | Freq: Every day | ORAL | 3 refills | Status: DC

## 2022-06-13 MED ORDER — DAPAGLIFLOZIN PROPANEDIOL 5 MG PO TABS: 5.0000 mg | ORAL_TABLET | Freq: Every day | ORAL | 0 refills | Status: DC

## 2022-06-13 MED ORDER — ROSUVASTATIN CALCIUM 10 MG PO TABS: 10.0000 mg | ORAL_TABLET | Freq: Every day | ORAL | 3 refills | Status: DC

## 2022-06-13 MED ORDER — AMLODIPINE BESYLATE 5 MG PO TABS: 5.0000 mg | ORAL_TABLET | Freq: Every day | ORAL | 3 refills | Status: DC
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
## 2022-06-14 LAB — CMP14+EGFR
ALT: 15 IU/L (ref 0–32)
AST: 16 IU/L (ref 0–40)
Albumin/Globulin Ratio: 1.9 (ref 1.2–2.2)
Albumin: 4.4 g/dL (ref 3.9–4.9)
Alkaline Phosphatase: 59 IU/L (ref 44–121)
BUN/Creatinine Ratio: 16 (ref 12–28)
BUN: 16 mg/dL (ref 8–27)
Bilirubin Total: 0.4 mg/dL (ref 0.0–1.2)
CO2: 23 mmol/L (ref 20–29)
Calcium: 10.1 mg/dL (ref 8.7–10.3)
Chloride: 101 mmol/L (ref 96–106)
Creatinine, Ser: 0.97 mg/dL (ref 0.57–1.00)
Globulin, Total: 2.3 g/dL (ref 1.5–4.5)
Glucose: 123 mg/dL — ABNORMAL HIGH (ref 70–99)
Potassium: 4.2 mmol/L (ref 3.5–5.2)
Sodium: 142 mmol/L (ref 134–144)
Total Protein: 6.7 g/dL (ref 6.0–8.5)
eGFR: 64 mL/min/{1.73_m2} (ref >59)

## 2022-06-14 LAB — CBC
Hematocrit: 35.9 % (ref 34.0–46.6)
Hemoglobin: 11.9 g/dL (ref 11.1–15.9)
MCH: 28.3 pg (ref 26.6–33.0)
MCHC: 33.1 g/dL (ref 31.5–35.7)
MCV: 86 fL (ref 79–97)
Platelets: 262 10*3/uL (ref 150–450)
RBC: 4.2 x10E6/uL (ref 3.77–5.28)
RDW: 13.6 % (ref 11.7–15.4)
WBC: 6.5 10*3/uL (ref 3.4–10.8)

## 2022-06-14 LAB — LIPID PANEL
Chol/HDL Ratio: 2.1 ratio (ref 0.0–4.4)
Cholesterol, Total: 136 mg/dL (ref 100–199)
HDL: 65 mg/dL (ref >39)
LDL Chol Calc (NIH): 51 mg/dL (ref 0–99)
Triglycerides: 109 mg/dL (ref 0–149)
VLDL Cholesterol Cal: 20 mg/dL (ref 5–40)

## 2022-06-14 LAB — VITAMIN D 25 HYDROXY (VIT D DEFICIENCY, FRACTURES): Vit D, 25-Hydroxy: 76.9 ng/mL (ref 30.0–100.0)

## 2022-06-14 LAB — TSH: TSH: 1.09 u[IU]/mL (ref 0.450–4.500)

## 2022-07-11 ENCOUNTER — Other Ambulatory Visit: Payer: Self-pay | Admitting: Family Medicine

## 2022-07-11 DIAGNOSIS — R809 Proteinuria, unspecified: Secondary | ICD-10-CM

## 2022-07-19 ENCOUNTER — Ambulatory Visit (INDEPENDENT_AMBULATORY_CARE_PROVIDER_SITE_OTHER): Payer: Medicare Other

## 2022-07-19 VITALS — Ht 62.0 in | Wt 161.0 lb

## 2022-07-19 DIAGNOSIS — Z0000 Encounter for general adult medical examination without abnormal findings: Secondary | ICD-10-CM | POA: Diagnosis not present

## 2022-07-19 NOTE — Patient Instructions (Signed)
[name] , Thank you for taking time to come for your Medicare Wellness Visit. I appreciate your ongoing commitment to your health goals. Please review the following plan we discussed and let me know if I can assist you in the future.   These are the goals we discussed:  Goals      Patient Stated     07/15/2020 AWV Goal: Exercise for General Health  Patient will verbalize understanding of the benefits of increased physical activity: Exercising regularly is important. It will improve your overall fitness, flexibility, and endurance. Regular exercise also will improve your overall health. It can help you control your weight, reduce stress, and improve your bone density. Over the next year, patient will increase physical activity as tolerated with a goal of at least 150 minutes of moderate physical activity per week.  You can tell that you are exercising at a moderate intensity if your heart starts beating faster and you start breathing faster but can still hold a conversation. Moderate-intensity exercise ideas include: Walking 1 mile (1.6 km) in about 15 minutes Biking Hiking Golfing Dancing Water aerobics Patient will verbalize understanding of everyday activities that increase physical activity by providing examples like the following: Yard work, such as: Sales promotion account executive Gardening Washing windows or floors Patient will be able to explain general safety guidelines for exercising:  Before you start a new exercise program, talk with your health care provider. Do not exercise so much that you hurt yourself, feel dizzy, or get very short of breath. Wear comfortable clothes and wear shoes with good support. Drink plenty of water while you exercise to prevent dehydration or heat stroke. Work out until your breathing and your heartbeat get faster.         This is a list of the screening recommended  for you and due dates:  Health Maintenance  Topic Date Due   COVID-19 Vaccine (5 - 2023-24 season) 12/22/2021   Eye exam for diabetics  07/12/2022   Yearly kidney health urinalysis for diabetes  10/31/2022   Hemoglobin A1C  12/12/2022   Complete foot exam   03/06/2023   Mammogram  03/29/2023   Yearly kidney function blood test for diabetes  06/14/2023   DEXA scan (bone density measurement)  06/28/2023   Medicare Annual Wellness Visit  07/19/2023   Pneumonia Vaccine (3 of 3 - PPSV23 or PCV20) 10/27/2023   Colon Cancer Screening  05/30/2027   DTaP/Tdap/Td vaccine (2 - Td or Tdap) 06/28/2031   Flu Shot  Completed   Hepatitis C Screening: USPSTF Recommendation to screen - Ages 18-79 yo.  Completed   Zoster (Shingles) Vaccine  Completed   HPV Vaccine  Aged Out    Advanced directives: Please bring a copy of your health care power of attorney and living will to the office to be added to your chart at your convenience.   Conditions/risks identified: Aim for 30 minutes of exercise or brisk walking, 6-8 glasses of water, and 5 servings of fruits and vegetables each day.   Next appointment: Follow up in one year for your annual wellness visit    Preventive Care 65 Years and Older, Female Preventive care refers to lifestyle choices and visits with your health care provider that can promote health and wellness. What does preventive care include? A yearly physical exam. This is also called an annual well check. Dental exams once or twice a year. Routine eye  exams. Ask your health care provider how often you should have your eyes checked. Personal lifestyle choices, including: Daily care of your teeth and gums. Regular physical activity. Eating a healthy diet. Avoiding tobacco and drug use. Limiting alcohol use. Practicing safe sex. Taking low-dose aspirin every day. Taking vitamin and mineral supplements as recommended by your health care provider. What happens during an annual well  check? The services and screenings done by your health care provider during your annual well check will depend on your age, overall health, lifestyle risk factors, and family history of disease. Counseling  Your health care provider may ask you questions about your: Alcohol use. Tobacco use. Drug use. Emotional well-being. Home and relationship well-being. Sexual activity. Eating habits. History of falls. Memory and ability to understand (cognition). Work and work Statistician. Reproductive health. Screening  You may have the following tests or measurements: Height, weight, and BMI. Blood pressure. Lipid and cholesterol levels. These may be checked every 5 years, or more frequently if you are over 43 years old. Skin check. Lung cancer screening. You may have this screening every year starting at age 61 if you have a 30-pack-year history of smoking and currently smoke or have quit within the past 15 years. Fecal occult blood test (FOBT) of the stool. You may have this test every year starting at age 72. Flexible sigmoidoscopy or colonoscopy. You may have a sigmoidoscopy every 5 years or a colonoscopy every 10 years starting at age 47. Hepatitis C blood test. Hepatitis B blood test. Sexually transmitted disease (STD) testing. Diabetes screening. This is done by checking your blood sugar (glucose) after you have not eaten for a while (fasting). You may have this done every 1-3 years. Bone density scan. This is done to screen for osteoporosis. You may have this done starting at age 44. Mammogram. This may be done every 1-2 years. Talk to your health care provider about how often you should have regular mammograms. Talk with your health care provider about your test results, treatment options, and if necessary, the need for more tests. Vaccines  Your health care provider may recommend certain vaccines, such as: Influenza vaccine. This is recommended every year. Tetanus, diphtheria, and  acellular pertussis (Tdap, Td) vaccine. You may need a Td booster every 10 years. Zoster vaccine. You may need this after age 25. Pneumococcal 13-valent conjugate (PCV13) vaccine. One dose is recommended after age 17. Pneumococcal polysaccharide (PPSV23) vaccine. One dose is recommended after age 57. Talk to your health care provider about which screenings and vaccines you need and how often you need them. This information is not intended to replace advice given to you by your health care provider. Make sure you discuss any questions you have with your health care provider. Document Released: 05/06/2015 Document Revised: 12/28/2015 Document Reviewed: 02/08/2015 Elsevier Interactive Patient Education  2017 [place] Prevention in the Home Falls can cause injuries. They can happen to people of all ages. There are many things you can do to make your home safe and to help prevent falls. What can I do on the outside of my home? Regularly fix the edges of walkways and driveways and fix any cracks. Remove anything that might make you trip as you walk through a door, such as a raised step or threshold. Trim any bushes or trees on the path to your home. Use bright outdoor lighting. Clear any walking paths of anything that might make someone trip, such as rocks or tools. Regularly check  to see if handrails are loose or broken. Make sure that both sides of any steps have handrails. Any raised decks and porches should have guardrails on the edges. Have any leaves, snow, or ice cleared regularly. Use sand or salt on walking paths during winter. Clean up any spills in your garage right away. This includes oil or grease spills. What can I do in the bathroom? Use night lights. Install grab bars by the toilet and in the tub and shower. Do not use towel bars as grab bars. Use non-skid mats or decals in the tub or shower. If you need to sit down in the shower, use a plastic, non-slip stool. Keep  the floor dry. Clean up any water that spills on the floor as soon as it happens. Remove soap buildup in the tub or shower regularly. Attach bath mats securely with double-sided non-slip rug tape. Do not have throw rugs and other things on the floor that can make you trip. What can I do in the bedroom? Use night lights. Make sure that you have a light by your bed that is easy to reach. Do not use any sheets or blankets that are too big for your bed. They should not hang down onto the floor. Have a firm chair that has side arms. You can use this for support while you get dressed. Do not have throw rugs and other things on the floor that can make you trip. What can I do in the kitchen? Clean up any spills right away. Avoid walking on wet floors. Keep items that you use a lot in easy-to-reach places. If you need to reach something above you, use a strong step stool that has a grab bar. Keep electrical cords out of the way. Do not use floor polish or wax that makes floors slippery. If you must use wax, use non-skid floor wax. Do not have throw rugs and other things on the floor that can make you trip. What can I do with my stairs? Do not leave any items on the stairs. Make sure that there are handrails on both sides of the stairs and use them. Fix handrails that are broken or loose. Make sure that handrails are as long as the stairways. Check any carpeting to make sure that it is firmly attached to the stairs. Fix any carpet that is loose or worn. Avoid having throw rugs at the top or bottom of the stairs. If you do have throw rugs, attach them to the floor with carpet tape. Make sure that you have a light switch at the top of the stairs and the bottom of the stairs. If you do not have them, ask someone to add them for you. What else can I do to help prevent falls? Wear shoes that: Do not have high heels. Have rubber bottoms. Are comfortable and fit you well. Are closed at the toe. Do not  wear sandals. If you use a stepladder: Make sure that it is fully opened. Do not climb a closed stepladder. Make sure that both sides of the stepladder are locked into place. Ask someone to hold it for you, if possible. Clearly mark and make sure that you can see: Any grab bars or handrails. First and last steps. Where the edge of each step is. Use tools that help you move around (mobility aids) if they are needed. These include: Canes. Walkers. Scooters. Crutches. Turn on the lights when you go into a dark area. Replace any light bulbs  as soon as they burn out. Set up your furniture so you have a clear path. Avoid moving your furniture around. If any of your floors are uneven, fix them. If there are any pets around you, be aware of where they are. Review your medicines with your doctor. Some medicines can make you feel dizzy. This can increase your chance of falling. Ask your doctor what other things that you can do to help prevent falls. This information is not intended to replace advice given to you by your health care provider. Make sure you discuss any questions you have with your health care provider. Document Released: 02/03/2009 Document Revised: 09/15/2015 Document Reviewed: 05/14/2014 Elsevier Interactive Patient Education  2017 [employer].

## 2022-07-19 NOTE — Progress Notes (Signed)
Subjective:   Adrienne Davidson is a 67 y.o. female who presents for Medicare Annual (Subsequent) preventive examination. I connected with  [name] on 07/19/22 by a audio enabled telemedicine application and verified that I am speaking with the correct person using two identifiers.  Patient Location: Home  Provider Location: Home Office  I discussed the limitations of evaluation and management by telemedicine. The patient expressed understanding and agreed to proceed.  Review of Systems     Cardiac Risk Factors include: advanced age (>66men, >50 women);diabetes mellitus;hypertension;dyslipidemia     Objective:    Today's Vitals   07/19/22 1037  Weight: 161 lb (73 kg)  Height: 5\' 2"  (1.575 m)   Body mass index is 29.45 kg/m.     07/19/2022   10:41 AM 11/07/2021   12:51 PM 07/17/2021   10:39 AM 07/15/2020    8:25 AM 07/15/2019    8:05 AM  Advanced Directives  Does Patient Have a Medical Advance Directive? Yes No Yes Yes Yes  Type of Paramedic of [place];Living will  [place];Living will [place];Living will;Out of facility DNR (pink MOST or yellow form) Living will;Healthcare Power of Attorney  Does patient want to make changes to medical advance directive?     No - Patient declined  Copy of [place] in Chart? No - copy requested  No - copy requested No - copy requested     Current Medications (verified) Outpatient Encounter Medications as of 07/19/2022  Medication Sig   amLODipine (NORVASC) 5 MG tablet Take 1 tablet (5 mg total) by mouth daily.   Coenzyme Q10 100 MG capsule Take by mouth.   Cranberry 250 MG CAPS Take by mouth.   Dulaglutide (TRULICITY) 3 0000000 SOPN Inject 3 mg as directed once a week.   EPINEPHrine 0.3 mg/0.3 mL IJ SOAJ injection Inject 0.3 mg into the muscle as needed for anaphylaxis. Then call 911   FARXIGA 5 MG TABS tablet Take 1 tablet (5 mg total) by mouth  daily.   glucose blood test strip Test blood sugar once daily (one touch verio if covered by ins/ otherwise whatever ins preference is) E11.65   Krill Oil 500 MG CAPS Take by mouth.   Lancets (ONETOUCH ULTRASOFT) lancets Use once daily as instructed E11.65 (one touch verio)   LORazepam (ATIVAN) 0.5 MG tablet Take 0.5-1 tablets (0.25-0.5 mg total) by mouth 2 (two) times daily as needed for anxiety.   metFORMIN (GLUCOPHAGE) 1000 MG tablet Take 1 tablet (1,000 mg total) by mouth 2 (two) times daily with a meal.   Multiple Vitamins-Minerals (CENTRUM ADULTS) TABS Take by mouth.   rosuvastatin (CRESTOR) 10 MG tablet Take 1 tablet (10 mg total) by mouth daily.   sertraline (ZOLOFT) 100 MG tablet Take 1 tablet (100 mg total) by mouth daily. NEW DOSE   No facility-administered encounter medications on file as of 07/19/2022.    Allergies (verified) Enalapril   History: Past Medical History:  Diagnosis Date   Allergic rhinitis    Allergy    Anxiety    Arthritis    Diabetes mellitus ([place])    Hyperlipidemia    Hypertension    Psoriasis    Bilateral feet   Sleep apnea    Sleep apnea    Vitamin D deficiency    Past Surgical History:  Procedure Laterality Date   [place]  TONSILLECTOMY     Family History  Problem Relation Age of Onset   Non-Hodgkin's lymphoma Mother    Arthritis Mother    Cancer Mother    Heart disease Father    Arthritis Father    COPD Father    Hypertension Father    Diabetes Daughter    Rheum arthritis Daughter    Polycystic ovary syndrome Daughter    Breast cancer Neg Hx    Social History   Socioeconomic History   Marital status: Married    Spouse name: [name]   Number of children: 2   Years of education: 12   Highest education level: Associate degree: occupational, Hotel manager, or vocational program  Occupational History   Occupation: paralegal    Comment: retired  Tobacco Use   Smoking status:  Former    Packs/day: 0.25    Years: 25.00    Additional pack years: 0.00    Total pack years: 6.25    Types: Cigarettes    Quit date: 06/27/2018    Years since quitting: 4.0   Smokeless tobacco: Never  Vaping Use   Vaping Use: Never used  Substance and Sexual Activity   Alcohol use: Not Currently    Comment: wine once per year   Drug use: Not Currently   Sexual activity: Not Currently    Birth control/protection: None  Other Topics Concern   Not on file  Social History Narrative   Not on file   Social Determinants of Health   Financial Resource Strain: Low Risk  (07/19/2022)   Overall Financial Resource Strain (CARDIA)    Difficulty of Paying Living Expenses: Not hard at all  Food Insecurity: No Food Insecurity (07/19/2022)   Hunger Vital Sign    Worried About Running Out of Food in the Last Year: Never true    [place] in the Last Year: Never true  Transportation Needs: No Transportation Needs (07/19/2022)   PRAPARE - Hydrologist (Medical): No    Lack of Transportation (Non-Medical): No  Physical Activity: Insufficiently Active (07/19/2022)   Exercise Vital Sign    Days of Exercise per Week: 3 days    Minutes of Exercise per Session: 30 min  Stress: No Stress Concern Present (07/19/2022)   [place]    Feeling of Stress : Not at all  Social Connections: Moderately Isolated (07/19/2022)   Social Connection and Isolation Panel [NHANES]    Frequency of Communication with Friends and Family: More than three times a week    Frequency of Social Gatherings with Friends and Family: More than three times a week    Attends Religious Services: Never    Marine scientist or Organizations: No    Attends Music therapist: Never    Marital Status: Married    Tobacco Counseling Counseling given: Not Answered   Clinical Intake:  Pre-visit preparation completed:  Yes  Pain : No/denies pain     Nutritional Risks: None Diabetes: Yes CBG done?: No Did pt. bring in CBG monitor from home?: No  How often do you need to have someone help you when you read instructions, pamphlets, or other written materials from your doctor or pharmacy?: 1 - Never  Diabetic?yes  Nutrition Risk Assessment:  Has the patient had any N/V/D within the last 2 months?  No  Does the patient have any non-healing wounds?  No  Has the patient had any unintentional  weight loss or weight gain?  No   Diabetes:  Is the patient diabetic?  Yes  If diabetic, was a CBG obtained today?  No  Did the patient bring in their glucometer from home?  No  How often do you monitor your CBG's? Once a week .   Financial Strains and Diabetes Management:  Are you having any financial strains with the device, your supplies or your medication? No .  Does the patient want to be seen by Chronic Care Management for management of their diabetes?  No  Would the patient like to be referred to a Nutritionist or for Diabetic Management?  No   Diabetic Exams:  Diabetic Eye Exam: Completed 06/2021 Diabetic Foot Exam: Overdue, Pt has been advised about the importance in completing this exam. Pt is scheduled for diabetic foot exam on next office visit .   Interpreter Needed?: No  Information entered by :: Jadene Pierini, LPN   Activities of Daily Living    07/19/2022   10:41 AM 07/15/2022   12:15 PM  In your present state of health, do you have any difficulty performing the following activities:  Hearing? 0 0  Vision? 0 0  Difficulty concentrating or making decisions? 0 1  Walking or climbing stairs? 0 0  Dressing or bathing? 0 0  Doing errands, shopping? 0 0  Preparing Food and eating ? N N  Using the Toilet? N N  In the past six months, have you accidently leaked urine? N Y  Do you have problems with loss of bowel control? N Y  Managing your Medications? N N  Managing your Finances? N N   Housekeeping or managing your Housekeeping? N N    Patient Care Team: Janora Norlander, DO as PCP - General (Family Medicine) Lavera Guise, [facility] (Pharmacist) [name], MD as Consulting Physician (Pulmonary Disease) Rolm Bookbinder, MD as Consulting Physician (Dermatology)  Indicate any recent Medical Services you may have received from other than [facility] providers in the past year (date may be approximate).     Assessment:   This is a routine wellness examination for [place].  Hearing/Vision screen Vision Screening - Comments:: Wears rx glasses - up to date with routine eye exams with  Dr.Johnson   Dietary issues and exercise activities discussed: Current Exercise Habits: Home exercise routine, Type of exercise: walking;yoga, Time (Minutes): 30, Frequency (Times/Week): 3, Weekly Exercise (Minutes/Week): 90, Intensity: Mild, Exercise limited by: None identified   Goals Addressed             This Visit's Progress    Patient Stated   On track    07/15/2020 AWV Goal: Exercise for General Health  Patient will verbalize understanding of the benefits of increased physical activity: Exercising regularly is important. It will improve your overall fitness, flexibility, and endurance. Regular exercise also will improve your overall health. It can help you control your weight, reduce stress, and improve your bone density. Over the next year, patient will increase physical activity as tolerated with a goal of at least 150 minutes of moderate physical activity per week.  You can tell that you are exercising at a moderate intensity if your heart starts beating faster and you start breathing faster but can still hold a conversation. Moderate-intensity exercise ideas include: Walking 1 mile (1.6 km) in about 15 minutes Biking Hiking Golfing Dancing Water aerobics Patient will verbalize understanding of everyday activities that increase physical activity by providing examples like the  following: Yard work, such  as: Pushing a Psychologist, counselling your car Pushing a stroller Shoveling snow Gardening Washing windows or floors Patient will be able to explain general safety guidelines for exercising:  Before you start a new exercise program, talk with your health care provider. Do not exercise so much that you hurt yourself, feel dizzy, or get very short of breath. Wear comfortable clothes and wear shoes with good support. Drink plenty of water while you exercise to prevent dehydration or heat stroke. Work out until your breathing and your heartbeat get faster.        Depression Screen    07/19/2022   10:40 AM 03/05/2022    9:44 AM 10/30/2021   10:49 AM 07/17/2021   10:36 AM 06/27/2021    8:26 AM 02/27/2021    8:16 AM 12/19/2020    1:42 PM  PHQ 2/9 Scores  PHQ - 2 Score 0 2 2 0 0 0 0  PHQ- 9 Score  4 4 1  0 2     Fall Risk    07/19/2022   10:39 AM 07/15/2022   12:15 PM 03/05/2022    9:44 AM 10/30/2021   10:49 AM 07/17/2021   10:25 AM  Fall Risk   Falls in the past year? 0 1 1 1  0  Number falls in past yr: 0 1 1 0 0  Injury with Fall? 0 0 0 1 0  Risk for fall due to : No Fall Risks  History of fall(s) History of fall(s) No Fall Risks  Follow up Falls prevention discussed  Education provided Falls evaluation completed Falls prevention discussed    FALL RISK PREVENTION PERTAINING TO THE HOME:  Any stairs in or around the home? Yes  If so, are there any without handrails? No  Home free of loose throw rugs in walkways, pet beds, electrical cords, etc? Yes  Adequate lighting in your home to reduce risk of falls? No   ASSISTIVE DEVICES UTILIZED TO PREVENT FALLS:  Life alert? No  Use of a cane, walker or w/c? No  Grab bars in the bathroom? Yes  Shower chair or bench in shower? Yes  Elevated toilet seat or a handicapped toilet? No          07/19/2022   10:41 AM 07/17/2021   10:40 AM 07/15/2020    8:28 AM 07/15/2019    8:08 AM  6CIT  Screen  What Year? 0 points 0 points 0 points 0 points  What month? 0 points 0 points 0 points 0 points  What time? 0 points 0 points 0 points 0 points  Count back from 20 0 points 0 points 0 points 0 points  Months in reverse 0 points 0 points 0 points 0 points  Repeat phrase 0 points 0 points 0 points 0 points  Total Score 0 points 0 points 0 points 0 points    Immunizations Immunization History  Administered Date(s) Administered   Fluad Quad(high Dose 65+) 01/22/2019, 01/22/2020, 02/27/2021, 03/05/2022   Influenza Split 02/06/2017   Influenza,inj,Quad PF,6+ Mos 12/20/2017   Moderna Covid-19 Vaccine Bivalent Booster 34yrs & up 02/13/2021   Moderna Sars-Covid-2 Vaccination 05/20/2019, 06/17/2019, 03/02/2020   Pneumococcal Conjugate-13 10/27/2018   Pneumococcal Polysaccharide-23 12/20/2015   Tdap 06/27/2021   Zoster Recombinat (Shingrix) 10/27/2018, 01/01/2019    TDAP status: Up to date  Flu Vaccine status: Up to date  Pneumococcal vaccine status: Up to date  Covid-19 vaccine status: Completed vaccines  Qualifies for Shingles Vaccine? Yes  Zostavax completed Yes   Shingrix Completed?: Yes  Screening Tests Health Maintenance  Topic Date Due   COVID-19 Vaccine (5 - 2023-24 season) 12/22/2021   OPHTHALMOLOGY EXAM  07/12/2022   Diabetic kidney evaluation - Urine ACR  10/31/2022   HEMOGLOBIN A1C  12/12/2022   FOOT EXAM  03/06/2023   MAMMOGRAM  03/29/2023   Diabetic kidney evaluation - eGFR measurement  06/14/2023   DEXA SCAN  06/28/2023   Medicare Annual Wellness (AWV)  07/19/2023   Pneumonia Vaccine 7+ Years old (3 of 3 - PPSV23 or PCV20) 10/27/2023   COLONOSCOPY (Pts 45-51yrs Insurance coverage will need to be confirmed)  05/30/2027   DTaP/Tdap/Td (2 - Td or Tdap) 06/28/2031   INFLUENZA VACCINE  Completed   Hepatitis C Screening  Completed   Zoster Vaccines- Shingrix  Completed   HPV VACCINES  Aged Out    Health Maintenance  Health Maintenance Due  Topic  Date Due   COVID-19 Vaccine (5 - 2023-24 season) 12/22/2021   OPHTHALMOLOGY EXAM  07/12/2022    Colorectal cancer screening: Type of screening: Colonoscopy. Completed 05/29/2017 patient to call schedule has received a letter in mail . Repeat every 5 years  Mammogram status: Completed 03/28/2022. Repeat every year  Bone Density status: Completed 06/27/2021. Results reflect: Bone density results: OSTEOPOROSIS. Repeat every 2 years.  Lung Cancer Screening: (Low Dose CT Chest recommended if Age 86-80 years, 30 pack-year currently smoking OR have quit w/in 15years.) does not qualify.   Lung Cancer Screening Referral: n/a  Additional Screening:  Hepatitis C Screening: does not qualify; Completed 03/28/2018  Vision Screening: Recommended annual ophthalmology exams for early detection of glaucoma and other disorders of the eye. Is the patient up to date with their annual eye exam?  Yes  Who is the provider or what is the name of the office in which the patient attends annual eye exams? Dr.Johnson  If pt is not established with a provider, would they like to be referred to a provider to establish care? No .   Dental Screening: Recommended annual dental exams for proper oral hygiene  Community Resource Referral / Chronic Care Management: CRR required this visit?  No   CCM required this visit?  No      Plan:     I have personally reviewed and noted the following in the patient's chart:   Medical and social history Use of alcohol, tobacco or illicit drugs  Current medications and supplements including opioid prescriptions. Patient is not currently taking opioid prescriptions. Functional ability and status Nutritional status Physical activity Advanced directives List of other physicians Hospitalizations, surgeries, and ER visits in previous 12 months Vitals Screenings to include cognitive, depression, and falls Referrals and appointments  In addition, I have reviewed and  discussed with patient certain preventive protocols, quality metrics, and best practice recommendations. A written personalized care plan for preventive services as well as general preventive health recommendations were provided to patient.     Daphane Shepherd, LPN   [id number]   Nurse Notes: Patient will schedule Colonoscopy

## 2022-08-01 DIAGNOSIS — L403 Pustulosis palmaris et plantaris: Secondary | ICD-10-CM | POA: Diagnosis not present

## 2022-08-01 DIAGNOSIS — L812 Freckles: Secondary | ICD-10-CM | POA: Diagnosis not present

## 2022-08-01 DIAGNOSIS — L821 Other seborrheic keratosis: Secondary | ICD-10-CM | POA: Diagnosis not present

## 2022-08-01 DIAGNOSIS — L720 Epidermal cyst: Secondary | ICD-10-CM | POA: Diagnosis not present

## 2022-08-01 DIAGNOSIS — L570 Actinic keratosis: Secondary | ICD-10-CM | POA: Diagnosis not present

## 2022-08-01 DIAGNOSIS — D1801 Hemangioma of skin and subcutaneous tissue: Secondary | ICD-10-CM | POA: Diagnosis not present

## 2022-08-13 ENCOUNTER — Other Ambulatory Visit: Payer: Self-pay | Admitting: Family Medicine

## 2022-08-13 DIAGNOSIS — E1129 Type 2 diabetes mellitus with other diabetic kidney complication: Secondary | ICD-10-CM

## 2022-08-28 ENCOUNTER — Other Ambulatory Visit: Payer: Self-pay | Admitting: Family Medicine

## 2022-08-28 DIAGNOSIS — F40242 Fear of bridges: Secondary | ICD-10-CM

## 2022-08-28 DIAGNOSIS — F418 Other specified anxiety disorders: Secondary | ICD-10-CM

## 2022-09-21 ENCOUNTER — Encounter: Payer: Self-pay | Admitting: Family Medicine

## 2022-09-21 ENCOUNTER — Ambulatory Visit (INDEPENDENT_AMBULATORY_CARE_PROVIDER_SITE_OTHER): Payer: Medicare Other | Admitting: Family Medicine

## 2022-09-21 VITALS — BP 131/68 | HR 71 | Temp 98.6°F | Ht 62.0 in | Wt 162.4 lb

## 2022-09-21 DIAGNOSIS — R809 Proteinuria, unspecified: Secondary | ICD-10-CM

## 2022-09-21 DIAGNOSIS — E1169 Type 2 diabetes mellitus with other specified complication: Secondary | ICD-10-CM | POA: Diagnosis not present

## 2022-09-21 DIAGNOSIS — I152 Hypertension secondary to endocrine disorders: Secondary | ICD-10-CM | POA: Diagnosis not present

## 2022-09-21 DIAGNOSIS — Z7984 Long term (current) use of oral hypoglycemic drugs: Secondary | ICD-10-CM

## 2022-09-21 DIAGNOSIS — F40242 Fear of bridges: Secondary | ICD-10-CM

## 2022-09-21 DIAGNOSIS — E1129 Type 2 diabetes mellitus with other diabetic kidney complication: Secondary | ICD-10-CM | POA: Diagnosis not present

## 2022-09-21 DIAGNOSIS — F418 Other specified anxiety disorders: Secondary | ICD-10-CM

## 2022-09-21 DIAGNOSIS — E1159 Type 2 diabetes mellitus with other circulatory complications: Secondary | ICD-10-CM | POA: Diagnosis not present

## 2022-09-21 DIAGNOSIS — E785 Hyperlipidemia, unspecified: Secondary | ICD-10-CM

## 2022-09-21 LAB — BAYER DCA HB A1C WAIVED: HB A1C (BAYER DCA - WAIVED): 6.7 % — ABNORMAL HIGH (ref 4.8–5.6)

## 2022-09-21 MED ORDER — SERTRALINE HCL 100 MG PO TABS: 100.0000 mg | ORAL_TABLET | Freq: Every day | ORAL | 3 refills | Status: DC

## 2022-09-21 MED ORDER — DAPAGLIFLOZIN PROPANEDIOL 5 MG PO TABS: 5.0000 mg | ORAL_TABLET | Freq: Every day | ORAL | 3 refills | Status: DC

## 2022-09-21 MED ORDER — METFORMIN HCL 1000 MG PO TABS: 1000.0000 mg | ORAL_TABLET | Freq: Two times a day (BID) | ORAL | 3 refills | Status: DC

## 2022-09-21 NOTE — Progress Notes (Signed)
Subjective: CC:DM PCP: Raliegh Ip, DO WUJ:[id number] Adrienne Davidson is a 67 y.o. female presenting to clinic today for:  1. Type 2 Diabetes with hypertension, hyperlipidemia:  Patient reports that she has been doing really well.  She is tolerating her medications without difficulty.  She denies any hypoglycemic episodes.  Rarely nauseated but certainly no vomiting or abdominal pain reported.  She has an eye exam scheduled June 10.  No vaginal or urinary symptoms with Marcelline Deist, which was added for A1c of 8.0 last visit  Last eye exam: 10/01/2022 Last foot exam: UTD Last A1c:  Lab Results  Component Value Date   HGBA1C 6.7 (H) 09/21/2022   Nephropathy screen indicated?: needs Last flu, zoster and/or pneumovax:  Immunization History  Administered Date(s) Administered   Fluad Quad(high Dose 65+) 01/22/2019, 01/22/2020, 02/27/2021, 03/05/2022   Influenza Split 02/06/2017   Influenza,inj,Quad PF,6+ Mos 12/20/2017   Moderna Covid-19 Vaccine Bivalent Booster 87yrs & up 02/13/2021   Moderna Sars-Covid-2 Vaccination 05/20/2019, 06/17/2019, 03/02/2020   Pneumococcal Conjugate-13 10/27/2018   Pneumococcal Polysaccharide-23 12/20/2015   Tdap 06/27/2021   Zoster Recombinat (Shingrix) 10/27/2018, 01/01/2019    ROS: No chest pain, shortness of breath, edema reported today.  ROS: Per HPI  Allergies  Allergen Reactions   Enalapril Anaphylaxis    Angioedema Aug 2018   Past Medical History:  Diagnosis Date   Allergic rhinitis    Allergy    Anxiety    Arthritis    Diabetes mellitus (HCC)    Hyperlipidemia    Hypertension    Psoriasis    Bilateral feet   Sleep apnea    Sleep apnea    Vitamin D deficiency     Current Outpatient Medications:    amLODipine (NORVASC) 5 MG tablet, Take 1 tablet (5 mg total) by mouth daily., Disp: 90 tablet, Rfl: 3   Coenzyme Q10 100 MG capsule, Take by mouth., Disp: , Rfl:    Cranberry 250 MG CAPS, Take by mouth., Disp: , Rfl:    Dulaglutide  (TRULICITY) 3 MG/0.5ML SOPN, Inject 3 mg as directed once a week., Disp: 2 mL, Rfl: 12   EPINEPHrine 0.3 mg/0.3 mL IJ SOAJ injection, Inject 0.3 mg into the muscle as needed for anaphylaxis. Then call 911, Disp: 2 each, Rfl: 0   glucose blood test strip, Test blood sugar once daily (one touch verio if covered by ins/ otherwise whatever ins preference is) E11.65, Disp: 100 each, Rfl: 12   Krill Oil 500 MG CAPS, Take by mouth., Disp: , Rfl:    Lancets (ONETOUCH ULTRASOFT) lancets, Use once daily as instructed E11.65 (one touch verio), Disp: 100 each, Rfl: 12   LORazepam (ATIVAN) 0.5 MG tablet, Take 0.5-1 tablets (0.25-0.5 mg total) by mouth 2 (two) times daily as needed for anxiety., Disp: 10 tablet, Rfl: 0   Multiple Vitamins-Minerals (CENTRUM ADULTS) TABS, Take by mouth., Disp: , Rfl:    rosuvastatin (CRESTOR) 10 MG tablet, Take 1 tablet (10 mg total) by mouth daily., Disp: 90 tablet, Rfl: 3   dapagliflozin propanediol (FARXIGA) 5 MG TABS tablet, Take 1 tablet (5 mg total) by mouth daily., Disp: 90 tablet, Rfl: 3   metFORMIN (GLUCOPHAGE) 1000 MG tablet, Take 1 tablet (1,000 mg total) by mouth 2 (two) times daily with a meal., Disp: 180 tablet, Rfl: 3   sertraline (ZOLOFT) 100 MG tablet, Take 1 tablet (100 mg total) by mouth daily., Disp: 90 tablet, Rfl: 3 Social History   Socioeconomic History   Marital status: Married  Spouse name: Adrienne Davidson   Number of children: 2   Years of education: 12   Highest education level: Associate degree: occupational, Scientist, product/process development, or vocational program  Occupational History   Occupation: paralegal    Comment: retired  Tobacco Use   Smoking status: Former    Packs/day: 0.25    Years: 25.00    Additional pack years: 0.00    Total pack years: 6.25    Types: Cigarettes    Quit date: 06/27/2018    Years since quitting: 4.2   Smokeless tobacco: Never  Vaping Use   Vaping Use: Never used  Substance and Sexual Activity   Alcohol use: Not Currently    Comment: wine  once per year   Drug use: Not Currently   Sexual activity: Not Currently    Birth control/protection: None  Other Topics Concern   Not on file  Social History Narrative   Not on file   Social Determinants of Health   Financial Resource Strain: Low Risk  (09/17/2022)   Overall Financial Resource Strain (CARDIA)    Difficulty of Paying Living Expenses: Not very hard  Food Insecurity: No Food Insecurity (09/17/2022)   Hunger Vital Sign    Worried About Running Out of Food in the Last Year: Never true    Ran Out of Food in the Last Year: Never true  Transportation Needs: No Transportation Needs (09/17/2022)   PRAPARE - Administrator, Civil Service (Medical): No    Lack of Transportation (Non-Medical): No  Physical Activity: Insufficiently Active (09/17/2022)   Exercise Vital Sign    Days of Exercise per Week: 3 days    Minutes of Exercise per Session: 40 min  Stress: No Stress Concern Present (09/17/2022)   [employer] of Occupational Health - Occupational Stress Questionnaire    Feeling of Stress : Only a little  Social Connections: Moderately Isolated (09/17/2022)   Social Connection and Isolation Panel [NHANES]    Frequency of Communication with Friends and Family: Three times a week    Frequency of Social Gatherings with Friends and Family: Once a week    Attends Religious Services: Never    Database administrator or Organizations: No    Attends Banker Meetings: Never    Marital Status: Married  Catering manager Violence: Not At Risk (07/19/2022)   Humiliation, Afraid, Rape, and Kick questionnaire    Fear of Current or Ex-Partner: No    Emotionally Abused: No    Physically Abused: No    Sexually Abused: No   Family History  Problem Relation Age of Onset   Non-Hodgkin's lymphoma Mother    Arthritis Mother    Cancer Mother    Heart disease Father    Arthritis Father    COPD Father    Hypertension Father    Diabetes Daughter    Rheum  arthritis Daughter    Polycystic ovary syndrome Daughter    Breast cancer Neg Hx     Objective: Office vital signs reviewed. BP 131/68   Pulse 71   Temp 98.6 F (37 C)   Ht 5\' 2"  (1.575 m)   Wt 162 lb 6.4 oz (73.7 kg)   SpO2 98%   BMI 29.70 kg/m   Physical Examination:  General: Awake, alert, well nourished, No acute distress HEENT: sclera white, MMM Cardio: regular rate and rhythm, S1S2 heard, no murmurs appreciated Pulm: clear to auscultation bilaterally, no wheezes, rhonchi or rales; normal work of breathing on room air  Assessment/ Plan: 67 y.o. female   Type 2 diabetes mellitus with microalbuminuria, without long-term current use of insulin (HCC) - Plan: Bayer DCA Hb A1c Waived, metFORMIN (GLUCOPHAGE) 1000 MG tablet, dapagliflozin propanediol (FARXIGA) 5 MG TABS tablet  Hypertension associated with diabetes (HCC)  Hyperlipidemia associated with type 2 diabetes mellitus (HCC)  Fear of bridges - Plan: sertraline (ZOLOFT) 100 MG tablet  Situational anxiety - Plan: sertraline (ZOLOFT) 100 MG tablet  Sugar now at goal with A1c down to 6.7.  Continue current regimen.  Marcelline Deist renewed.  If she desires I can combine this with metformin going forward.  Blood pressure well-controlled.  No changes  Continue current dose of statin.  Not due for fasting lipid  Zoloft was due for renewal but did not talk about this issue in detail today  Orders Placed This Encounter  Procedures   Bayer DCA Hb A1c Waived   Meds ordered this encounter  Medications   metFORMIN (GLUCOPHAGE) 1000 MG tablet    Sig: Take 1 tablet (1,000 mg total) by mouth 2 (two) times daily with a meal.    Dispense:  180 tablet    Refill:  3   sertraline (ZOLOFT) 100 MG tablet    Sig: Take 1 tablet (100 mg total) by mouth daily.    Dispense:  90 tablet    Refill:  3   dapagliflozin propanediol (FARXIGA) 5 MG TABS tablet    Sig: Take 1 tablet (5 mg total) by mouth daily.    Dispense:  90 tablet    Refill:   3     Adrienne Luhmann Hulen Skains, DO [facility] [phone]

## 2022-10-10 ENCOUNTER — Other Ambulatory Visit: Payer: Self-pay | Admitting: Family Medicine

## 2022-10-10 DIAGNOSIS — E1159 Type 2 diabetes mellitus with other circulatory complications: Secondary | ICD-10-CM

## 2022-11-08 ENCOUNTER — Encounter: Payer: Self-pay | Admitting: Orthopaedic Surgery

## 2022-11-08 ENCOUNTER — Ambulatory Visit (INDEPENDENT_AMBULATORY_CARE_PROVIDER_SITE_OTHER): Payer: Medicare Other | Admitting: Orthopaedic Surgery

## 2022-11-08 VITALS — Ht 63.0 in | Wt 160.0 lb

## 2022-11-08 DIAGNOSIS — M5136 Other intervertebral disc degeneration, lumbar region: Secondary | ICD-10-CM | POA: Diagnosis not present

## 2022-11-08 DIAGNOSIS — M51369 Other intervertebral disc degeneration, lumbar region without mention of lumbar back pain or lower extremity pain: Secondary | ICD-10-CM

## 2022-11-12 NOTE — Progress Notes (Signed)
Office Visit Note   Patient: Adrienne Davidson           Date of Birth: 1956-04-01           MRN: [id number] Visit Date: 11/08/2022              Requested by: Raliegh Ip, DO [address] [place],  [place] [address] PCP: Raliegh Ip, DO   Assessment & Plan: Visit Diagnoses:  1. Other intervertebral disc degeneration, lumbar region     Plan: Patient with persistent symptoms.  She has anterolisthesis and some symptoms consistent with claudication.  Will proceed with MRI scan [facility] follow-up after scan for review.  She has failed medication rest, home exercise program, completed physical therapy with persistent symptoms.  Follow-Up Instructions: No follow-ups on file.   Orders:  Orders Placed This Encounter  Procedures   MR Lumbar Spine w/o contrast   No orders of the defined types were placed in this encounter.     Procedures: No procedures performed   Clinical Data: No additional findings.   Subjective: Chief Complaint  Patient presents with   Lower Back - Pain    HPI 67 year old female with ongoing problems with back pain she is completed physical therapy doing yoga.  She is ordered a senior treadmill.  She thinks she has gotten slight improvement with the therapy but has ongoing issues and does have anterolisthesis at L4-5.  Pain with standing as well as walking she does better leaning on a grocery cart.  She tends to walk around holding her lower back frequently. Previous x-rays showed some anterolisthesis at L3-4 and also L4-5. Review of Systems updated unchanged from 05/10/2022.  Of note is the patient history of diabetes hypertension sleep apnea.   Objective: Vital Signs: Ht 5\' 3"  (1.6 m)   Wt 160 lb (72.6 kg)   BMI 28.34 kg/m   Physical Exam HENT:     Head: Normocephalic.     Right Ear: External ear normal.     Left Ear: External ear normal.     Nose: Nose normal.  Eyes:     Extraocular Movements: Extraocular movements  intact.  Pulmonary:     Effort: Pulmonary effort is normal.  Abdominal:     Palpations: Abdomen is soft.  Skin:    Capillary Refill: Capillary refill takes less than 2 seconds.     Coloration: Skin is not jaundiced.  Neurological:     Mental Status: She is alert.  Psychiatric:        Mood and Affect: Mood normal.     Ortho Exam patient is amatory knee and ankle jerk are intact negative logroll hips knees reach full extension.  Minimal trochanteric bursal tenderness.  Specialty Comments:  No specialty comments available.  Imaging: No results found.   PMFS History: Patient Active Problem List   Diagnosis Date Noted   Stress due to illness of family member 03/05/2022   Hyperlipidemia associated with type 2 diabetes mellitus (HCC) 03/05/2022   History of angioedema 03/05/2022   Other intervertebral disc degeneration, lumbar region 11/20/2021   Osteopenia after menopause 06/27/2021   Adverse reaction to ACE inhibitor drug 12/19/2015   Angioedema 12/19/2015   Diabetes mellitus (HCC) 12/19/2015   Hypertension associated with diabetes (HCC) 12/19/2015   OSA on CPAP 12/19/2015   Past Medical History:  Diagnosis Date   Allergic rhinitis    Allergy    Anxiety    Arthritis    Diabetes mellitus (  HCC)    Hyperlipidemia    Hypertension    Psoriasis    Bilateral feet   Sleep apnea    Sleep apnea    Vitamin D deficiency     Family History  Problem Relation Age of Onset   Non-Hodgkin's lymphoma Mother    Arthritis Mother    Cancer Mother    Heart disease Father    Arthritis Father    COPD Father    Hypertension Father    Diabetes Daughter    Rheum arthritis Daughter    Polycystic ovary syndrome Daughter    Breast cancer Neg Hx     Past Surgical History:  Procedure Laterality Date   APPENDECTOMY     CESAREAN SECTION  1980   CESAREAN SECTION  1984   TONSILLECTOMY     Social History   Occupational History   Occupation: IT consultant    Comment: retired  Tobacco  Use   Smoking status: Former    Current packs/day: 0.00    Average packs/day: 0.3 packs/day for 25.0 years (6.3 ttl pk-yrs)    Types: Cigarettes    Start date: 06/26/1993    Quit date: 06/27/2018    Years since quitting: 4.3   Smokeless tobacco: Never  Vaping Use   Vaping status: Never Used  Substance and Sexual Activity   Alcohol use: Not Currently    Comment: wine once per year   Drug use: Not Currently   Sexual activity: Not Currently    Birth control/protection: None

## 2022-12-03 ENCOUNTER — Ambulatory Visit (HOSPITAL_COMMUNITY)
Admission: RE | Admit: 2022-12-03 | Discharge: 2022-12-03 | Disposition: A | Discharge status: Home / Self Care | Payer: Medicare Other | Source: Ambulatory Visit | Attending: Orthopaedic Surgery | Admitting: Orthopaedic Surgery

## 2022-12-03 DIAGNOSIS — M5136 Other intervertebral disc degeneration, lumbar region: Secondary | ICD-10-CM | POA: Insufficient documentation

## 2022-12-03 DIAGNOSIS — M48061 Spinal stenosis, lumbar region without neurogenic claudication: Secondary | ICD-10-CM | POA: Diagnosis not present

## 2022-12-03 DIAGNOSIS — M47816 Spondylosis without myelopathy or radiculopathy, lumbar region: Secondary | ICD-10-CM | POA: Diagnosis not present

## 2022-12-03 DIAGNOSIS — M4807 Spinal stenosis, lumbosacral region: Secondary | ICD-10-CM | POA: Diagnosis not present

## 2022-12-27 DIAGNOSIS — C44722 Squamous cell carcinoma of skin of right lower limb, including hip: Secondary | ICD-10-CM | POA: Diagnosis not present

## 2023-01-01 ENCOUNTER — Telehealth: Payer: Self-pay | Admitting: Family Medicine

## 2023-01-18 ENCOUNTER — Ambulatory Visit (INDEPENDENT_AMBULATORY_CARE_PROVIDER_SITE_OTHER): Payer: Medicare Other | Admitting: Family Medicine

## 2023-01-18 ENCOUNTER — Encounter: Payer: Self-pay | Admitting: Family Medicine

## 2023-01-18 VITALS — BP 112/63 | HR 74 | Temp 98.6°F | Ht 63.0 in | Wt 163.0 lb

## 2023-01-18 DIAGNOSIS — E1129 Type 2 diabetes mellitus with other diabetic kidney complication: Secondary | ICD-10-CM

## 2023-01-18 DIAGNOSIS — E1159 Type 2 diabetes mellitus with other circulatory complications: Secondary | ICD-10-CM

## 2023-01-18 DIAGNOSIS — E785 Hyperlipidemia, unspecified: Secondary | ICD-10-CM

## 2023-01-18 DIAGNOSIS — Z79899 Other long term (current) drug therapy: Secondary | ICD-10-CM | POA: Diagnosis not present

## 2023-01-18 DIAGNOSIS — E1169 Type 2 diabetes mellitus with other specified complication: Secondary | ICD-10-CM

## 2023-01-18 DIAGNOSIS — F418 Other specified anxiety disorders: Secondary | ICD-10-CM

## 2023-01-18 DIAGNOSIS — F40242 Fear of bridges: Secondary | ICD-10-CM

## 2023-01-18 DIAGNOSIS — Z7985 Long-term (current) use of injectable non-insulin antidiabetic drugs: Secondary | ICD-10-CM | POA: Diagnosis not present

## 2023-01-18 DIAGNOSIS — Z23 Encounter for immunization: Secondary | ICD-10-CM | POA: Diagnosis not present

## 2023-01-18 DIAGNOSIS — R809 Proteinuria, unspecified: Secondary | ICD-10-CM | POA: Diagnosis not present

## 2023-01-18 DIAGNOSIS — I152 Hypertension secondary to endocrine disorders: Secondary | ICD-10-CM

## 2023-01-18 LAB — BAYER DCA HB A1C WAIVED: HB A1C (BAYER DCA - WAIVED): 6.4 % — ABNORMAL HIGH (ref 4.8–5.6)

## 2023-01-18 MED ORDER — TRULICITY 3 MG/0.5ML ~~LOC~~ SOAJ: 3.0000 mg | SUBCUTANEOUS | 3 refills | Status: DC

## 2023-01-18 MED ORDER — ROSUVASTATIN CALCIUM 10 MG PO TABS: 10.0000 mg | ORAL_TABLET | Freq: Every day | ORAL | 3 refills | Status: DC

## 2023-01-18 MED ORDER — SERTRALINE HCL 100 MG PO TABS: 100.0000 mg | ORAL_TABLET | Freq: Every day | ORAL | 3 refills | Status: DC

## 2023-01-18 MED ORDER — LORAZEPAM 0.5 MG PO TABS: 0.2500 mg | ORAL_TABLET | Freq: Two times a day (BID) | ORAL | 0 refills | Status: AC | PRN

## 2023-01-18 MED ORDER — METFORMIN HCL 1000 MG PO TABS: 1000.0000 mg | ORAL_TABLET | Freq: Two times a day (BID) | ORAL | 3 refills | Status: DC

## 2023-01-18 MED ORDER — AMLODIPINE BESYLATE 5 MG PO TABS: 5.0000 mg | ORAL_TABLET | Freq: Every day | ORAL | 3 refills | Status: DC

## 2023-01-18 NOTE — Progress Notes (Signed)
Subjective: CC:DM PCP: Raliegh Ip, DO KGU:[id number] Adrienne Davidson is a 67 y.o. female presenting to clinic today for:  1. Type 2 Diabetes with hypertension, hyperlipidemia:  She reports compliance with Trulicity, Farxiga, Norvasc, metformin, rosuvastatin.  She notes that she is no longer a candidate for Trulicity patient assistance program and wants me to send over Trulicity for her.  She reports no GI side effects, vaginitis etc.  No chest pain, no shortness of breath  Diabetes Health Maintenance Due  Topic Date Due   FOOT EXAM  03/06/2023   HEMOGLOBIN A1C  03/23/2023   OPHTHALMOLOGY EXAM  10/01/2023    Last A1c:  Lab Results  Component Value Date   HGBA1C 6.7 (H) 09/21/2022   2.  Panic attacks Patient reported very sparing use of Ativan in fact her last prescription was a couple of years ago.  She believes her current Rx to be out of date.  No reports of excessive daytime sedation, falls, respiratory pression or mental status changes  ROS: Per HPI  Allergies  Allergen Reactions   Enalapril Anaphylaxis    Angioedema Aug 2018   Past Medical History:  Diagnosis Date   Allergic rhinitis    Allergy    Anxiety    Arthritis    Diabetes mellitus (HCC)    Hyperlipidemia    Hypertension    Psoriasis    Bilateral feet   Sleep apnea    Sleep apnea    Vitamin D deficiency     Current Outpatient Medications:    amLODipine (NORVASC) 5 MG tablet, Take 1 tablet (5 mg total) by mouth daily., Disp: 90 tablet, Rfl: 0   Coenzyme Q10 100 MG capsule, Take by mouth., Disp: , Rfl:    Cranberry 250 MG CAPS, Take by mouth., Disp: , Rfl:    dapagliflozin propanediol (FARXIGA) 5 MG TABS tablet, Take 1 tablet (5 mg total) by mouth daily., Disp: 90 tablet, Rfl: 3   Dulaglutide (TRULICITY) 3 MG/0.5ML SOPN, Inject 3 mg as directed once a week., Disp: 2 mL, Rfl: 12   EPINEPHrine 0.3 mg/0.3 mL IJ SOAJ injection, Inject 0.3 mg into the muscle as needed for anaphylaxis. Then call 911, Disp: 2  each, Rfl: 0   glucose blood test strip, Test blood sugar once daily (one touch verio if covered by ins/ otherwise whatever ins preference is) E11.65, Disp: 100 each, Rfl: 12   Krill Oil 500 MG CAPS, Take by mouth., Disp: , Rfl:    Lancets (ONETOUCH ULTRASOFT) lancets, Use once daily as instructed E11.65 (one touch verio), Disp: 100 each, Rfl: 12   LORazepam (ATIVAN) 0.5 MG tablet, Take 0.5-1 tablets (0.25-0.5 mg total) by mouth 2 (two) times daily as needed for anxiety., Disp: 10 tablet, Rfl: 0   metFORMIN (GLUCOPHAGE) 1000 MG tablet, Take 1 tablet (1,000 mg total) by mouth 2 (two) times daily with a meal., Disp: 180 tablet, Rfl: 3   Multiple Vitamins-Minerals (CENTRUM ADULTS) TABS, Take by mouth., Disp: , Rfl:    rosuvastatin (CRESTOR) 10 MG tablet, Take 1 tablet (10 mg total) by mouth daily., Disp: 90 tablet, Rfl: 3   sertraline (ZOLOFT) 100 MG tablet, Take 1 tablet (100 mg total) by mouth daily., Disp: 90 tablet, Rfl: 3 Social History   Socioeconomic History   Marital status: Married    Spouse name: Aurther Loft   Number of children: 2   Years of education: 12   Highest education level: Associate degree: occupational, Scientist, product/process development, or vocational program  Occupational History  Occupation: IT consultant    Comment: retired  Tobacco Use   Smoking status: Former    Current packs/day: 0.00    Average packs/day: 0.3 packs/day for 25.0 years (6.3 ttl pk-yrs)    Types: Cigarettes    Start date: 06/26/1993    Quit date: 06/27/2018    Years since quitting: 4.5   Smokeless tobacco: Never  Vaping Use   Vaping status: Never Used  Substance and Sexual Activity   Alcohol use: Not Currently    Comment: wine once per year   Drug use: Not Currently   Sexual activity: Not Currently    Birth control/protection: None  Other Topics Concern   Not on file  Social History Narrative   Not on file   Social Determinants of Health   Financial Resource Strain: Low Risk  (09/17/2022)   Overall Financial Resource  Strain (CARDIA)    Difficulty of Paying Living Expenses: Not very hard  Food Insecurity: No Food Insecurity (09/17/2022)   Hunger Vital Sign    Worried About Running Out of Food in the Last Year: Never true    Ran Out of Food in the Last Year: Never true  Transportation Needs: No Transportation Needs (09/17/2022)   PRAPARE - Administrator, Civil Service (Medical): No    Lack of Transportation (Non-Medical): No  Physical Activity: Insufficiently Active (09/17/2022)   Exercise Vital Sign    Days of Exercise per Week: 3 days    Minutes of Exercise per Session: 40 min  Stress: No Stress Concern Present (09/17/2022)   [employer] of Occupational Health - Occupational Stress Questionnaire    Feeling of Stress : Only a little  Social Connections: Moderately Isolated (09/17/2022)   Social Connection and Isolation Panel [NHANES]    Frequency of Communication with Friends and Family: Three times a week    Frequency of Social Gatherings with Friends and Family: Once a week    Attends Religious Services: Never    Database administrator or Organizations: No    Attends Banker Meetings: Never    Marital Status: Married  Catering manager Violence: Not At Risk (07/19/2022)   Humiliation, Afraid, Rape, and Kick questionnaire    Fear of Current or Ex-Partner: No    Emotionally Abused: No    Physically Abused: No    Sexually Abused: No   Family History  Problem Relation Age of Onset   Non-Hodgkin's lymphoma Mother    Arthritis Mother    Cancer Mother    Heart disease Father    Arthritis Father    COPD Father    Hypertension Father    Diabetes Daughter    Rheum arthritis Daughter    Polycystic ovary syndrome Daughter    Breast cancer Neg Hx     Objective: Office vital signs reviewed. BP 112/63   Pulse 74   Temp 98.6 F (37 C)   Ht 5\' 3"  (1.6 m)   Wt 163 lb (73.9 kg)   SpO2 97%   BMI 28.87 kg/m   Physical Examination:  General: Awake, alert, well  nourished, No acute distress HEENT: sclera white, MMM Cardio: regular rate and rhythm, S1S2 heard, no murmurs appreciated Pulm: clear to auscultation bilaterally, no wheezes, rhonchi or rales; normal work of breathing on room air Psych: mood stable, speech normal, affect appropriate     01/18/2023    9:08 AM 09/21/2022    9:24 AM 07/19/2022   10:40 AM  Depression screen PHQ 2/9  Decreased Interest 0 0 0  Down, Depressed, Hopeless 0 0 0  PHQ - 2 Score 0 0 0  Altered sleeping 0 0   Tired, decreased energy 0 1   Change in appetite 0 0   Feeling bad or failure about yourself  0 0   Trouble concentrating 0 0   Moving slowly or fidgety/restless 0 0   Suicidal thoughts 0 0   PHQ-9 Score 0 1   Difficult doing work/chores Not difficult at all Not difficult at all       01/18/2023    9:09 AM 09/21/2022    9:24 AM 03/05/2022    9:44 AM 10/30/2021   10:49 AM  GAD 7 : Generalized Anxiety Score  Nervous, Anxious, on Edge 0 0 0 1  Control/stop worrying 0 0 0 0  Worry too much - different things 0 0 0 1  Trouble relaxing 0 0 0 1  Restless 0 0 0 0  Easily annoyed or irritable 0 0 1 1  Afraid - awful might happen 0 0 0 0  Total GAD 7 Score 0 0 1 4  Anxiety Difficulty  Not difficult at all Not difficult at all Somewhat difficult     Assessment/ Plan: 67 y.o. female   Type 2 diabetes mellitus with microalbuminuria, without long-term current use of insulin (HCC) - Plan: Bayer DCA Hb A1c Waived, Microalbumin / creatinine urine ratio, Basic Metabolic Panel, Dulaglutide (TRULICITY) 3 MG/0.5ML SOPN, metFORMIN (GLUCOPHAGE) 1000 MG tablet  Hypertension associated with diabetes (HCC) - Plan: Basic Metabolic Panel, amLODipine (NORVASC) 5 MG tablet  Hyperlipidemia associated with type 2 diabetes mellitus (HCC) - Plan: rosuvastatin (CRESTOR) 10 MG tablet  Long-term current use of injectable noninsulin antidiabetic medication  Controlled substance agreement signed - Plan: ToxASSURE Select 13 (MW),  Urine  Fear of bridges - Plan: LORazepam (ATIVAN) 0.5 MG tablet, sertraline (ZOLOFT) 100 MG tablet  Situational anxiety - Plan: LORazepam (ATIVAN) 0.5 MG tablet, sertraline (ZOLOFT) 100 MG tablet   Sugar remains under excellent control with A1c of 6.4 today.  I have sent over Trulicity to her pharmacy.  We can maybe consider trying to get her qualified for Ozempic patient assistance if she is not able to afford Trulicity  Blood pressure is controlled.  Not yet due for fasting lipid.  Norvasc and Crestor renewed today  UDS and CSA were updated as per office policy.  Ativan and Zoloft renewed.  National narcotic database reviewed and there were no red flags.  Influenza vaccination administered today.  She may follow-up in the next 6 months, sooner if concerns arise  Raliegh Ip, DO [facility] [phone]

## 2023-01-19 LAB — BASIC METABOLIC PANEL
BUN/Creatinine Ratio: 21 (ref 12–28)
BUN: 24 mg/dL (ref 8–27)
CO2: 21 mmol/L (ref 20–29)
Calcium: 10.1 mg/dL (ref 8.7–10.3)
Chloride: 103 mmol/L (ref 96–106)
Creatinine, Ser: 1.13 mg/dL — ABNORMAL HIGH (ref 0.57–1.00)
Glucose: 113 mg/dL — ABNORMAL HIGH (ref 70–99)
Potassium: 4.4 mmol/L (ref 3.5–5.2)
Sodium: 142 mmol/L (ref 134–144)
eGFR: 53 mL/min/{1.73_m2} — ABNORMAL LOW (ref >59)

## 2023-01-19 LAB — MICROALBUMIN / CREATININE URINE RATIO
Creatinine, Urine: 94.9 mg/dL
Microalb/Creat Ratio: 74 mg/g{creat} — ABNORMAL HIGH (ref 0–29)
Microalbumin, Urine: 69.9 ug/mL

## 2023-01-21 LAB — TOXASSURE SELECT 13 (MW), URINE

## 2023-01-31 ENCOUNTER — Encounter: Payer: Self-pay | Admitting: Family

## 2023-01-31 ENCOUNTER — Ambulatory Visit: Payer: Medicare Other | Admitting: Family

## 2023-01-31 VITALS — BP 151/77 | HR 85 | Temp 97.8°F | Ht 63.0 in | Wt 164.4 lb

## 2023-01-31 DIAGNOSIS — R051 Acute cough: Secondary | ICD-10-CM

## 2023-01-31 DIAGNOSIS — H66002 Acute suppurative otitis media without spontaneous rupture of ear drum, left ear: Secondary | ICD-10-CM | POA: Diagnosis not present

## 2023-01-31 MED ORDER — AMOXICILLIN-POT CLAVULANATE 875-125 MG PO TABS: 1.0000 | ORAL_TABLET | Freq: Two times a day (BID) | ORAL | 0 refills | Status: DC

## 2023-01-31 NOTE — Progress Notes (Signed)
Subjective:    Patient ID: Adrienne Davidson, female    DOB: 1955-12-06, 67 y.o.   MRN: [id number]  Chief Complaint  Patient presents with   Ear Pain    Congestion in chest since last Friday    PT presents to the office today with cough, chest congestion, and ear pain that started over a week ago.  Sinusitis This is a new problem. The current episode started 1 to 4 weeks ago. The problem has been gradually worsening since onset. There has been no fever. Her pain is at a severity of 5/10. The pain is mild. Associated symptoms include congestion, coughing, ear pain, a hoarse voice, shortness of breath, sinus pressure, sneezing and a sore throat. Pertinent negatives include no headaches. Past treatments include acetaminophen, oral decongestants and nasal decongestants. The treatment provided moderate relief.      Review of Systems  HENT:  Positive for congestion, ear pain, hoarse voice, sinus pressure, sneezing and sore throat.   Respiratory:  Positive for cough and shortness of breath.   Neurological:  Negative for headaches.  All other systems reviewed and are negative.      Objective:   Physical Exam Vitals reviewed.  Constitutional:      General: She is not in acute distress.    Appearance: She is well-developed.  HENT:     Head: Normocephalic and atraumatic.     Right Ear: Tympanic membrane normal.     Left Ear: Tympanic membrane normal.  Eyes:     Pupils: Pupils are equal, round, and reactive to light.  Neck:     Thyroid: No thyromegaly.  Cardiovascular:     Rate and Rhythm: Normal rate and regular rhythm.     Heart sounds: Normal heart sounds. No murmur heard. Pulmonary:     Effort: Pulmonary effort is normal. No respiratory distress.     Breath sounds: Normal breath sounds. No wheezing.  Abdominal:     General: Bowel sounds are normal. There is no distension.     Palpations: Abdomen is soft.     Tenderness: There is no abdominal tenderness.  Musculoskeletal:         General: No tenderness. Normal range of motion.     Cervical back: Normal range of motion and neck supple.  Skin:    General: Skin is warm and dry.  Neurological:     Mental Status: She is alert and oriented to person, place, and time.     Cranial Nerves: No cranial nerve deficit.     Deep Tendon Reflexes: Reflexes are normal and symmetric.  Psychiatric:        Behavior: Behavior normal.        Thought Content: Thought content normal.        Judgment: Judgment normal.      BP (!) 151/77   Pulse 85   Temp 97.8 F (36.6 C) (Temporal)   Ht 5\' 3"  (1.6 m)   Wt 164 lb 6.4 oz (74.6 kg)   SpO2 97%   BMI 29.12 kg/m       Assessment & Plan:   Adrienne Davidson comes in today with chief complaint of Ear Pain (Congestion in chest since last Friday )   Diagnosis and orders addressed:  1. Acute cough - Novel Coronavirus, NAA ([facility]) - amoxicillin-clavulanate (AUGMENTIN) 875-125 MG tablet; Take 1 tablet by mouth 2 (two) times daily.  Dispense: 14 tablet; Refill: 0  2. Non-recurrent acute suppurative otitis media of left ear without spontaneous  rupture of tympanic membrane - Take meds as prescribed - Use a cool mist humidifier  -Use saline nose sprays frequently -Force fluids -For any cough or congestion  Use plain Mucinex- regular strength or max strength is fine -For fever or aces or pains- take tylenol or ibuprofen. -Throat lozenges if help -Follow up if symptoms worsen or do not improve  - amoxicillin-clavulanate (AUGMENTIN) 875-125 MG tablet; Take 1 tablet by mouth 2 (two) times daily.  Dispense: 14 tablet; Refill: 0   Jannifer Rodney, FNP

## 2023-01-31 NOTE — Patient Instructions (Signed)

## 2023-02-01 LAB — NOVEL CORONAVIRUS, NAA: SARS-CoV-2, NAA: NOT DETECTED

## 2023-02-22 ENCOUNTER — Encounter: Payer: Self-pay | Admitting: Urology

## 2023-02-22 ENCOUNTER — Ambulatory Visit: Payer: Medicare Other | Admitting: Urology

## 2023-02-22 VITALS — BP 166/75 | HR 74

## 2023-02-22 DIAGNOSIS — N200 Calculus of kidney: Secondary | ICD-10-CM | POA: Diagnosis not present

## 2023-02-22 NOTE — Patient Instructions (Signed)

## 2023-02-22 NOTE — Progress Notes (Unsigned)
02/22/2023 11:55 AM   Norwood Levo Apr 28, 1955 [id number]  Referring provider: Raliegh Ip, DO [address] [place],  [place] [address]  nephrolithiasis   HPI: Ms Latella is a [phone] here for evaluation of nephrolithiasis. She has known large renal calculi bilaterally with last image in July 2023. No flank pain currently. No hematuria. No history of recurrent UTI. She has never passed a stone.    PMH: Past Medical History:  Diagnosis Date   Allergic rhinitis    Allergy    Anxiety    Arthritis    Diabetes mellitus (HCC)    Hyperlipidemia    Hypertension    Psoriasis    Bilateral feet   Sleep apnea    Sleep apnea    Vitamin D deficiency     Surgical History: Past Surgical History:  Procedure Laterality Date   APPENDECTOMY     CESAREAN SECTION  1980   CESAREAN SECTION  1984   TONSILLECTOMY      Home Medications:  Allergies as of 02/22/2023       Reactions   Enalapril Anaphylaxis   Angioedema Aug 2018        Medication List        Accurate as of February 22, 2023 11:55 AM. If you have any questions, ask your nurse or doctor.          amLODipine 5 MG tablet Commonly known as: NORVASC Take 1 tablet (5 mg total) by mouth daily.   amoxicillin-clavulanate 875-125 MG tablet Commonly known as: AUGMENTIN Take 1 tablet by mouth 2 (two) times daily.   Centrum Adults Tabs Take by mouth.   Coenzyme Q10 100 MG capsule Take by mouth.   Cranberry 250 MG Caps Take by mouth.   dapagliflozin propanediol 5 MG Tabs tablet Commonly known as: Farxiga Take 1 tablet (5 mg total) by mouth daily.   EPINEPHrine 0.3 mg/0.3 mL Soaj injection Commonly known as: EPI-PEN Inject 0.3 mg into the muscle as needed for anaphylaxis. Then call 911   glucose blood test strip Test blood sugar once daily (one touch verio if covered by ins/ otherwise whatever ins preference is) E11.65   Krill Oil 500 MG Caps Take by mouth.   LORazepam 0.5 MG tablet Commonly known  as: ATIVAN Take 0.5-1 tablets (0.25-0.5 mg total) by mouth 2 (two) times daily as needed for anxiety.   metFORMIN 1000 MG tablet Commonly known as: GLUCOPHAGE Take 1 tablet (1,000 mg total) by mouth 2 (two) times daily with a meal.   onetouch ultrasoft lancets Use once daily as instructed E11.65 (one touch verio)   rosuvastatin 10 MG tablet Commonly known as: CRESTOR Take 1 tablet (10 mg total) by mouth daily.   sertraline 100 MG tablet Commonly known as: ZOLOFT Take 1 tablet (100 mg total) by mouth daily.   Trulicity 3 MG/0.5ML Soaj Generic drug: Dulaglutide Inject 3 mg as directed once a week.        Allergies:  Allergies  Allergen Reactions   Enalapril Anaphylaxis    Angioedema Aug 2018    Family History: Family History  Problem Relation Age of Onset   Non-Hodgkin's lymphoma Mother    Arthritis Mother    Cancer Mother    Heart disease Father    Arthritis Father    COPD Father    Hypertension Father    Diabetes Daughter    Rheum arthritis Daughter    Polycystic ovary syndrome Daughter    Breast cancer Neg Hx  Social History:  reports that she quit smoking about 4 years ago. Her smoking use included cigarettes. She started smoking about 29 years ago. She has a 6.3 pack-year smoking history. She has never used smokeless tobacco. She reports that she does not currently use alcohol. She reports that she does not currently use drugs.  ROS: All other review of systems were reviewed and are negative except what is noted above in HPI  Physical Exam: BP (!) 166/75   Pulse 74   Constitutional:  Alert and oriented, No acute distress. HEENT: [place] AT, moist mucus membranes.  Trachea midline, no masses. Cardiovascular: No clubbing, cyanosis, or edema. Respiratory: Normal respiratory effort, no increased work of breathing. GI: Abdomen is soft, nontender, nondistended, no abdominal masses GU: No CVA tenderness.  Lymph: No cervical or inguinal lymphadenopathy. Skin:  No rashes, bruises or suspicious lesions. Neurologic: Grossly intact, no focal deficits, moving all 4 extremities. Psychiatric: Normal mood and affect.  Laboratory Data: Lab Results  Component Value Date   WBC 6.5 06/13/2022   HGB 11.9 06/13/2022   HCT 35.9 06/13/2022   MCV 86 06/13/2022   PLT 262 06/13/2022    Lab Results  Component Value Date   CREATININE 1.13 (H) 01/18/2023    No results found for: "PSA"  No results found for: "TESTOSTERONE"  Lab Results  Component Value Date   HGBA1C 6.4 (H) 01/18/2023    Urinalysis    Component Value Date/Time   APPEARANCEUR Clear 10/30/2021 1138   GLUCOSEU Negative 10/30/2021 1138   BILIRUBINUR Negative 10/30/2021 1138   PROTEINUR 2+ (A) 10/30/2021 1138   NITRITE Negative 10/30/2021 1138   LEUKOCYTESUR 3+ (A) 10/30/2021 1138    Lab Results  Component Value Date   LABMICR 69.9 01/18/2023   WBCUA >30 (A) 10/30/2021   LABEPIT None seen 10/30/2021   BACTERIA Many (A) 10/30/2021    Pertinent Imaging: KUB 10/2021: IMages reviewed and discussed with the patient  No results found for this or any previous visit.  No results found for this or any previous visit.  No results found for this or any previous visit.  No results found for this or any previous visit.  No results found for this or any previous visit.  No valid procedures specified. No results found for this or any previous visit.  No results found for this or any previous visit.   Assessment & Plan:    1. Kidney stones CT stone study - Urinalysis, Routine w reflex microscopic   No follow-ups on file.  Wilkie Aye, MD  [facility] [place]

## 2023-04-12 ENCOUNTER — Ambulatory Visit (HOSPITAL_COMMUNITY)
Admission: RE | Admit: 2023-04-12 | Discharge: 2023-04-12 | Disposition: A | Discharge status: Home / Self Care | Payer: Medicare Other | Source: Ambulatory Visit | Attending: Urology | Admitting: Urology

## 2023-04-12 ENCOUNTER — Ambulatory Visit: Payer: Medicare Other | Admitting: Urology

## 2023-04-12 DIAGNOSIS — N200 Calculus of kidney: Secondary | ICD-10-CM | POA: Diagnosis not present

## 2023-04-12 DIAGNOSIS — K5730 Diverticulosis of large intestine without perforation or abscess without bleeding: Secondary | ICD-10-CM | POA: Diagnosis not present

## 2023-04-12 NOTE — H&P (View-Only) (Signed)
Name: Adrienne Davidson DOB: 01-Sep-1955 MRN: [id number]  History of Present Illness: Adrienne Davidson is a 67 y.o. female who presents today for follow up visit at [facility] [place]. - GU History: 1. Kidney stones. She has never passed a stone.   At initial visit with Dr. Ronne Binning on 02/22/2023: - "She has known large renal calculi bilaterally with last image in July 2023." - Asymptomatic.  - The plan was CT stone study to evaluate stone burden.  Since last visit: > 04/12/2023: CT stone study showed "Partial staghorn calculus seen in the lower pole collecting system of the right kidney measuring 2.6 cm. Several partial staghorn calculi are seen in the left renal collecting system, largest measuring 3.7 cm. No evidence of ureteral calculi or dilatation. Unremarkable unopacified urinary bladder."  Today: She denies recent stone passage. She reports mild intermittent left flank pain; denies right flank pain or abdominal pain. She denies fevers, nausea, or vomiting.  She denies increased urinary urgency, frequency, nocturia, dysuria, gross hematuria, hesitancy, straining to void, or sensations of incomplete emptying.   Fall Screening: Do you usually have a device to assist in your mobility? No   Medications: Current Outpatient Medications  Medication Sig Dispense Refill   amLODipine (NORVASC) 5 MG tablet Take 1 tablet (5 mg total) by mouth daily. 100 tablet 3   Coenzyme Q10 100 MG capsule Take by mouth.     Cranberry 250 MG CAPS Take by mouth.     dapagliflozin propanediol (FARXIGA) 5 MG TABS tablet Take 1 tablet (5 mg total) by mouth daily. 90 tablet 3   Dulaglutide (TRULICITY) 3 MG/0.5ML SOPN Inject 3 mg as directed once a week. 6 mL 3   EPINEPHrine 0.3 mg/0.3 mL IJ SOAJ injection Inject 0.3 mg into the muscle as needed for anaphylaxis. Then call 911 2 each 0   glucose blood test strip Test blood sugar once daily (one touch verio if covered by ins/ otherwise whatever ins  preference is) E11.65 100 each 12   Krill Oil 500 MG CAPS Take by mouth.     Lancets (ONETOUCH ULTRASOFT) lancets Use once daily as instructed E11.65 (one touch verio) 100 each 12   LORazepam (ATIVAN) 0.5 MG tablet Take 0.5-1 tablets (0.25-0.5 mg total) by mouth 2 (two) times daily as needed for anxiety. 10 tablet 0   metFORMIN (GLUCOPHAGE) 1000 MG tablet Take 1 tablet (1,000 mg total) by mouth 2 (two) times daily with a meal. 200 tablet 3   Multiple Vitamins-Minerals (CENTRUM ADULTS) TABS Take by mouth.     nitrofurantoin, macrocrystal-monohydrate, (MACROBID) 100 MG capsule Take 1 capsule (100 mg total) by mouth 2 (two) times daily for 7 days. 14 capsule 0   rosuvastatin (CRESTOR) 10 MG tablet Take 1 tablet (10 mg total) by mouth daily. 100 tablet 3   sertraline (ZOLOFT) 100 MG tablet Take 1 tablet (100 mg total) by mouth daily. 100 tablet 3   amoxicillin-clavulanate (AUGMENTIN) 875-125 MG tablet Take 1 tablet by mouth 2 (two) times daily. 14 tablet 0   No current facility-administered medications for this visit.    Allergies: Allergies  Allergen Reactions   Enalapril Anaphylaxis    Angioedema Aug 2018    Past Medical History:  Diagnosis Date   Allergic rhinitis    Allergy    Anxiety    Arthritis    Diabetes mellitus (HCC)    Hyperlipidemia    Hypertension    Psoriasis    Bilateral feet   Sleep apnea  Sleep apnea    Vitamin D deficiency    Past Surgical History:  Procedure Laterality Date   APPENDECTOMY     CESAREAN SECTION  1980   CESAREAN SECTION  1984   TONSILLECTOMY     Family History  Problem Relation Age of Onset   Non-Hodgkin's lymphoma Mother    Arthritis Mother    Cancer Mother    Heart disease Father    Arthritis Father    COPD Father    Hypertension Father    Diabetes Daughter    Rheum arthritis Daughter    Polycystic ovary syndrome Daughter    Breast cancer Neg Hx    Social History   Socioeconomic History   Marital status: Married    Spouse  name: Aurther Loft   Number of children: 2   Years of education: 12   Highest education level: Associate degree: occupational, Scientist, product/process development, or vocational program  Occupational History   Occupation: paralegal    Comment: retired  Tobacco Use   Smoking status: Former    Current packs/day: 0.00    Average packs/day: 0.3 packs/day for 25.0 years (6.3 ttl pk-yrs)    Types: Cigarettes    Start date: 06/26/1993    Quit date: 06/27/2018    Years since quitting: 4.8   Smokeless tobacco: Never  Vaping Use   Vaping status: Never Used  Substance and Sexual Activity   Alcohol use: Not Currently    Comment: wine once per year   Drug use: Not Currently   Sexual activity: Not Currently    Birth control/protection: None  Other Topics Concern   Not on file  Social History Narrative   Not on file   Social Drivers of Health   Financial Resource Strain: Low Risk  (09/17/2022)   Overall Financial Resource Strain (CARDIA)    Difficulty of Paying Living Expenses: Not very hard  Food Insecurity: No Food Insecurity (09/17/2022)   Hunger Vital Sign    Worried About Running Out of Food in the Last Year: Never true    Ran Out of Food in the Last Year: Never true  Transportation Needs: No Transportation Needs (09/17/2022)   PRAPARE - Administrator, Civil Service (Medical): No    Lack of Transportation (Non-Medical): No  Physical Activity: Insufficiently Active (09/17/2022)   Exercise Vital Sign    Days of Exercise per Week: 3 days    Minutes of Exercise per Session: 40 min  Stress: No Stress Concern Present (09/17/2022)   [employer] of Occupational Health - Occupational Stress Questionnaire    Feeling of Stress : Only a little  Social Connections: Moderately Isolated (09/17/2022)   Social Connection and Isolation Panel [NHANES]    Frequency of Communication with Friends and Family: Three times a week    Frequency of Social Gatherings with Friends and Family: Once a week    Attends Religious  Services: Never    Database administrator or Organizations: No    Attends Banker Meetings: Never    Marital Status: Married  Catering manager Violence: Not At Risk (07/19/2022)   Humiliation, Afraid, Rape, and Kick questionnaire    Fear of Current or Ex-Partner: No    Emotionally Abused: No    Physically Abused: No    Sexually Abused: No    SUBJECTIVE  Review of Systems Constitutional: Patient denies any unintentional weight loss or change in strength lntegumentary: Patient denies any rashes or pruritus Cardiovascular: Patient denies chest pain or syncope Respiratory:  Patient denies shortness of breath Gastrointestinal: Patient denies nausea, vomiting, constipation, or diarrhea Musculoskeletal: Patient denies muscle cramps or weakness Neurologic: Patient denies convulsions or seizures Allergic/Immunologic: Patient denies recent allergic reaction(s) Hematologic/Lymphatic: Patient denies bleeding tendencies Endocrine: Patient denies heat/cold intolerance  GU: As per HPI.  OBJECTIVE Vitals:   04/25/23 1032  BP: (!) 145/74  Pulse: 85  Temp: 98.2 F (36.8 C)   There is no height or weight on file to calculate BMI.  Physical Examination Constitutional: No obvious distress; patient is non-toxic appearing  Cardiovascular: No visible lower extremity edema.  Respiratory: The patient does not have audible wheezing/stridor; respirations do not appear labored  Gastrointestinal: Abdomen non-distended Musculoskeletal: Normal ROM of UEs  Skin: No obvious rashes/open sores  Neurologic: CN 2-12 grossly intact Psychiatric: Answered questions appropriately with normal affect  Hematologic/Lymphatic/Immunologic: No obvious bruises or sites of spontaneous bleeding  Urine microscopy: >30 WBC/hpf, >30 RBC/hpf, moderate bacteria PVR: 0 ml  ASSESSMENT Kidney stones - Plan: Urinalysis, Routine w reflex microscopic, nitrofurantoin, macrocrystal-monohydrate, (MACROBID) 100 MG  capsule, Urine culture  Abnormal urinalysis - Plan: nitrofurantoin, macrocrystal-monohydrate, (MACROBID) 100 MG capsule, Urine culture  We reviewed CT finding of bilateral staghorn kidney stones (L > R) with no hydronephrosis.   She was advised that Dr. Ronne Binning will be  consulted to determine his recommendations for surgical stone removal, which will likely include multiple procedures starting with percutaneous nephrolithotomy (PCNL). We discussed possible risks and benefits of intervention including but not limited to: including pain, infection, sepsis, UTI, ureter perforation, need for stenting, post-op ureteral stricture, hematuria. Will notify her of Dr. Dimas Millin recommendations and plan to proceed accordingly.  Abnormal UA. Will check urine culture and treat empirically with Macrobid while awaiting culture results and sensitivities.  She was advised to contact urology provider or go to the ER if She develops fever >101F, uncontrollable pain, or other significantly concerning symptoms prior to next office visit.  She verbalized understanding and agreement. All questions were answered.   PLAN Advised the following: 1. Urine culture. 2. Macrobid (Nitrofurantoin) 100 mg 2x/day x7 days. 3. Return for follow up to be determined per MD recommendation.  Orders Placed This Encounter  Procedures   Urine culture   Urinalysis, Routine w reflex microscopic    It has been explained that the patient is to follow regularly with their PCP in addition to all other providers involved in their care and to follow instructions provided by these respective offices. Patient advised to contact urology clinic if any urologic-pertaining questions, concerns, new symptoms or problems arise in the interim period.  There are no Patient Instructions on file for this visit.  Electronically signed by:  Donnita Falls, MSN, FNP-C, CUNP 04/25/2023 11:35 AM

## 2023-04-12 NOTE — Progress Notes (Signed)
 Name: Adrienne Davidson DOB: 01-Sep-1955 MRN: [id number]  History of Present Illness: Adrienne Davidson is a 67 y.o. female who presents today for follow up visit at [facility] [place]. - GU History: 1. Kidney stones. She has never passed a stone.   At initial visit with Dr. Ronne Binning on 02/22/2023: - "She has known large renal calculi bilaterally with last image in July 2023." - Asymptomatic.  - The plan was CT stone study to evaluate stone burden.  Since last visit: > 04/12/2023: CT stone study showed "Partial staghorn calculus seen in the lower pole collecting system of the right kidney measuring 2.6 cm. Several partial staghorn calculi are seen in the left renal collecting system, largest measuring 3.7 cm. No evidence of ureteral calculi or dilatation. Unremarkable unopacified urinary bladder."  Today: She denies recent stone passage. She reports mild intermittent left flank pain; denies right flank pain or abdominal pain. She denies fevers, nausea, or vomiting.  She denies increased urinary urgency, frequency, nocturia, dysuria, gross hematuria, hesitancy, straining to void, or sensations of incomplete emptying.   Fall Screening: Do you usually have a device to assist in your mobility? No   Medications: Current Outpatient Medications  Medication Sig Dispense Refill   amLODipine (NORVASC) 5 MG tablet Take 1 tablet (5 mg total) by mouth daily. 100 tablet 3   Coenzyme Q10 100 MG capsule Take by mouth.     Cranberry 250 MG CAPS Take by mouth.     dapagliflozin propanediol (FARXIGA) 5 MG TABS tablet Take 1 tablet (5 mg total) by mouth daily. 90 tablet 3   Dulaglutide (TRULICITY) 3 MG/0.5ML SOPN Inject 3 mg as directed once a week. 6 mL 3   EPINEPHrine 0.3 mg/0.3 mL IJ SOAJ injection Inject 0.3 mg into the muscle as needed for anaphylaxis. Then call 911 2 each 0   glucose blood test strip Test blood sugar once daily (one touch verio if covered by ins/ otherwise whatever ins  preference is) E11.65 100 each 12   Krill Oil 500 MG CAPS Take by mouth.     Lancets (ONETOUCH ULTRASOFT) lancets Use once daily as instructed E11.65 (one touch verio) 100 each 12   LORazepam (ATIVAN) 0.5 MG tablet Take 0.5-1 tablets (0.25-0.5 mg total) by mouth 2 (two) times daily as needed for anxiety. 10 tablet 0   metFORMIN (GLUCOPHAGE) 1000 MG tablet Take 1 tablet (1,000 mg total) by mouth 2 (two) times daily with a meal. 200 tablet 3   Multiple Vitamins-Minerals (CENTRUM ADULTS) TABS Take by mouth.     nitrofurantoin, macrocrystal-monohydrate, (MACROBID) 100 MG capsule Take 1 capsule (100 mg total) by mouth 2 (two) times daily for 7 days. 14 capsule 0   rosuvastatin (CRESTOR) 10 MG tablet Take 1 tablet (10 mg total) by mouth daily. 100 tablet 3   sertraline (ZOLOFT) 100 MG tablet Take 1 tablet (100 mg total) by mouth daily. 100 tablet 3   amoxicillin-clavulanate (AUGMENTIN) 875-125 MG tablet Take 1 tablet by mouth 2 (two) times daily. 14 tablet 0   No current facility-administered medications for this visit.    Allergies: Allergies  Allergen Reactions   Enalapril Anaphylaxis    Angioedema Aug 2018    Past Medical History:  Diagnosis Date   Allergic rhinitis    Allergy    Anxiety    Arthritis    Diabetes mellitus (HCC)    Hyperlipidemia    Hypertension    Psoriasis    Bilateral feet   Sleep apnea  Sleep apnea    Vitamin D deficiency    Past Surgical History:  Procedure Laterality Date   APPENDECTOMY     CESAREAN SECTION  1980   CESAREAN SECTION  1984   TONSILLECTOMY     Family History  Problem Relation Age of Onset   Non-Hodgkin's lymphoma Mother    Arthritis Mother    Cancer Mother    Heart disease Father    Arthritis Father    COPD Father    Hypertension Father    Diabetes Daughter    Rheum arthritis Daughter    Polycystic ovary syndrome Daughter    Breast cancer Neg Hx    Social History   Socioeconomic History   Marital status: Married    Spouse  name: Aurther Loft   Number of children: 2   Years of education: 12   Highest education level: Associate degree: occupational, Scientist, product/process development, or vocational program  Occupational History   Occupation: paralegal    Comment: retired  Tobacco Use   Smoking status: Former    Current packs/day: 0.00    Average packs/day: 0.3 packs/day for 25.0 years (6.3 ttl pk-yrs)    Types: Cigarettes    Start date: 06/26/1993    Quit date: 06/27/2018    Years since quitting: 4.8   Smokeless tobacco: Never  Vaping Use   Vaping status: Never Used  Substance and Sexual Activity   Alcohol use: Not Currently    Comment: wine once per year   Drug use: Not Currently   Sexual activity: Not Currently    Birth control/protection: None  Other Topics Concern   Not on file  Social History Narrative   Not on file   Social Drivers of Health   Financial Resource Strain: Low Risk  (09/17/2022)   Overall Financial Resource Strain (CARDIA)    Difficulty of Paying Living Expenses: Not very hard  Food Insecurity: No Food Insecurity (09/17/2022)   Hunger Vital Sign    Worried About Running Out of Food in the Last Year: Never true    Ran Out of Food in the Last Year: Never true  Transportation Needs: No Transportation Needs (09/17/2022)   PRAPARE - Administrator, Civil Service (Medical): No    Lack of Transportation (Non-Medical): No  Physical Activity: Insufficiently Active (09/17/2022)   Exercise Vital Sign    Days of Exercise per Week: 3 days    Minutes of Exercise per Session: 40 min  Stress: No Stress Concern Present (09/17/2022)   [employer] of Occupational Health - Occupational Stress Questionnaire    Feeling of Stress : Only a little  Social Connections: Moderately Isolated (09/17/2022)   Social Connection and Isolation Panel [NHANES]    Frequency of Communication with Friends and Family: Three times a week    Frequency of Social Gatherings with Friends and Family: Once a week    Attends Religious  Services: Never    Database administrator or Organizations: No    Attends Banker Meetings: Never    Marital Status: Married  Catering manager Violence: Not At Risk (07/19/2022)   Humiliation, Afraid, Rape, and Kick questionnaire    Fear of Current or Ex-Partner: No    Emotionally Abused: No    Physically Abused: No    Sexually Abused: No    SUBJECTIVE  Review of Systems Constitutional: Patient denies any unintentional weight loss or change in strength lntegumentary: Patient denies any rashes or pruritus Cardiovascular: Patient denies chest pain or syncope Respiratory:  Patient denies shortness of breath Gastrointestinal: Patient denies nausea, vomiting, constipation, or diarrhea Musculoskeletal: Patient denies muscle cramps or weakness Neurologic: Patient denies convulsions or seizures Allergic/Immunologic: Patient denies recent allergic reaction(s) Hematologic/Lymphatic: Patient denies bleeding tendencies Endocrine: Patient denies heat/cold intolerance  GU: As per HPI.  OBJECTIVE Vitals:   04/25/23 1032  BP: (!) 145/74  Pulse: 85  Temp: 98.2 F (36.8 C)   There is no height or weight on file to calculate BMI.  Physical Examination Constitutional: No obvious distress; patient is non-toxic appearing  Cardiovascular: No visible lower extremity edema.  Respiratory: The patient does not have audible wheezing/stridor; respirations do not appear labored  Gastrointestinal: Abdomen non-distended Musculoskeletal: Normal ROM of UEs  Skin: No obvious rashes/open sores  Neurologic: CN 2-12 grossly intact Psychiatric: Answered questions appropriately with normal affect  Hematologic/Lymphatic/Immunologic: No obvious bruises or sites of spontaneous bleeding  Urine microscopy: >30 WBC/hpf, >30 RBC/hpf, moderate bacteria PVR: 0 ml  ASSESSMENT Kidney stones - Plan: Urinalysis, Routine w reflex microscopic, nitrofurantoin, macrocrystal-monohydrate, (MACROBID) 100 MG  capsule, Urine culture  Abnormal urinalysis - Plan: nitrofurantoin, macrocrystal-monohydrate, (MACROBID) 100 MG capsule, Urine culture  We reviewed CT finding of bilateral staghorn kidney stones (L > R) with no hydronephrosis.   She was advised that Dr. Ronne Binning will be  consulted to determine his recommendations for surgical stone removal, which will likely include multiple procedures starting with percutaneous nephrolithotomy (PCNL). We discussed possible risks and benefits of intervention including but not limited to: including pain, infection, sepsis, UTI, ureter perforation, need for stenting, post-op ureteral stricture, hematuria. Will notify her of Dr. Dimas Millin recommendations and plan to proceed accordingly.  Abnormal UA. Will check urine culture and treat empirically with Macrobid while awaiting culture results and sensitivities.  She was advised to contact urology provider or go to the ER if She develops fever >101F, uncontrollable pain, or other significantly concerning symptoms prior to next office visit.  She verbalized understanding and agreement. All questions were answered.   PLAN Advised the following: 1. Urine culture. 2. Macrobid (Nitrofurantoin) 100 mg 2x/day x7 days. 3. Return for follow up to be determined per MD recommendation.  Orders Placed This Encounter  Procedures   Urine culture   Urinalysis, Routine w reflex microscopic    It has been explained that the patient is to follow regularly with their PCP in addition to all other providers involved in their care and to follow instructions provided by these respective offices. Patient advised to contact urology clinic if any urologic-pertaining questions, concerns, new symptoms or problems arise in the interim period.  There are no Patient Instructions on file for this visit.  Electronically signed by:  Donnita Falls, MSN, FNP-C, CUNP 04/25/2023 11:35 AM

## 2023-04-25 ENCOUNTER — Ambulatory Visit: Payer: Medicare Other | Admitting: Urology

## 2023-04-25 ENCOUNTER — Encounter: Payer: Self-pay | Admitting: Urology

## 2023-04-25 VITALS — BP 145/74 | HR 85 | Temp 98.2°F

## 2023-04-25 DIAGNOSIS — N200 Calculus of kidney: Secondary | ICD-10-CM

## 2023-04-25 DIAGNOSIS — R8290 Unspecified abnormal findings in urine: Secondary | ICD-10-CM

## 2023-04-25 LAB — MICROSCOPIC EXAMINATION
RBC, Urine: >30 /[HPF] — AB (ref 0–2)
WBC, UA: >30 /[HPF] — AB (ref 0–5)

## 2023-04-25 LAB — URINALYSIS, ROUTINE W REFLEX MICROSCOPIC
Bilirubin, UA: NEGATIVE
Ketones, UA: NEGATIVE
Nitrite, UA: NEGATIVE
Specific Gravity, UA: 1.03 (ref 1.005–1.030)
Urobilinogen, Ur: 0.2 mg/dL (ref 0.2–1.0)
pH, UA: 5.5 (ref 5.0–7.5)

## 2023-04-25 MED ORDER — NITROFURANTOIN MONOHYD MACRO 100 MG PO CAPS: 100.0000 mg | ORAL_CAPSULE | Freq: Two times a day (BID) | ORAL | 0 refills | Status: AC

## 2023-04-25 NOTE — Progress Notes (Signed)
 PVR 0

## 2023-04-26 ENCOUNTER — Other Ambulatory Visit: Payer: Self-pay | Admitting: Urology

## 2023-04-26 DIAGNOSIS — N200 Calculus of kidney: Secondary | ICD-10-CM

## 2023-04-27 LAB — URINE CULTURE

## 2023-05-02 ENCOUNTER — Other Ambulatory Visit: Payer: Self-pay

## 2023-05-02 DIAGNOSIS — N200 Calculus of kidney: Secondary | ICD-10-CM

## 2023-05-08 ENCOUNTER — Telehealth: Payer: Self-pay | Admitting: Family Medicine

## 2023-05-08 ENCOUNTER — Other Ambulatory Visit: Payer: Self-pay | Admitting: Family Medicine

## 2023-05-08 NOTE — Telephone Encounter (Signed)
 Dapagliflozin  is generic Farxiga . If insurance does not prefer generic, pharmacist can select DAW2 and put her back on brand and it should go through. No need for new rx but please have her reach out if she runs into any problems.

## 2023-05-08 NOTE — Telephone Encounter (Signed)
 Pt received a letter in the mail stating that her DAPAGLIFLOZI TAB 5MG  is either no longer on her drug formulary or is but with certain limitations. Also lists Farxiga  or Jardiance as alternatives. Pt says she spoke with the pharmacist on 04/26/23 and was told that she needed to stay on the generic. Pt just wants to make sure this is all correct and doesn't need to make any changes to her meds. Doesn't want to pay more than what she is already paying.

## 2023-05-09 ENCOUNTER — Other Ambulatory Visit (HOSPITAL_COMMUNITY): Payer: Self-pay

## 2023-05-09 NOTE — Telephone Encounter (Signed)
[email or web address]

## 2023-05-10 NOTE — Telephone Encounter (Signed)
Copied from CRM [id number]. Topic: General - Other >> May 09, 2023  4:10 PM Antony Haste wrote: Reason for CRM: The patient states she came by the office yesterday on 01/15 to provide Dr. Nadine Counts with paperwork pertaining to a "generic" alternative for dapagliflozin propanediol (FARXIGA). She states her insurance company has requested a prior authorization for this prescription. She wants to ensure a callback once this has been completed, and she states this must be refilled no later than 02/03 because she will be out of it by then.

## 2023-05-13 ENCOUNTER — Other Ambulatory Visit (HOSPITAL_COMMUNITY): Payer: Self-pay

## 2023-05-16 NOTE — Progress Notes (Deleted)
Name: Adrienne Davidson DOB: 10/05/1955 MRN: [id number]  Diagnoses: Post-operative state  HPI: She presents postoperatively. ***She is accompanied by ***. - GU history includes:  1. Kidney stones.  - She has never spontaneously passed a stone.  - 04/12/2023: CT stone study showed "Partial staghorn calculus seen in the lower pole collecting system of the right kidney measuring 2.6 cm. Several partial staghorn calculi are seen in the left renal collecting system, largest measuring 3.7 cm."   She underwent the following procedures by Dr. Ronne Binning on 05/23/2023:  ***  Postop course: Today She reports ***  She reports the left percutaneous nephrostomy is draining ***; {Actions; denies-reports:120008} gross hematuria.  She {Actions; denies-reports:120008} redness, warmth, tenderness, swelling, or drainage at the incision site(s).  She {Actions; denies-reports:120008} flank pain or abdominal pain. She {Actions; denies-reports:120008} fevers, nausea, or vomiting.  She {Actions; denies-reports:120008} increased urinary urgency, frequency, nocturia, dysuria, gross hematuria, hesitancy, straining to void, or sensations of incomplete emptying.   Fall Screening: Do you usually have a device to assist in your mobility? {yes/no:20286} ***cane / ***walker / ***wheelchair   Medications: Current Outpatient Medications  Medication Sig Dispense Refill   acetaminophen (TYLENOL) 500 MG tablet Take 500-1,000 mg by mouth every 6 (six) hours as needed (pain.).     amLODipine (NORVASC) 5 MG tablet Take 1 tablet (5 mg total) by mouth daily. (Patient taking differently: Take 5 mg by mouth every evening.) 100 tablet 3   Cholecalciferol (D-3-5) 125 MCG (5000 UT) capsule Take 5,000 Units by mouth in the morning.     Coenzyme Q10 200 MG capsule Take 200 mg by mouth at bedtime.     CRANBERRY PO Take 500 mg by mouth at bedtime.     dapagliflozin propanediol (FARXIGA) 5 MG TABS tablet Take 1 tablet (5 mg total)  by mouth daily. 90 tablet 3   Dulaglutide (TRULICITY) 3 MG/0.5ML SOPN Inject 3 mg as directed once a week. (Patient taking differently: Inject 3 mg as directed every Monday.) 6 mL 3   EPINEPHrine 0.3 mg/0.3 mL IJ SOAJ injection Inject 0.3 mg into the muscle as needed for anaphylaxis. Then call 911 2 each 0   glucose blood test strip Test blood sugar once daily (one touch verio if covered by ins/ otherwise whatever ins preference is) E11.65 100 each 12   Krill Oil 500 MG CAPS Take 500 mg by mouth in the morning.     Lancets (ONETOUCH ULTRASOFT) lancets Use once daily as instructed E11.65 (one touch verio) 100 each 12   LORazepam (ATIVAN) 0.5 MG tablet Take 0.5-1 tablets (0.25-0.5 mg total) by mouth 2 (two) times daily as needed for anxiety. 10 tablet 0   metFORMIN (GLUCOPHAGE) 1000 MG tablet Take 1 tablet (1,000 mg total) by mouth 2 (two) times daily with a meal. 200 tablet 3   Multiple Vitamin (MULTIVITAMIN WITH MINERALS) TABS tablet Take 1 tablet by mouth in the morning.     rosuvastatin (CRESTOR) 10 MG tablet Take 1 tablet (10 mg total) by mouth daily. (Patient taking differently: Take 10 mg by mouth every evening.) 100 tablet 3   sertraline (ZOLOFT) 100 MG tablet Take 1 tablet (100 mg total) by mouth daily. 100 tablet 3   No current facility-administered medications for this visit.    Allergies: Allergies  Allergen Reactions   Enalapril Anaphylaxis    Angioedema Aug 2018    Past Medical History:  Diagnosis Date   Allergic rhinitis    Allergy    Anxiety  Arthritis    Diabetes mellitus (HCC)    Hyperlipidemia    Hypertension    Psoriasis    Bilateral feet   Sleep apnea    Sleep apnea    Vitamin D deficiency    Past Surgical History:  Procedure Laterality Date   APPENDECTOMY     CESAREAN SECTION  1980   CESAREAN SECTION  1984   TONSILLECTOMY     Family History  Problem Relation Age of Onset   Non-Hodgkin's lymphoma Mother    Arthritis Mother    Cancer Mother     Heart disease Father    Arthritis Father    COPD Father    Hypertension Father    Diabetes Daughter    Rheum arthritis Daughter    Polycystic ovary syndrome Daughter    Breast cancer Neg Hx    Social History   Socioeconomic History   Marital status: Married    Spouse name: Aurther Loft   Number of children: 2   Years of education: 12   Highest education level: Associate degree: occupational, Scientist, product/process development, or vocational program  Occupational History   Occupation: paralegal    Comment: retired  Tobacco Use   Smoking status: Former    Current packs/day: 0.00    Average packs/day: 0.3 packs/day for 25.0 years (6.3 ttl pk-yrs)    Types: Cigarettes    Start date: 06/26/1993    Quit date: 06/27/2018    Years since quitting: 4.8   Smokeless tobacco: Never  Vaping Use   Vaping status: Never Used  Substance and Sexual Activity   Alcohol use: Not Currently    Comment: wine once per year   Drug use: Not Currently   Sexual activity: Not Currently    Birth control/protection: None  Other Topics Concern   Not on file  Social History Narrative   Not on file   Social Drivers of Health   Financial Resource Strain: Low Risk  (09/17/2022)   Overall Financial Resource Strain (CARDIA)    Difficulty of Paying Living Expenses: Not very hard  Food Insecurity: No Food Insecurity (09/17/2022)   Hunger Vital Sign    Worried About Running Out of Food in the Last Year: Never true    Ran Out of Food in the Last Year: Never true  Transportation Needs: No Transportation Needs (09/17/2022)   PRAPARE - Administrator, Civil Service (Medical): No    Lack of Transportation (Non-Medical): No  Physical Activity: Insufficiently Active (09/17/2022)   Exercise Vital Sign    Days of Exercise per Week: 3 days    Minutes of Exercise per Session: 40 min  Stress: No Stress Concern Present (09/17/2022)   [employer] of Occupational Health - Occupational Stress Questionnaire    Feeling of Stress : Only a  little  Social Connections: Moderately Isolated (09/17/2022)   Social Connection and Isolation Panel [NHANES]    Frequency of Communication with Friends and Family: Three times a week    Frequency of Social Gatherings with Friends and Family: Once a week    Attends Religious Services: Never    Database administrator or Organizations: No    Attends Banker Meetings: Never    Marital Status: Married  Catering manager Violence: Not At Risk (07/19/2022)   Humiliation, Afraid, Rape, and Kick questionnaire    Fear of Current or Ex-Partner: No    Emotionally Abused: No    Physically Abused: No    Sexually Abused: No  SUBJECTIVE  Review of Systems Constitutional: Patient denies any unintentional weight loss or change in strength lntegumentary: Patient denies any rashes or pruritus Cardiovascular: Patient denies chest pain or syncope Respiratory: Patient denies shortness of breath Gastrointestinal: Patient ***denies constipation or diarrhea Musculoskeletal: Patient denies muscle cramps or weakness Neurologic: Patient denies convulsions or seizures Allergic/Immunologic: Patient denies recent allergic reaction(s) Hematologic/Lymphatic: Patient denies bleeding tendencies Endocrine: Patient denies heat/cold intolerance  GU: As per HPI.  OBJECTIVE There were no vitals filed for this visit. There is no height or weight on file to calculate BMI.  Physical Examination Constitutional: No obvious distress; patient is non-toxic appearing  Cardiovascular: No visible lower extremity edema.  Respiratory: The patient does not have audible wheezing/stridor; respirations do not appear labored  Gastrointestinal: Abdomen non-distended Musculoskeletal: Normal ROM of UEs  Skin: No obvious rashes/open sores  Neurologic: CN 2-12 grossly intact Psychiatric: Answered questions appropriately with normal affect  Hematologic/Lymphatic/Immunologic: No obvious bruises or sites of spontaneous  bleeding  GU:  The left PCN tube site is dry with no surrounding erythema, edema, crepitus, drainage / discharge, warmth, significant tenderness to palpation. Draining ***clear ***yellow urine into catheter bag.  UA: ***negative / *** WBC/hpf, *** RBC/hpf, *** bacteria ***Urine microscopy: ***negative / *** WBC/hpf, *** RBC/hpf, *** bacteria ***with no evidence of UTI ***with no evidence of microscopic hematuria ***otherwise unremarkable  PVR: *** ml  ASSESSMENT No diagnosis found.  We reviewed the operative procedures and findings. Pre-operative symptoms are *** since the procedure. Surgical site healing ***well. Pain is ***well controlled. The left PCN tube was removed; patient tolerated ***well.  We agreed to plan for follow up in *** weeks with ***Dr. Ronne Binning or sooner if needed. Patient verbalized understanding of and agreement with current plan. All questions were answered.  PLAN Advised the following: ***PCN tube discontinued. ***No follow-ups on file.  ***No global period (charge regular E&M) - Cryotherapy - Cystoscopy w/ bladder Botox - Cystoscopy w/ bladder biopsy - Cystoscopy w/ retrograde pyelogram - Cystoscopy w/ hydrodistention - Cystoscopy w/ ureteral stent placement - Cystolitholapaxy - Ureteroscopy w/ ureteral stent placement - Ureteroscopy w/ stone removal - Urolift - TURBT - Prostate biopsy  ***10 day global period: - Circumcision  ***Everything else pretty much is 90 day global period (including TURP)  No orders of the defined types were placed in this encounter.   It has been explained that the patient is to follow regularly with their PCP in addition to all other providers involved in their care and to follow instructions provided by these respective offices. Patient advised to contact urology clinic if any urologic-pertaining questions, concerns, new symptoms or problems arise in the interim period.  There are no Patient Instructions on file  for this visit.  Electronically signed by:  Donnita Falls, MSN, FNP-C, CUNP 05/16/2023 12:49 PM

## 2023-05-20 ENCOUNTER — Telehealth: Payer: Self-pay

## 2023-05-20 NOTE — Telephone Encounter (Signed)
Patient called to let Dr. Ronne Binning know that she has had a cough, no fever and negative covid test.  I tried to call her back with no answer, sent a message over mychart informing her to keep scheduled appt as long as she has no fever.

## 2023-05-20 NOTE — Patient Instructions (Signed)
Adrienne Davidson  05/20/2023     @PREFPERIOPPHARMACY @   Your procedure is scheduled on  05/23/2023.   Report to [facility] at  0730  A.M.   Call this number if you have problems the morning of surgery:  [phone]  If you experience any cold or flu symptoms such as cough, fever, chills, shortness of breath, etc. between now and your scheduled surgery, please notify us at the above number.   Remember:  Do not eat after midnight.   You may drink clear liquids until 0530 am on 05/23/2023.    Clear liquids allowed are:                    Water, Juice (No red color; non-citric and without pulp; diabetics please choose diet or no sugar options), Carbonated beverages (diabetics please choose diet or no sugar options), Clear Tea (No creamer, milk, or cream, including half & half and powdered creamer), Black Coffee Only (No creamer, milk or cream, including half & half and powdered creamer), and Clear Sports drink (No red color; diabetics please choose diet or no sugar options)    Take these medicines the morning of surgery with A SIP OF WATER                           lorazepam (if needed), sertraline.    Do not wear jewelry, make-up or nail polish, including gel polish,  artificial nails, or any other type of covering on natural nails (fingers and  toes).  Do not wear lotions, powders, or perfumes, or deodorant.  Do not shave 48 hours prior to surgery.  Men may shave face and neck.  Do not bring valuables to the hospital.  [facility] is not responsible for any belongings or valuables.  Contacts, dentures or bridgework may not be worn into surgery.  Leave your suitcase in the car.  After surgery it may be brought to your room.  For patients admitted to the hospital, discharge time will be determined by your treatment team.  Patients discharged the day of surgery will not be allowed to drive home and must have someone with them for 24 hours.    Special instructions:   DO  NOT smoke tobacco or vape for 24 hours before your procedure.  Please read over the following fact sheets that you were given. Coughing and Deep Breathing, Surgical Site Infection Prevention, Anesthesia Post-op Instructions, and Care and Recovery After Surgery       Percutaneous Nephrolithotomy, Care After After percutaneous nephrolithotomy, it is common to have: Soreness or pain. A small amount of blood or clear fluid coming from your incision for a few days. Tiredness (fatigue). Some blood in your pee (urine). This will last for a few days. A feeling of needing to pee (urinate) often. It may feel urgent. You may have this if you have a small mesh tube (stent) in the part of your body that connects your bladder to your kidneys (ureter). Follow these instructions at home: Medicines Take over-the-counter and prescription medicines only as told by your health care provider. If you were prescribed antibiotics, take them as told by your provider. Do not stop using the antibiotic even if you start to feel better. Ask your provider if the medicine prescribed to you: Requires you to avoid driving or using machinery. Can cause constipation. You may need to take these actions to prevent or  treat constipation: Drink enough fluid to keep your pee pale yellow. Take over-the-counter or prescription medicines. Eat foods that are high in fiber, such as beans, whole grains, and fresh fruits and vegetables. Limit foods that are high in fat and processed sugars, such as fried or sweet foods. Incision care  Follow instructions from your provider about how to take care of your incision. Make sure you: Wash your hands with soap and water for at least 20 seconds before and after you change your bandage (dressing). If soap and water are not available, use hand sanitizer. Change your dressing as told by your provider. Leave stitches (sutures), skin glue, or tape strips in place. These skin closures may need  to stay in place for 2 weeks or longer. If tape strip edges start to loosen and curl up, you may trim the loose edges. Do not remove tape strips completely unless your provider tells you to do that. Check your incision area every day for signs of infection. Check for: More redness, swelling, or pain. More fluid or blood. Warmth. Pus or a bad smell. Do not take baths, swim, or use a hot tub until your provider approves. Ask your provider if you may take showers. You may only be allowed to take sponge baths. Activity Avoid activities that take a lot of effort for as long as told by your provider. Return to your normal activities as told by your provider. Ask your provider what activities are safe for you. General instructions If you were sent home with a soft tube (catheter) or a surgical drain (nephrostomy tube), follow your provider's instructions on how to take care of it. Wear compression stockings as told by your provider. These stockings help to prevent blood clots and reduce swelling in your legs. Do not use any products that contain nicotine or tobacco. These products include cigarettes, chewing tobacco, and vaping devices, such as e-cigarettes. These can delay incision healing after surgery. If you need help quitting, ask your provider. Keep all follow-up visits. If you have a stent, it will need to be removed after 1-2 weeks. Your provider may give you more instructions. Make sure you know what you can and cannot do. Contact a health care provider if: You have a fever. Your incision shows any signs of infection. You lose your appetite, feel nauseous, or vomit. You have a catheter, stent, or drain and the flow of pee stops all of a sudden. Then you have pain in your kidney. Get help right away if: There is a lot of blood in your pee. You have blood clots in your pee. You cannot pee. You have chest pain or trouble breathing. These symptoms may be an emergency. Get help right away.  Call 911. Do not wait to see if the symptoms will go away. Do not drive yourself to the hospital. This information is not intended to replace advice given to you by your health care provider. Make sure you discuss any questions you have with your health care provider. Document Revised: 12/07/2021 Document Reviewed: 12/07/2021 Elsevier Patient Education  2024 [employer].General Anesthesia, Adult, Care After The following information offers guidance on how to care for yourself after your procedure. Your health care provider may also give you more specific instructions. If you have problems or questions, contact your health care provider. What can I expect after the procedure? After the procedure, it is common for people to: Have pain or discomfort at the IV site. Have nausea or vomiting. Have a  sore throat or hoarseness. Have trouble concentrating. Feel cold or chills. Feel weak, sleepy, or tired (fatigue). Have soreness and body aches. These can affect parts of the body that were not involved in surgery. Follow these instructions at home: For the time period you were told by your health care provider:  Rest. Do not participate in activities where you could fall or become injured. Do not drive or use machinery. Do not drink alcohol. Do not take sleeping pills or medicines that cause drowsiness. Do not make important decisions or sign legal documents. Do not take care of children on your own. General instructions Drink enough fluid to keep your urine pale yellow. If you have sleep apnea, surgery and certain medicines can increase your risk for breathing problems. Follow instructions from your health care provider about wearing your sleep device: Anytime you are sleeping, including during daytime naps. While taking prescription pain medicines, sleeping medicines, or medicines that make you drowsy. Return to your normal activities as told by your health care provider. Ask your health  care provider what activities are safe for you. Take over-the-counter and prescription medicines only as told by your health care provider. Do not use any products that contain nicotine or tobacco. These products include cigarettes, chewing tobacco, and vaping devices, such as e-cigarettes. These can delay incision healing after surgery. If you need help quitting, ask your health care provider. Contact a health care provider if: You have nausea or vomiting that does not get better with medicine. You vomit every time you eat or drink. You have pain that does not get better with medicine. You cannot urinate or have bloody urine. You develop a skin rash. You have a fever. Get help right away if: You have trouble breathing. You have chest pain. You vomit blood. These symptoms may be an emergency. Get help right away. Call 911. Do not wait to see if the symptoms will go away. Do not drive yourself to the hospital. Summary After the procedure, it is common to have a sore throat, hoarseness, nausea, vomiting, or to feel weak, sleepy, or fatigue. For the time period you were told by your health care provider, do not drive or use machinery. Get help right away if you have difficulty breathing, have chest pain, or vomit blood. These symptoms may be an emergency. This information is not intended to replace advice given to you by your health care provider. Make sure you discuss any questions you have with your health care provider. Document Revised: 07/07/2021 Document Reviewed: 07/07/2021 Elsevier Patient Education  2024 Else[employer] Inc.How to Use Chlorhexidine at Home in the Shower Chlorhexidine gluconate (CHG) is a germ-killing (antiseptic) wash that's used to clean the skin. It can get rid of the germs that normally live on the skin and can keep them away for about 24 hours. If you're having surgery, you may be told to shower with CHG at home the night before surgery. This can help lower your risk for  infection. To use CHG wash in the shower, follow the steps below. Supplies needed: CHG body wash. Clean washcloth. Clean towel. How to use CHG in the shower Follow these steps unless you're told to use CHG in a different way: Start the shower. Use your normal soap and shampoo to wash your face and hair. Turn off the shower or move out of the shower stream. Pour CHG onto a clean washcloth. Do not use any type of brush or rough sponge. Start at your neck, washing your  body down to your toes. Make sure you: Wash the part of your body where the surgery will be done for at least 1 minute. Do not scrub. Do not use CHG on your head or face unless your health care provider tells you to. If it gets into your ears or eyes, rinse them well with water. Do not wash your genitals with CHG. Wash your back and under your arms. Make sure to wash skin folds. Let the CHG sit on your skin for 1-2 minutes or as long as told. Rinse your entire body in the shower, including all body creases and folds. Turn off the shower. Dry off with a clean towel. Do not put anything on your skin afterward, such as powder, lotion, or perfume. Put on clean clothes or pajamas. If it's the night before surgery, sleep in clean sheets. General tips Use CHG only as told, and follow the instructions on the label. Use the full amount of CHG as told. This is often one bottle. Do not smoke and stay away from flames after using CHG. Your skin may feel sticky after using CHG. This is normal. The sticky feeling will go away as the CHG dries. Do not use CHG: If you have a chlorhexidine allergy or have reacted to chlorhexidine in the past. On open wounds or areas of skin that have broken skin, cuts, or scrapes. On babies younger than 57 months of age. Contact a health care provider if: You have questions about using CHG. Your skin gets irritated or itchy. You have a rash after using CHG. You swallow any CHG. Call your local poison  control center [phone] in the [place] Your eyes itch badly, or they become very red or swollen. Your hearing changes. You have trouble seeing. If you can't reach your provider, go to an urgent care or emergency room. Do not drive yourself. Get help right away if: You have swelling or tingling in your mouth or throat. You make high-pitched whistling sounds when you breathe, most often when you breathe out (wheeze). You have trouble breathing. These symptoms may be an emergency. Call 911 right away. Do not wait to see if the symptoms will go away. Do not drive yourself to the hospital. This information is not intended to replace advice given to you by your health care provider. Make sure you discuss any questions you have with your health care provider. Document Revised: 10/23/2022 Document Reviewed: 10/19/2021 Elsevier Patient Education  2024 [employer].

## 2023-05-21 ENCOUNTER — Other Ambulatory Visit: Payer: Self-pay | Admitting: Radiology

## 2023-05-21 ENCOUNTER — Encounter (HOSPITAL_COMMUNITY)
Admission: RE | Admit: 2023-05-21 | Discharge: 2023-05-21 | Disposition: A | Discharge status: Home / Self Care | Payer: Medicare Other | Source: Ambulatory Visit | Attending: Urology | Admitting: Urology

## 2023-05-21 VITALS — BP 124/54 | HR 83 | Temp 97.9°F | Resp 18 | Ht 63.0 in | Wt 164.5 lb

## 2023-05-21 DIAGNOSIS — N200 Calculus of kidney: Secondary | ICD-10-CM

## 2023-05-21 DIAGNOSIS — I152 Hypertension secondary to endocrine disorders: Secondary | ICD-10-CM | POA: Diagnosis not present

## 2023-05-21 DIAGNOSIS — E1169 Type 2 diabetes mellitus with other specified complication: Secondary | ICD-10-CM | POA: Diagnosis not present

## 2023-05-21 DIAGNOSIS — Z01818 Encounter for other preprocedural examination: Secondary | ICD-10-CM | POA: Diagnosis not present

## 2023-05-21 DIAGNOSIS — E1159 Type 2 diabetes mellitus with other circulatory complications: Secondary | ICD-10-CM | POA: Diagnosis not present

## 2023-05-21 LAB — BASIC METABOLIC PANEL
Anion gap: 14 (ref 5–15)
BUN: 18 mg/dL (ref 8–23)
CO2: 21 mmol/L — ABNORMAL LOW (ref 22–32)
Calcium: 9.6 mg/dL (ref 8.9–10.3)
Chloride: 104 mmol/L (ref 98–111)
Creatinine, Ser: 0.99 mg/dL (ref 0.44–1.00)
GFR, Estimated: >60 mL/min (ref >60)
Glucose, Bld: 160 mg/dL — ABNORMAL HIGH (ref 70–99)
Potassium: 3.4 mmol/L — ABNORMAL LOW (ref 3.5–5.1)
Sodium: 139 mmol/L (ref 135–145)

## 2023-05-21 LAB — CBC WITH DIFFERENTIAL/PLATELET
Abs Immature Granulocytes: 0.01 10*3/uL (ref 0.00–0.07)
Basophils Absolute: 0 10*3/uL (ref 0.0–0.1)
Basophils Relative: 1 %
Eosinophils Absolute: 0.3 10*3/uL (ref 0.0–0.5)
Eosinophils Relative: 7 %
HCT: 38.4 % (ref 36.0–46.0)
Hemoglobin: 12.5 g/dL (ref 12.0–15.0)
Immature Granulocytes: 0 %
Lymphocytes Relative: 35 %
Lymphs Abs: 1.5 10*3/uL (ref 0.7–4.0)
MCH: 27.4 pg (ref 26.0–34.0)
MCHC: 32.6 g/dL (ref 30.0–36.0)
MCV: 84.2 fL (ref 80.0–100.0)
Monocytes Absolute: 0.7 10*3/uL (ref 0.1–1.0)
Monocytes Relative: 16 %
Neutro Abs: 1.8 10*3/uL (ref 1.7–7.7)
Neutrophils Relative %: 41 %
Platelets: 227 10*3/uL (ref 150–400)
RBC: 4.56 MIL/uL (ref 3.87–5.11)
RDW: 15.2 % (ref 11.5–15.5)
WBC: 4.5 10*3/uL (ref 4.0–10.5)
nRBC: 0 % (ref 0.0–0.2)

## 2023-05-21 LAB — PROTIME-INR
INR: 0.9 (ref 0.8–1.2)
Prothrombin Time: 12 s (ref 11.4–15.2)

## 2023-05-21 LAB — HEMOGLOBIN A1C
Hgb A1c MFr Bld: 6.7 % — ABNORMAL HIGH (ref 4.8–5.6)
Mean Plasma Glucose: 145.59 mg/dL

## 2023-05-21 NOTE — H&P (Signed)
Chief Complaint: Patient was seen in consultation today for nephrolithiasis   Procedure: Left Nephrostomy Tube Placement  Referring Physician(s): Larocco,Sarah C / Dr. Wilkie Aye   Supervising Physician: Gilmer Mor  Patient Status: [facility] - Out-pt  History of Present Illness: Adrienne Davidson is a 68 y.o. female with a history of DM, HTN, OSA on CPAP, HLD, and Arthritis presenting due to nephrolithiasis. Patient has been following with Urology in [place] since November 2024 for 'known large renal calculi bilaterally,' none of which she has ever passed. On 04/12/23 CT Renal Stone Study significant for:   IMPRESSION: Large partial staghorn calculi in both renal collecting systems. No evidence of ureteral calculi or dilatation.  Patient reported the development of mild, intermittent left flank pain at her last appointment on 04/25/23. Denies any right flank pian, N/V, urgency, frequency, dysuria, or gross hematuria. Referred to IR for percutaneous nephrostomy tube placement prior to nephrolithotomy on 05/23/23 with Dr. Ronne Binning.   Patient currently ***  Code Status: ***  Past Medical History:  Diagnosis Date   Allergic rhinitis    Allergy    Anxiety    Arthritis    Diabetes mellitus (HCC)    Hyperlipidemia    Hypertension    Psoriasis    Bilateral feet   Sleep apnea    Sleep apnea    Vitamin D deficiency     Past Surgical History:  Procedure Laterality Date   APPENDECTOMY     CESAREAN SECTION  1980   CESAREAN SECTION  1984   TONSILLECTOMY      Allergies: Enalapril  Medications: Prior to Admission medications   Medication Sig Start Date End Date Taking? Authorizing Provider  acetaminophen (TYLENOL) 500 MG tablet Take 500-1,000 mg by mouth every 6 (six) hours as needed (pain.).    [provider]  amLODipine (NORVASC) 5 MG tablet Take 1 tablet (5 mg total) by mouth daily. Patient taking differently: Take 5 mg by mouth every evening. 01/18/23    Raliegh Ip, DO  Cholecalciferol (D-3-5) 125 MCG (5000 UT) capsule Take 5,000 Units by mouth in the morning.    [provider]  Coenzyme Q10 200 MG capsule Take 200 mg by mouth at bedtime.    [provider]  CRANBERRY PO Take 500 mg by mouth at bedtime.    [provider]  dapagliflozin propanediol (FARXIGA) 5 MG TABS tablet Take 1 tablet (5 mg total) by mouth daily. 09/21/22   Raliegh Ip, DO  Dulaglutide (TRULICITY) 3 MG/0.5ML SOPN Inject 3 mg as directed once a week. Patient taking differently: Inject 3 mg as directed every Monday. 01/18/23   Raliegh Ip, DO  EPINEPHrine 0.3 mg/0.3 mL IJ SOAJ injection Inject 0.3 mg into the muscle as needed for anaphylaxis. Then call 911 06/13/22   Delynn Flavin M, DO  glucose blood test strip Test blood sugar once daily (one touch verio if covered by ins/ otherwise whatever ins preference is) E11.65 11/25/20   Raliegh Ip, DO  Krill Oil 500 MG CAPS Take 500 mg by mouth in the morning.    [provider]  Lancets Letta Pate ULTRASOFT) lancets Use once daily as instructed E11.65 (one touch verio) 11/25/20   Delynn Flavin M, DO  LORazepam (ATIVAN) 0.5 MG tablet Take 0.5-1 tablets (0.25-0.5 mg total) by mouth 2 (two) times daily as needed for anxiety. 01/18/23   Raliegh Ip, DO  metFORMIN (GLUCOPHAGE) 1000 MG tablet Take 1 tablet (1,000 mg total) by mouth  2 (two) times daily with a meal. 01/18/23   Raliegh Ip, DO  Multiple Vitamin (MULTIVITAMIN WITH MINERALS) TABS tablet Take 1 tablet by mouth in the morning.    [provider]  rosuvastatin (CRESTOR) 10 MG tablet Take 1 tablet (10 mg total) by mouth daily. Patient taking differently: Take 10 mg by mouth every evening. 01/18/23   Delynn Flavin M, DO  sertraline (ZOLOFT) 100 MG tablet Take 1 tablet (100 mg total) by mouth daily. 01/18/23   Raliegh Ip, DO     Family History  Problem Relation Age of Onset    Non-Hodgkin's lymphoma Mother    Arthritis Mother    Cancer Mother    Heart disease Father    Arthritis Father    COPD Father    Hypertension Father    Diabetes Daughter    Rheum arthritis Daughter    Polycystic ovary syndrome Daughter    Breast cancer Neg Hx     Social History   Socioeconomic History   Marital status: Married    Spouse name: Aurther Loft   Number of children: 2   Years of education: 12   Highest education level: Associate degree: occupational, Scientist, product/process development, or vocational program  Occupational History   Occupation: paralegal    Comment: retired  Tobacco Use   Smoking status: Former    Current packs/day: 0.00    Average packs/day: 0.3 packs/day for 25.0 years (6.3 ttl pk-yrs)    Types: Cigarettes    Start date: 06/26/1993    Quit date: 06/27/2018    Years since quitting: 4.9   Smokeless tobacco: Never  Vaping Use   Vaping status: Never Used  Substance and Sexual Activity   Alcohol use: Not Currently    Comment: wine once per year   Drug use: Not Currently   Sexual activity: Not Currently    Birth control/protection: None  Other Topics Concern   Not on file  Social History Narrative   Not on file   Social Drivers of Health   Financial Resource Strain: Low Risk  (09/17/2022)   Overall Financial Resource Strain (CARDIA)    Difficulty of Paying Living Expenses: Not very hard  Food Insecurity: No Food Insecurity (09/17/2022)   Hunger Vital Sign    Worried About Running Out of Food in the Last Year: Never true    Ran Out of Food in the Last Year: Never true  Transportation Needs: No Transportation Needs (09/17/2022)   PRAPARE - Administrator, Civil Service (Medical): No    Lack of Transportation (Non-Medical): No  Physical Activity: Insufficiently Active (09/17/2022)   Exercise Vital Sign    Days of Exercise per Week: 3 days    Minutes of Exercise per Session: 40 min  Stress: No Stress Concern Present (09/17/2022)   [employer] of Occupational  Health - Occupational Stress Questionnaire    Feeling of Stress : Only a little  Social Connections: Moderately Isolated (09/17/2022)   Social Connection and Isolation Panel [NHANES]    Frequency of Communication with Friends and Family: Three times a week    Frequency of Social Gatherings with Friends and Family: Once a week    Attends Religious Services: Never    Database administrator or Organizations: No    Attends Banker Meetings: Never    Marital Status: Married    Review of Systems  Constitutional:  Negative for chills and fever.  Respiratory:  Negative for shortness of breath.  Cardiovascular:  Negative for chest pain.  Gastrointestinal:  Negative for nausea and vomiting.  Musculoskeletal:  Negative for myalgias.  Neurological:  Negative for dizziness.   Denies any N/V, chest pain, shortness of breath, fevers/chills. All other ROS negative.  Vital Signs: There were no vitals taken for this visit.  Advance Care Plan: {Advance Care ZOXW:96045}    Physical Exam Vitals reviewed.  Constitutional:      Appearance: Normal appearance.  HENT:     Head: Normocephalic and atraumatic.     Mouth/Throat:     Mouth: Mucous membranes are moist.     Pharynx: Oropharynx is clear.  Pulmonary:     Effort: Pulmonary effort is normal.  Musculoskeletal:        General: Normal range of motion.     Cervical back: Normal range of motion.  Skin:    General: Skin is warm.  Neurological:     General: No focal deficit present.     Mental Status: She is alert and oriented to person, place, and time. Mental status is at baseline.  Psychiatric:        Mood and Affect: Mood normal.        Behavior: Behavior normal.        Judgment: Judgment normal.     Imaging: No results found.  Labs:  CBC: Recent Labs    06/13/22 0929  WBC 6.5  HGB 11.9  HCT 35.9  PLT 262    COAGS: No results for input(s): "INR", "APTT" in the last 8760 hours.  BMP: Recent Labs     06/13/22 0929 01/18/23 0907  NA 142 142  K 4.2 4.4  CL 101 103  CO2 23 21  GLUCOSE 123* 113*  BUN 16 24  CALCIUM 10.1 10.1  CREATININE 0.97 1.13*    LIVER FUNCTION TESTS: Recent Labs    06/13/22 0929  BILITOT 0.4  AST 16  ALT 15  ALKPHOS 59  PROT 6.7  ALBUMIN 4.4    TUMOR MARKERS: No results for input(s): "AFPTM", "CEA", "CA199", "CHROMGRNA" in the last 8760 hours.  Assessment and Plan:  68 y.o. female with a history of DM, HTN, OSA on CPAP, HLD, and Arthritis presenting due to nephrolithiasis. Patient has been following with Urology in [place] since November 2024 for 'known large renal calculi bilaterally,' none of which she has ever passed. CT Renal Stone Study on 04/12/23 significant for large partial staghorn calculi in both renal collecting systems without evidence of ureteral calculi or dilatation. Given recent complaints of mild, intermittent left flank pain, patient referred to IR for left percutaneous nephrostomy tube placement prior to nephrolithotomy on 1/130/25 with Dr. Ronne Binning.   ***  Risks and benefits of PCN placement was discussed with the patient including, but not limited to, infection, bleeding, significant bleeding causing loss or decrease in renal function or damage to adjacent structures.   All of the patient's questions were answered, patient is agreeable to proceed.  Consent signed and in chart.  Thank you for this interesting consult. I greatly enjoyed meeting MEGGAN DHALIWAL and look forward to participating in their care. A copy of this report was sent to the requesting provider on this date.  Electronically Signed: Jama Flavors, PA-C 05/21/2023, 12:07 PM   I spent a total of {New WUJW:[id number]} {New Out-Pt:[id number]}  {Established Out-Pt:[id number]} in face to face clinical consultation, greater than 50% of which was counseling/coordinating care for left percutaneous nephrostomy tube.

## 2023-05-22 ENCOUNTER — Ambulatory Visit (HOSPITAL_COMMUNITY)
Admission: RE | Admit: 2023-05-22 | Discharge: 2023-05-22 | Disposition: A | Discharge status: Home / Self Care | Payer: Medicare Other | Source: Ambulatory Visit | Attending: Urology | Admitting: Urology

## 2023-05-22 ENCOUNTER — Other Ambulatory Visit: Payer: Self-pay

## 2023-05-22 ENCOUNTER — Other Ambulatory Visit: Payer: Self-pay | Admitting: Urology

## 2023-05-22 ENCOUNTER — Encounter (HOSPITAL_COMMUNITY): Payer: Self-pay

## 2023-05-22 DIAGNOSIS — N200 Calculus of kidney: Secondary | ICD-10-CM | POA: Diagnosis not present

## 2023-05-22 DIAGNOSIS — E119 Type 2 diabetes mellitus without complications: Secondary | ICD-10-CM | POA: Diagnosis not present

## 2023-05-22 DIAGNOSIS — Z7985 Long-term (current) use of injectable non-insulin antidiabetic drugs: Secondary | ICD-10-CM | POA: Diagnosis not present

## 2023-05-22 DIAGNOSIS — Z87891 Personal history of nicotine dependence: Secondary | ICD-10-CM | POA: Diagnosis not present

## 2023-05-22 DIAGNOSIS — Z7984 Long term (current) use of oral hypoglycemic drugs: Secondary | ICD-10-CM | POA: Insufficient documentation

## 2023-05-22 DIAGNOSIS — G4733 Obstructive sleep apnea (adult) (pediatric): Secondary | ICD-10-CM | POA: Insufficient documentation

## 2023-05-22 DIAGNOSIS — I10 Essential (primary) hypertension: Secondary | ICD-10-CM | POA: Insufficient documentation

## 2023-05-22 DIAGNOSIS — E785 Hyperlipidemia, unspecified: Secondary | ICD-10-CM | POA: Insufficient documentation

## 2023-05-22 HISTORY — PX: IR URETERAL STENT RIGHT NEW ACCESS W/O SEP NEPHROSTOMY CATH: IMG6076

## 2023-05-22 LAB — BASIC METABOLIC PANEL
Anion gap: 9 (ref 5–15)
BUN: 18 mg/dL (ref 8–23)
CO2: 25 mmol/L (ref 22–32)
Calcium: 9.4 mg/dL (ref 8.9–10.3)
Chloride: 106 mmol/L (ref 98–111)
Creatinine, Ser: 0.84 mg/dL (ref 0.44–1.00)
GFR, Estimated: >60 mL/min (ref >60)
Glucose, Bld: 119 mg/dL — ABNORMAL HIGH (ref 70–99)
Potassium: 3.7 mmol/L (ref 3.5–5.1)
Sodium: 140 mmol/L (ref 135–145)

## 2023-05-22 MED ORDER — SODIUM CHLORIDE 0.9 % IV SOLN
INTRAVENOUS | Status: AC
  Filled 2023-05-22: qty 20

## 2023-05-22 MED ORDER — MIDAZOLAM HCL 2 MG/2ML IJ SOLN
INTRAMUSCULAR | Status: AC
  Filled 2023-05-22: qty 4

## 2023-05-22 MED ORDER — MIDAZOLAM HCL 2 MG/2ML IJ SOLN
INTRAMUSCULAR | Status: AC | PRN
  Administered 2023-05-22: 1 mg via INTRAVENOUS

## 2023-05-22 MED ORDER — FENTANYL CITRATE (PF) 100 MCG/2ML IJ SOLN
INTRAMUSCULAR | Status: AC | PRN
  Administered 2023-05-22: 50 ug via INTRAVENOUS

## 2023-05-22 MED ORDER — IOHEXOL 300 MG/ML  SOLN
50.0000 mL | Freq: Once | INTRAMUSCULAR | Status: AC | PRN
  Administered 2023-05-22: 20 mL

## 2023-05-22 MED ORDER — FENTANYL CITRATE (PF) 100 MCG/2ML IJ SOLN
INTRAMUSCULAR | Status: AC
  Filled 2023-05-22: qty 4

## 2023-05-22 MED ORDER — SODIUM CHLORIDE 0.9 % IV SOLN
2.0000 g | Freq: Once | INTRAVENOUS | Status: AC
  Administered 2023-05-22: 2 g via INTRAVENOUS
  Filled 2023-05-22: qty 20

## 2023-05-22 MED ORDER — FENTANYL CITRATE (PF) 100 MCG/2ML IJ SOLN
INTRAMUSCULAR | Status: AC | PRN
Start: 2023-05-22 — End: 2023-05-22
  Administered 2023-05-22: 25 ug via INTRAVENOUS

## 2023-05-22 MED ORDER — SODIUM CHLORIDE 0.9 % IV SOLN: INTRAVENOUS | Status: DC

## 2023-05-22 MED ORDER — LIDOCAINE-EPINEPHRINE 1 %-1:100000 IJ SOLN
INTRAMUSCULAR | Status: AC
  Filled 2023-05-22: qty 1

## 2023-05-22 MED ORDER — LIDOCAINE-EPINEPHRINE 1 %-1:100000 IJ SOLN
20.0000 mL | Freq: Once | INTRAMUSCULAR | Status: AC
  Administered 2023-05-22: 18 mL via INTRADERMAL

## 2023-05-22 NOTE — Procedures (Signed)
Interventional Radiology Procedure Note  Procedure: Image guided left nephroureteral catheter  Findings: The 28F catheter will accept any 035 wire to the bladder.  The catheter transmits via the inferior posterior calyx.  Complications: None  EBL: None    Recommendations: - Routine care.  - expect some bladder spasm/stent pain - on schedule for tomorrow OR with Urology - some hematuria would be expected - advance diet - 1 hr dc home  Signed,  Yvone Neu. Loreta Ave, DO

## 2023-05-22 NOTE — Discharge Instructions (Addendum)
Please call Interventional Radiology clinic [phone] with any questions or concerns.  You may remove your dressing and shower tomorrow.  After the procedure, it is common to have: Some soreness where the nephrostomy tube was inserted (tube insertion site) Blood-tinged drainage from the nephrostomy tube for the first 24 hours  Follow these instructions at home:  Medication: Do not use Aspirin or ibuprofen products, such as Advil or Motrin, as it may increase bleeding.  You may resume your usual medications as ordered by your doctor If your doctor prescribed antibiotics, take them as directed. Do not stop taking them just because you feel better. You need to take the full course of antibiotics.  Eating and drinking: Drink plenty of liquids to keep your urine pale yellow You can resume your regular diet as directed by your doctor  Care of the procedure site Follow instructions from your health care provider about how to take care of your tube insertion site. Make sure you: Wash your hands with soap and water for at least 20 seconds before you change your dressing. If soap and water are not available, use hand sanitizer. Change your dressing as told by your health care provider. Be careful not to pull on the tube while removing the dressing When you change the dressing, wash the skin around the tube, rinse well, and pat the skin dry Avoid using scissors to remove the dressing. Sharp objects may damage the catheter Check the tube insertion area every day for signs of infection. Check for: Redness, swelling, or pain Fluid or blood Warmth Pus or a bad smell  The nephrostomy tube will need to be changed every 8-12 weeks   Activity Do not lift anything that is heavier than 10 lb (4.5 kg), or the limit that you are told, until your health care provider says that it is safe Return to your normal activities as told by your health care provider.  Avoid activities that may cause the  nephrostomy tubing to bend Do not take baths, swim, or use a hot tub until your health care provider approves. Ask your health care provider if you can take showers.  Cover the nephrostomy tube bandage (dressing) with a watertight covering when you take a shower Keep all follow-up visits as told by your doctor  Contact a health care provider if: You have problems with any of the valves or tubing You have persistent pain or soreness in your back You have redness, swelling, or pain around your tube insertion site You have fluid or blood coming from your tube insertion site Your tube insertion site feels warm to the touch You have pus or a bad smell coming from your tube insertion site You have increased urine output or you feel burning when urinating  Get help right away if: You have pain in your abdomen during the first week You have chest pain or have trouble breathing You have a new appearance of blood in your urine You have a fever or chills You have back pain that is not relieved by your medicine You have decreased urine output Your nephrostomy tube comes out

## 2023-05-23 ENCOUNTER — Observation Stay (HOSPITAL_COMMUNITY)
Admission: RE | Admit: 2023-05-23 | Discharge: 2023-05-24 | Disposition: A | Discharge status: Home / Self Care | Payer: Medicare Other | Source: Ambulatory Visit | Attending: Urology | Admitting: Urology

## 2023-05-23 ENCOUNTER — Ambulatory Visit (HOSPITAL_BASED_OUTPATIENT_CLINIC_OR_DEPARTMENT_OTHER): Payer: Medicare Other | Admitting: Certified Registered Nurse Anesthetist

## 2023-05-23 ENCOUNTER — Ambulatory Visit (HOSPITAL_COMMUNITY): Payer: Medicare Other | Admitting: Certified Registered Nurse Anesthetist

## 2023-05-23 ENCOUNTER — Encounter (HOSPITAL_COMMUNITY)
Admission: RE | Disposition: A | Discharge status: Home / Self Care | Payer: Self-pay | Source: Ambulatory Visit | Attending: Urology

## 2023-05-23 ENCOUNTER — Encounter (HOSPITAL_COMMUNITY): Payer: Self-pay | Admitting: Urology

## 2023-05-23 ENCOUNTER — Ambulatory Visit (HOSPITAL_COMMUNITY): Payer: Medicare Other

## 2023-05-23 ENCOUNTER — Other Ambulatory Visit: Payer: Self-pay

## 2023-05-23 DIAGNOSIS — Z79899 Other long term (current) drug therapy: Secondary | ICD-10-CM | POA: Diagnosis not present

## 2023-05-23 DIAGNOSIS — Z7984 Long term (current) use of oral hypoglycemic drugs: Secondary | ICD-10-CM | POA: Diagnosis not present

## 2023-05-23 DIAGNOSIS — E119 Type 2 diabetes mellitus without complications: Secondary | ICD-10-CM | POA: Insufficient documentation

## 2023-05-23 DIAGNOSIS — I10 Essential (primary) hypertension: Secondary | ICD-10-CM | POA: Insufficient documentation

## 2023-05-23 DIAGNOSIS — N200 Calculus of kidney: Principal | ICD-10-CM | POA: Insufficient documentation

## 2023-05-23 DIAGNOSIS — Z87891 Personal history of nicotine dependence: Secondary | ICD-10-CM | POA: Diagnosis not present

## 2023-05-23 HISTORY — PX: NEPHROLITHOTOMY: SHX5134

## 2023-05-23 LAB — BASIC METABOLIC PANEL
Anion gap: 11 (ref 5–15)
BUN: 15 mg/dL (ref 8–23)
CO2: 23 mmol/L (ref 22–32)
Calcium: 9.3 mg/dL (ref 8.9–10.3)
Chloride: 105 mmol/L (ref 98–111)
Creatinine, Ser: 0.87 mg/dL (ref 0.44–1.00)
GFR, Estimated: >60 mL/min (ref >60)
Glucose, Bld: 149 mg/dL — ABNORMAL HIGH (ref 70–99)
Potassium: 3.3 mmol/L — ABNORMAL LOW (ref 3.5–5.1)
Sodium: 139 mmol/L (ref 135–145)

## 2023-05-23 LAB — CBC
HCT: 38.8 % (ref 36.0–46.0)
Hemoglobin: 12.5 g/dL (ref 12.0–15.0)
MCH: 27 pg (ref 26.0–34.0)
MCHC: 32.2 g/dL (ref 30.0–36.0)
MCV: 83.8 fL (ref 80.0–100.0)
Platelets: 197 10*3/uL (ref 150–400)
RBC: 4.63 MIL/uL (ref 3.87–5.11)
RDW: 15.2 % (ref 11.5–15.5)
WBC: 6 10*3/uL (ref 4.0–10.5)
nRBC: 0 % (ref 0.0–0.2)

## 2023-05-23 LAB — GLUCOSE, CAPILLARY
Glucose-Capillary: 117 mg/dL — ABNORMAL HIGH (ref 70–99)
Glucose-Capillary: 142 mg/dL — ABNORMAL HIGH (ref 70–99)

## 2023-05-23 SURGERY — NEPHROLITHOTOMY PERCUTANEOUS
Anesthesia: General | Site: Back | Laterality: Left

## 2023-05-23 MED ORDER — LACTATED RINGERS IV SOLN: INTRAVENOUS | Status: DC

## 2023-05-23 MED ORDER — SODIUM CHLORIDE 0.9 % IR SOLN
Status: DC | PRN
  Administered 2023-05-23: 15000 mL

## 2023-05-23 MED ORDER — OXYCODONE HCL 5 MG PO TABS: 5.0000 mg | ORAL_TABLET | ORAL | Status: DC | PRN

## 2023-05-23 MED ORDER — CHLORHEXIDINE GLUCONATE 0.12 % MT SOLN
15.0000 mL | Freq: Once | OROMUCOSAL | Status: DC
Start: 2023-05-23 — End: 2023-05-23

## 2023-05-23 MED ORDER — EPHEDRINE SULFATE (PRESSORS) 50 MG/ML IJ SOLN
INTRAMUSCULAR | Status: DC | PRN
  Administered 2023-05-23: 5 mg via INTRAVENOUS

## 2023-05-23 MED ORDER — LACTATED RINGERS IV SOLN: INTRAVENOUS | Status: DC | PRN

## 2023-05-23 MED ORDER — PHENYLEPHRINE 80 MCG/ML (10ML) SYRINGE FOR IV PUSH (FOR BLOOD PRESSURE SUPPORT)
PREFILLED_SYRINGE | INTRAVENOUS | Status: AC
Start: 2023-05-23 — End: ?
  Filled 2023-05-23: qty 20

## 2023-05-23 MED ORDER — DEXAMETHASONE SODIUM PHOSPHATE 10 MG/ML IJ SOLN
INTRAMUSCULAR | Status: DC | PRN
  Administered 2023-05-23: 8 mg via INTRAVENOUS

## 2023-05-23 MED ORDER — SENNOSIDES-DOCUSATE SODIUM 8.6-50 MG PO TABS
2.0000 | ORAL_TABLET | Freq: Every day | ORAL | Status: DC
  Administered 2023-05-23: 2 via ORAL
  Filled 2023-05-23: qty 2

## 2023-05-23 MED ORDER — DIPHENHYDRAMINE HCL 50 MG/ML IJ SOLN: 12.5000 mg | Freq: Four times a day (QID) | INTRAMUSCULAR | Status: DC | PRN

## 2023-05-23 MED ORDER — ONDANSETRON HCL 4 MG/2ML IJ SOLN: 4.0000 mg | Freq: Once | INTRAMUSCULAR | Status: DC | PRN

## 2023-05-23 MED ORDER — ORAL CARE MOUTH RINSE: 15.0000 mL | OROMUCOSAL | Status: DC | PRN

## 2023-05-23 MED ORDER — PROPOFOL 10 MG/ML IV BOLUS
INTRAVENOUS | Status: AC
  Filled 2023-05-23: qty 20

## 2023-05-23 MED ORDER — OXYCODONE HCL 5 MG PO TABS: 5.0000 mg | ORAL_TABLET | Freq: Once | ORAL | Status: DC | PRN

## 2023-05-23 MED ORDER — SUGAMMADEX SODIUM 200 MG/2ML IV SOLN
INTRAVENOUS | Status: DC | PRN
  Administered 2023-05-23: 200 mg via INTRAVENOUS

## 2023-05-23 MED ORDER — PHENYLEPHRINE HCL (PRESSORS) 10 MG/ML IV SOLN
INTRAVENOUS | Status: DC | PRN
  Administered 2023-05-23 (×2): 80 ug via INTRAVENOUS

## 2023-05-23 MED ORDER — ONDANSETRON HCL 4 MG/2ML IJ SOLN: 4.0000 mg | INTRAMUSCULAR | Status: DC | PRN

## 2023-05-23 MED ORDER — SODIUM CHLORIDE 0.9 % IV SOLN: INTRAVENOUS | Status: DC

## 2023-05-23 MED ORDER — PROPOFOL 10 MG/ML IV BOLUS
INTRAVENOUS | Status: DC | PRN
  Administered 2023-05-23: 50 mg via INTRAVENOUS
  Administered 2023-05-23: 150 mg via INTRAVENOUS

## 2023-05-23 MED ORDER — FENTANYL CITRATE PF 50 MCG/ML IJ SOSY
25.0000 ug | PREFILLED_SYRINGE | INTRAMUSCULAR | Status: DC | PRN
Start: 2023-05-23 — End: 2023-05-23

## 2023-05-23 MED ORDER — ACETAMINOPHEN 325 MG PO TABS
650.0000 mg | ORAL_TABLET | ORAL | Status: DC | PRN
Start: 2023-05-23 — End: 2023-05-24

## 2023-05-23 MED ORDER — ZOLPIDEM TARTRATE 5 MG PO TABS
5.0000 mg | ORAL_TABLET | Freq: Every evening | ORAL | Status: DC | PRN
Start: 2023-05-23 — End: 2023-05-24

## 2023-05-23 MED ORDER — OXYCODONE HCL 5 MG/5ML PO SOLN: 5.0000 mg | Freq: Once | ORAL | Status: DC | PRN

## 2023-05-23 MED ORDER — ROCURONIUM BROMIDE 10 MG/ML (PF) SYRINGE
PREFILLED_SYRINGE | INTRAVENOUS | Status: DC | PRN
  Administered 2023-05-23: 50 mg via INTRAVENOUS

## 2023-05-23 MED ORDER — EPHEDRINE 5 MG/ML INJ
INTRAVENOUS | Status: AC
Start: 2023-05-23 — End: ?
  Filled 2023-05-23: qty 5

## 2023-05-23 MED ORDER — DIATRIZOATE MEGLUMINE 30 % UR SOLN
URETHRAL | Status: AC
  Filled 2023-05-23: qty 100

## 2023-05-23 MED ORDER — FENTANYL CITRATE (PF) 100 MCG/2ML IJ SOLN
INTRAMUSCULAR | Status: DC | PRN
  Administered 2023-05-23 (×2): 50 ug via INTRAVENOUS

## 2023-05-23 MED ORDER — ORAL CARE MOUTH RINSE
15.0000 mL | Freq: Once | OROMUCOSAL | Status: DC
Start: 2023-05-23 — End: 2023-05-23

## 2023-05-23 MED ORDER — HYDROMORPHONE HCL 1 MG/ML IJ SOLN: 0.5000 mg | INTRAMUSCULAR | Status: DC | PRN

## 2023-05-23 MED ORDER — DIATRIZOATE MEGLUMINE 30 % UR SOLN
URETHRAL | Status: DC | PRN
  Administered 2023-05-23: 100 mL

## 2023-05-23 MED ORDER — ROCURONIUM BROMIDE 10 MG/ML (PF) SYRINGE
PREFILLED_SYRINGE | INTRAVENOUS | Status: AC
  Filled 2023-05-23: qty 10

## 2023-05-23 MED ORDER — FENTANYL CITRATE (PF) 100 MCG/2ML IJ SOLN
INTRAMUSCULAR | Status: AC
  Filled 2023-05-23: qty 2

## 2023-05-23 MED ORDER — DIPHENHYDRAMINE HCL 12.5 MG/5ML PO ELIX: 12.5000 mg | ORAL_SOLUTION | Freq: Four times a day (QID) | ORAL | Status: DC | PRN

## 2023-05-23 MED ORDER — DEXAMETHASONE SODIUM PHOSPHATE 10 MG/ML IJ SOLN
INTRAMUSCULAR | Status: AC
  Filled 2023-05-23: qty 1

## 2023-05-23 MED ORDER — CEFAZOLIN SODIUM-DEXTROSE 2-4 GM/100ML-% IV SOLN
2.0000 g | Freq: Three times a day (TID) | INTRAVENOUS | Status: AC
  Administered 2023-05-23 – 2023-05-24 (×2): 2 g via INTRAVENOUS
  Filled 2023-05-23 (×2): qty 100

## 2023-05-23 MED ORDER — CEFAZOLIN SODIUM-DEXTROSE 2-4 GM/100ML-% IV SOLN
2.0000 g | INTRAVENOUS | Status: AC
  Administered 2023-05-23: 2 g via INTRAVENOUS
  Filled 2023-05-23: qty 100

## 2023-05-23 MED ORDER — SERTRALINE HCL 50 MG PO TABS
100.0000 mg | ORAL_TABLET | Freq: Every day | ORAL | Status: DC
  Administered 2023-05-23 – 2023-05-24 (×2): 100 mg via ORAL
  Filled 2023-05-23 (×2): qty 2

## 2023-05-23 MED ORDER — ONDANSETRON HCL 4 MG/2ML IJ SOLN
INTRAMUSCULAR | Status: AC
Start: 2023-05-23 — End: ?
  Filled 2023-05-23: qty 2

## 2023-05-23 MED ORDER — ROSUVASTATIN CALCIUM 10 MG PO TABS
10.0000 mg | ORAL_TABLET | Freq: Every evening | ORAL | Status: DC
  Administered 2023-05-23: 10 mg via ORAL
  Filled 2023-05-23: qty 1

## 2023-05-23 MED ORDER — MIDAZOLAM HCL 2 MG/2ML IJ SOLN
INTRAMUSCULAR | Status: AC
  Filled 2023-05-23: qty 2

## 2023-05-23 MED ORDER — AMLODIPINE BESYLATE 5 MG PO TABS
5.0000 mg | ORAL_TABLET | Freq: Every evening | ORAL | Status: DC
  Administered 2023-05-23: 5 mg via ORAL
  Filled 2023-05-23: qty 1

## 2023-05-23 MED ORDER — CHLORHEXIDINE GLUCONATE 0.12 % MT SOLN
15.0000 mL | Freq: Once | OROMUCOSAL | Status: AC
  Administered 2023-05-23: 15 mL via OROMUCOSAL

## 2023-05-23 MED ORDER — ONDANSETRON HCL 4 MG/2ML IJ SOLN
INTRAMUSCULAR | Status: DC | PRN
  Administered 2023-05-23: 4 mg via INTRAVENOUS

## 2023-05-23 SURGICAL SUPPLY — 48 items
BAG URINE DRAIN 2000ML AR STRL (UROLOGICAL SUPPLIES) ×2 IMPLANT
BASKET ZERO TIP NITINOL 2.4FR (BASKET) ×1 IMPLANT
BENZOIN TINCTURE PRP APPL 2/3 (GAUZE/BANDAGES/DRESSINGS) ×4 IMPLANT
BLADE SURG 15 STRL LF DISP TIS (BLADE) ×2 IMPLANT
CATCHER STONE W/TUBE ADAPTER (UROLOGICAL SUPPLIES) ×1 IMPLANT
CATH FOLEY 2WAY SLVR 5CC 18FR (CATHETERS) ×1 IMPLANT
CATH INTERMIT  6FR 70CM (CATHETERS) ×2 IMPLANT
CATH UROLOGY TORQUE 65 (CATHETERS) ×2 IMPLANT
CATH X-FORCE N30 NEPHROSTOMY (TUBING) ×2 IMPLANT
CHLORAPREP W/TINT 26 (MISCELLANEOUS) ×1 IMPLANT
COUNTER NDL MAGNETIC 40 RED (SET/KITS/TRAYS/PACK) ×1 IMPLANT
COUNTER NEEDLE MAGNETIC 40 RED (SET/KITS/TRAYS/PACK) ×2
COVER LIGHT HANDLE STERIS (MISCELLANEOUS) ×4 IMPLANT
DRAPE C-ARM FOLDED MOBILE STRL (DRAPES) ×2 IMPLANT
DRAPE HALF SHEET 40X57 (DRAPES) ×2 IMPLANT
DRAPE LINGEMAN PERC (DRAPES) ×2 IMPLANT
DRSG PAD ABDOMINAL 8X10 ST (GAUZE/BANDAGES/DRESSINGS) ×4 IMPLANT
DRSG TEGADERM 8X12 (GAUZE/BANDAGES/DRESSINGS) ×5 IMPLANT
GAUZE PAD ABD 8X10 STRL (GAUZE/BANDAGES/DRESSINGS) ×2 IMPLANT
GAUZE SPONGE 4X4 12PLY STRL (GAUZE/BANDAGES/DRESSINGS) ×2 IMPLANT
GAUZE SPONGE 4X4 12PLY STRL LF (GAUZE/BANDAGES/DRESSINGS) ×2 IMPLANT
GLOVE BIO SURGEON STRL SZ8 (GLOVE) ×2 IMPLANT
GLOVE BIOGEL PI IND STRL 7.0 (GLOVE) ×4 IMPLANT
GLOVE BIOGEL PI IND STRL 8 (GLOVE) ×1 IMPLANT
GOWN STRL REUS W/TWL LRG LVL3 (GOWN DISPOSABLE) ×2 IMPLANT
GOWN STRL REUS W/TWL XL LVL3 (GOWN DISPOSABLE) ×2 IMPLANT
GUIDEWIRE AMPLAZ .035X145 (WIRE) ×2 IMPLANT
GUIDEWIRE STR DUAL SENSOR (WIRE) ×2 IMPLANT
IV NS 500ML BAXH (IV SOLUTION) ×2 IMPLANT
IV NS IRRIG 3000ML ARTHROMATIC (IV SOLUTION) ×7 IMPLANT
KIT PROBE TRILOGY 3.9X350 (MISCELLANEOUS) ×2 IMPLANT
KIT TURNOVER KIT A (KITS) ×2 IMPLANT
MANIFOLD NEPTUNE II (INSTRUMENTS) ×2 IMPLANT
PACK CYSTO (CUSTOM PROCEDURE TRAY) ×2 IMPLANT
PAD ARMBOARD 7.5X6 YLW CONV (MISCELLANEOUS) ×2 IMPLANT
POSITIONER HEAD 8X9X4 ADT (SOFTGOODS) ×2 IMPLANT
POSITIONER HEAD PRONE TRACH (MISCELLANEOUS) ×2 IMPLANT
SET AMPLATZ RENAL DILATOR (MISCELLANEOUS) ×1 IMPLANT
SET BASIN LINEN APH (SET/KITS/TRAYS/PACK) ×2 IMPLANT
SHEATH PEELAWAY SET 9 (SHEATH) ×2 IMPLANT
SPONGE DRAIN TRACH 4X4 STRL 2S (GAUZE/BANDAGES/DRESSINGS) ×3 IMPLANT
STENT URET 6FRX26 CONTOUR (STENTS) ×1 IMPLANT
STONE CATCHER W/TUBE ADAPTER (UROLOGICAL SUPPLIES) ×2
SUT SILK 2 0 SH (SUTURE) ×2 IMPLANT
SYR 50ML LL SCALE MARK (SYRINGE) ×2 IMPLANT
TRAY FOLEY W/BAG SLVR 16FR ST (SET/KITS/TRAYS/PACK) ×2 IMPLANT
TUBE CONNECTING 12X1/4 (SUCTIONS) ×4 IMPLANT
WATER STERILE IRR 1000ML POUR (IV SOLUTION) ×2 IMPLANT

## 2023-05-23 NOTE — Interval H&P Note (Signed)
History and Physical Interval Note:  05/23/2023 8:56 AM  Adrienne Davidson  has presented today for surgery, with the diagnosis of Left Nephrolithiasis.  The various methods of treatment have been discussed with the patient and family. After consideration of risks, benefits and other options for treatment, the patient has consented to  Procedure(s): NEPHROLITHOTOMY PERCUTANEOUS (Left) HOLMIUM LASER APPLICATION (Left) as a surgical intervention.  The patient's history has been reviewed, patient examined, no change in status, stable for surgery.  I have reviewed the patient's chart and labs.  Questions were answered to the patient's satisfaction.     Wilkie Aye

## 2023-05-23 NOTE — Anesthesia Preprocedure Evaluation (Signed)
Anesthesia Evaluation  Patient identified by MRN, date of birth, ID band Patient awake    Reviewed: Allergy & Precautions, H&P , NPO status , Patient's Chart, lab work & pertinent test results, reviewed documented beta blocker date and time   Airway Mallampati: II  TM Distance: >3 FB Neck ROM: full    Dental no notable dental hx.    Pulmonary neg pulmonary ROS, former smoker   Pulmonary exam normal breath sounds clear to auscultation       Cardiovascular Exercise Tolerance: Good hypertension, negative cardio ROS  Rhythm:regular Rate:Normal     Neuro/Psych   Anxiety     negative neurological ROS  negative psych ROS   GI/Hepatic negative GI ROS, Neg liver ROS,,,  Endo/Other  negative endocrine ROSdiabetes    Renal/GU negative Renal ROS  negative genitourinary   Musculoskeletal   Abdominal   Peds  Hematology negative hematology ROS (+)   Anesthesia Other Findings   Reproductive/Obstetrics negative OB ROS                             Anesthesia Physical Anesthesia Plan  ASA: 2  Anesthesia Plan: General and General ETT   Post-op Pain Management:    Induction:   PONV Risk Score and Plan: Ondansetron  Airway Management Planned:   Additional Equipment:   Intra-op Plan:   Post-operative Plan:   Informed Consent: I have reviewed the patients History and Physical, chart, labs and discussed the procedure including the risks, benefits and alternatives for the proposed anesthesia with the patient or authorized representative who has indicated his/her understanding and acceptance.     Dental Advisory Given  Plan Discussed with: CRNA  Anesthesia Plan Comments:        Anesthesia Quick Evaluation

## 2023-05-23 NOTE — Progress Notes (Signed)
Pt had nephrolithotomy drain placed in IR yesterday 05/22/2023. Dressing in place to left lower flank. Small amount of sanguinous drainage noted to dressing.

## 2023-05-23 NOTE — Transfer of Care (Signed)
Immediate Anesthesia Transfer of Care Note  Patient: Adrienne Davidson  Procedure(s) Performed: NEPHROLITHOTOMY PERCUTANEOUS (Left: Back) HOLMIUM LASER APPLICATION (Left)  Patient Location: PACU  Anesthesia Type:General  Level of Consciousness: awake, alert , oriented, and patient cooperative  Airway & Oxygen Therapy: Patient Spontanous Breathing and Patient connected to face mask oxygen  Post-op Assessment: Report given to RN, Post -op Vital signs reviewed and stable, and Patient moving all extremities X 4  Post vital signs: Reviewed and stable  Last Vitals:  Vitals Value Taken Time  BP 134/63 05/23/23 1304  Temp 36.4 C 05/23/23 1303  Pulse 86 05/23/23 1304  Resp 18 05/23/23 1304  SpO2 97 % 05/23/23 1304  Vitals shown include unfiled device data.  Last Pain:  Vitals:   05/23/23 0828  TempSrc: Oral  PainSc: 0-No pain         Complications: No notable events documented.

## 2023-05-23 NOTE — Op Note (Signed)
Preoperative diagnosis: Left renal stone  Postoperative diagnosis: Same  Procedure 1.  Left percutaneous nephrostolithotomy for stone greater than 2 cm 2.  Left nephrostogram 3.  Intraoperative fluoroscopy, under 1 hour, with interpretation 4.  Placement of a 6 x 26 double-J ureteral stent. 5.  Left ureteroscopy with lithotripsy and stone extraction 6.  Placement of a 80 French nephrostomy tube 7.  Dilation of percutaneous tract  Attending: Dr. Ronne Binning  Anesthesia: General  Estimated blood loss: Minimal  Antibiotics: ancef  Drains: 1.  16 French Foley catheter 2.  6 x 26 left double-J ureteral stent 3.  18 French nephrostomy tube  Specimens: Stone for analysis  Findings: 7cm staghorn left renal calculus  Indications: Patient is a 68 year old female with a history of large left renal stones.  After discussing treatment options and decided she was left percutaneous nephrostolithotomy.  Patient already has a nephrostomy tube.  Procedure in detail: Prior to procedure consent was obtained.  Patient was brought to the operating room debridement was done to ensure correct patient, correct procedure, and correct site.  General anesthesia was administered.  A 16 French Foley catheter was in place.  The patient was then placed in the prone position.  His nephrostomy tube and left flank was then prepped and draped in usual sterile fashion.  A nephrostogram was obtained and findings noted above.  Through the nephrostomy tube we then placed a sensor wire.  Sensor wire was coiled in the renal pelvis we then removed the nephrostomy tube.  We then made an incision at the level of the skin and over the wire we then placed a NephroMax dilator.  We dilated the nephrostomy tract to 30 [place] and held this 18 cm of water for 1 minute.  We then  placed the access sheath over the balloon.  The balloon was then deflated.  We then used a rigid nephroscope to perform nephroscopy.  We encountered a large renal  pelvis stone that was impacted at the ureteropelvic junction.  We then used a 200 nm laser fiber to fragment the stone in multiple pieces.  Using an in [facility] removed stone fragments and sent for composition analysis.  Once the majority of the stone was removed we then were able to perform ureteroscopy with a flexible ureteroscope.  We advanced ureteroscope down to the level of bladder.  We then placed a second wire through the ureteroscope into the bladder.  We then removed the ureteroscope and over the wire placed a 6 x 26 double-J ureteral stent.  The wire was then removed and good coil was noted in the renal pelvis under direct vision in the bladder under fluoroscopy.  We then placed a 18 French nephrostomy tube through the sheath into the renal pelvis.  The balloon was inflated with 3 amounts of contrast.  We then removed the access sheath in and obtain another nephrostogram.  We noted minimal extravasation of contrast.  We then secured the nephrostomy tubes with 0 silks in interrupted fashion.  Dressing was placed over the nephrostomy tube site and this then concluded the procedure was well-tolerated by the patient.  Complications: None  Condition: Stable, extubated, transferred to PACU  Plan: Patient is to be admitted overnight for observation.  Her Foley catheter through the morning.  She is then to be discharged home and followup in 3-5 days with CT stone study

## 2023-05-23 NOTE — Plan of Care (Signed)

## 2023-05-23 NOTE — Anesthesia Procedure Notes (Signed)
 Procedure Name: Intubation Date/Time: 05/23/2023 11:13 AM  Performed by: Anda Latina, RNPre-anesthesia Checklist: Patient identified, Emergency Drugs available, Suction available, Patient being monitored and Timeout performed Patient Re-evaluated:Patient Re-evaluated prior to induction Oxygen Delivery Method: Simple face mask Preoxygenation: Pre-oxygenation with 100% oxygen Induction Type: IV induction Ventilation: Two handed mask ventilation required Laryngoscope Size: Glidescope Grade View: Grade I Tube size: 7.0 mm Number of attempts: 2 Airway Equipment and Method: Stylet Placement Confirmation: ETT inserted through vocal cords under direct vision Secured at: 24 cm Tube secured with: Tape Dental Injury: Teeth and Oropharynx as per pre-operative assessment  Comments: SRNA DL'd with MAC 3, poor visualization. Used glidescope grade 1 view.

## 2023-05-24 ENCOUNTER — Other Ambulatory Visit: Payer: Self-pay | Admitting: Urology

## 2023-05-24 ENCOUNTER — Telehealth: Payer: Self-pay

## 2023-05-24 ENCOUNTER — Encounter (HOSPITAL_COMMUNITY): Payer: Self-pay | Admitting: Urology

## 2023-05-24 DIAGNOSIS — Z936 Other artificial openings of urinary tract status: Secondary | ICD-10-CM

## 2023-05-24 DIAGNOSIS — Z79899 Other long term (current) drug therapy: Secondary | ICD-10-CM | POA: Diagnosis not present

## 2023-05-24 DIAGNOSIS — N200 Calculus of kidney: Secondary | ICD-10-CM

## 2023-05-24 DIAGNOSIS — Z7984 Long term (current) use of oral hypoglycemic drugs: Secondary | ICD-10-CM | POA: Diagnosis not present

## 2023-05-24 DIAGNOSIS — E119 Type 2 diabetes mellitus without complications: Secondary | ICD-10-CM | POA: Diagnosis not present

## 2023-05-24 DIAGNOSIS — I10 Essential (primary) hypertension: Secondary | ICD-10-CM | POA: Diagnosis not present

## 2023-05-24 DIAGNOSIS — Z87891 Personal history of nicotine dependence: Secondary | ICD-10-CM | POA: Diagnosis not present

## 2023-05-24 LAB — CBC
HCT: 36.8 % (ref 36.0–46.0)
Hemoglobin: 11.7 g/dL — ABNORMAL LOW (ref 12.0–15.0)
MCH: 26.2 pg (ref 26.0–34.0)
MCHC: 31.8 g/dL (ref 30.0–36.0)
MCV: 82.3 fL (ref 80.0–100.0)
Platelets: 216 10*3/uL (ref 150–400)
RBC: 4.47 MIL/uL (ref 3.87–5.11)
RDW: 15.2 % (ref 11.5–15.5)
WBC: 8 10*3/uL (ref 4.0–10.5)
nRBC: 0 % (ref 0.0–0.2)

## 2023-05-24 LAB — BASIC METABOLIC PANEL
Anion gap: 10 (ref 5–15)
BUN: 15 mg/dL (ref 8–23)
CO2: 22 mmol/L (ref 22–32)
Calcium: 8.9 mg/dL (ref 8.9–10.3)
Chloride: 107 mmol/L (ref 98–111)
Creatinine, Ser: 0.83 mg/dL (ref 0.44–1.00)
GFR, Estimated: >60 mL/min (ref >60)
Glucose, Bld: 114 mg/dL — ABNORMAL HIGH (ref 70–99)
Potassium: 3.1 mmol/L — ABNORMAL LOW (ref 3.5–5.1)
Sodium: 139 mmol/L (ref 135–145)

## 2023-05-24 MED ORDER — OXYCODONE HCL 5 MG PO TABS: 5.0000 mg | ORAL_TABLET | ORAL | 0 refills | Status: DC | PRN

## 2023-05-24 NOTE — Plan of Care (Signed)
  Problem: Activity: Goal: Risk for activity intolerance will decrease Outcome: Progressing   Problem: Pain Managment: Goal: General experience of comfort will improve and/or be controlled Outcome: Progressing   Problem: Skin Integrity: Goal: Risk for impaired skin integrity will decrease Outcome: Progressing

## 2023-05-24 NOTE — Telephone Encounter (Signed)
Patient is made aware "Pt had PCNL yesterday. Per Dr. Ronne Binning: a) needs CT on Monday 05/27/23 b) cancel appt with me on Monday 05/27/23; reschedule as double book with me for Wednesday 05/29/23 at 9:30am c) please notify pt of these changes." Patient voiced understanding

## 2023-05-24 NOTE — TOC CM/SW Note (Signed)
Transition of Care [facility]) - Inpatient Brief Assessment   Patient Details  Name: Adrienne Davidson MRN: [id number] Date of Birth: 1955-07-22  Transition of Care [facility]) CM/SW Contact:    Isabella Bowens, LCSWA Phone Number: 05/24/2023, 9:14 AM   Clinical Narrative: Transition of Care Department [facility]) has reviewed patient and no TOC needs have been identified at this time. We will continue to monitor patient advancement through interdisciplinary progression rounds. If new patient transition needs arise, please place a TOC consult.   Transition of Care Asessment: Insurance and Status: Insurance coverage has been reviewed Patient has primary care physician: Yes Home environment has been reviewed: Single Family Home Prior level of function:: Independent Prior/Current Home Services: No current home services Social Drivers of Health Review: SDOH reviewed no interventions necessary Readmission risk has been reviewed: Yes Transition of care needs: no transition of care needs at this time

## 2023-05-25 NOTE — Anesthesia Postprocedure Evaluation (Signed)
Anesthesia Post Note  Patient: Adrienne Davidson  Procedure(s) Performed: NEPHROLITHOTOMY PERCUTANEOUS (Left: Back)  Patient location during evaluation: Phase II Anesthesia Type: General Level of consciousness: awake Pain management: pain level controlled Vital Signs Assessment: post-procedure vital signs reviewed and stable Respiratory status: spontaneous breathing and respiratory function stable Cardiovascular status: blood pressure returned to baseline and stable Postop Assessment: no headache and no apparent nausea or vomiting Anesthetic complications: no Comments: Late entry   No notable events documented.   Last Vitals:  Vitals:   05/23/23 2048 05/24/23 0501  BP: 102/62 (!) 155/64  Pulse: 88 77  Resp: 19 18  Temp: 36.7 C 36.7 C  SpO2: 96% 96%    Last Pain:  Vitals:   05/24/23 1043  TempSrc:   PainSc: 0-No pain                 Windell Norfolk

## 2023-05-27 ENCOUNTER — Encounter: Payer: Medicare Other | Admitting: Urology

## 2023-05-27 DIAGNOSIS — N200 Calculus of kidney: Secondary | ICD-10-CM

## 2023-05-27 DIAGNOSIS — Z09 Encounter for follow-up examination after completed treatment for conditions other than malignant neoplasm: Secondary | ICD-10-CM

## 2023-05-28 ENCOUNTER — Ambulatory Visit (HOSPITAL_COMMUNITY)
Admission: RE | Admit: 2023-05-28 | Discharge: 2023-05-28 | Disposition: A | Discharge status: Home / Self Care | Payer: Medicare Other | Source: Ambulatory Visit | Attending: Urology | Admitting: Urology

## 2023-05-28 DIAGNOSIS — Z936 Other artificial openings of urinary tract status: Secondary | ICD-10-CM | POA: Diagnosis not present

## 2023-05-28 DIAGNOSIS — K5730 Diverticulosis of large intestine without perforation or abscess without bleeding: Secondary | ICD-10-CM | POA: Diagnosis not present

## 2023-05-28 DIAGNOSIS — N200 Calculus of kidney: Secondary | ICD-10-CM | POA: Diagnosis not present

## 2023-05-28 NOTE — Discharge Summary (Signed)
Physician Discharge Summary  Patient ID: Adrienne Davidson MRN: [id number] DOB/AGE: Jul 02, 1955 68 y.o.  Admit date: 05/23/2023 Discharge date:05/24/2023  Admission Diagnoses:  Nephrolithiasis  Discharge Diagnoses:  Principal Problem:   Nephrolithiasis   Past Medical History:  Diagnosis Date   Allergic rhinitis    Allergy    Anxiety    Arthritis    Diabetes mellitus (HCC)    Hyperlipidemia    Hypertension    Psoriasis    Bilateral feet   Sleep apnea    Sleep apnea    Vitamin D deficiency     Surgeries: Procedure(s): NEPHROLITHOTOMY PERCUTANEOUS on 05/23/2023   Consultants (if any):   Discharged Condition: Improved  Hospital Course: Adrienne Davidson is an 68 y.o. female who was admitted 05/23/2023 with a diagnosis of Nephrolithiasis and went to the operating room on 05/23/2023 and underwent the above named procedures.    She was given perioperative antibiotics:  Anti-infectives (From admission, onward)    Start     Dose/Rate Route Frequency Ordered Stop   05/23/23 2000  ceFAZolin (ANCEF) IVPB 2g/100 mL premix        2 g 200 mL/hr over 30 Minutes Intravenous Every 8 hours 05/23/23 1510 05/24/23 0534   05/23/23 0808  ceFAZolin (ANCEF) IVPB 2g/100 mL premix        2 g 200 mL/hr over 30 Minutes Intravenous 30 min pre-op 05/23/23 0808 05/23/23 1107     .  She was given sequential compression devices, early ambulation for DVT prophylaxis.  She benefited maximally from the hospital stay and there were no complications.    Recent vital signs:  Vitals:   05/23/23 2048 05/24/23 0501  BP: 102/62 (!) 155/64  Pulse: 88 77  Resp: 19 18  Temp: 98.1 F (36.7 C) 98 F (36.7 C)  SpO2: 96% 96%    Recent laboratory studies:  Lab Results  Component Value Date   HGB 11.7 (L) 05/24/2023   HGB 12.5 05/23/2023   HGB 12.5 05/21/2023   Lab Results  Component Value Date   WBC 8.0 05/24/2023   PLT 216 05/24/2023   Lab Results  Component Value Date   INR 0.9 05/21/2023    Lab Results  Component Value Date   NA 139 05/24/2023   K 3.1 (L) 05/24/2023   CL 107 05/24/2023   CO2 22 05/24/2023   BUN 15 05/24/2023   CREATININE 0.83 05/24/2023   GLUCOSE 114 (H) 05/24/2023    Discharge Medications:   Allergies as of 05/24/2023       Reactions   Enalapril Anaphylaxis   Angioedema Aug 2018        Medication List     TAKE these medications    acetaminophen 500 MG tablet Commonly known as: TYLENOL Take 500-1,000 mg by mouth every 6 (six) hours as needed (pain.).   amLODipine 5 MG tablet Commonly known as: NORVASC Take 1 tablet (5 mg total) by mouth daily. What changed: when to take this   Coenzyme Q10 200 MG capsule Take 200 mg by mouth at bedtime.   CRANBERRY PO Take 500 mg by mouth at bedtime.   D-3-5 125 MCG (5000 UT) capsule Generic drug: Cholecalciferol Take 5,000 Units by mouth in the morning.   dapagliflozin propanediol 5 MG Tabs tablet Commonly known as: Farxiga Take 1 tablet (5 mg total) by mouth daily.   EPINEPHrine 0.3 mg/0.3 mL Soaj injection Commonly known as: EPI-PEN Inject 0.3 mg into the muscle as needed for anaphylaxis. Then call  911   glucose blood test strip Test blood sugar once daily (one touch verio if covered by ins/ otherwise whatever ins preference is) E11.65   Krill Oil 500 MG Caps Take 500 mg by mouth in the morning.   LORazepam 0.5 MG tablet Commonly known as: ATIVAN Take 0.5-1 tablets (0.25-0.5 mg total) by mouth 2 (two) times daily as needed for anxiety.   metFORMIN 1000 MG tablet Commonly known as: GLUCOPHAGE Take 1 tablet (1,000 mg total) by mouth 2 (two) times daily with a meal.   multivitamin with minerals Tabs tablet Take 1 tablet by mouth in the morning.   onetouch ultrasoft lancets Use once daily as instructed E11.65 (one touch verio)   oxyCODONE 5 MG immediate release tablet Commonly known as: Oxy IR/ROXICODONE Take 1 tablet (5 mg total) by mouth every 4 (four) hours as needed for  moderate pain (pain score 4-6).   rosuvastatin 10 MG tablet Commonly known as: CRESTOR Take 1 tablet (10 mg total) by mouth daily. What changed: when to take this   sertraline 100 MG tablet Commonly known as: ZOLOFT Take 1 tablet (100 mg total) by mouth daily.   Trulicity 3 MG/0.5ML Soaj Generic drug: Dulaglutide Inject 3 mg as directed once a week. What changed: when to take this        Diagnostic Studies: DG C-Arm 1-60 Min-No Report Result Date: 05/23/2023 Fluoroscopy was utilized by the requesting physician.  No radiographic interpretation.   IR URETERAL STENT RIGHT NEW ACCESS W/O SEP NEPHROSTOMY CATH Result Date: 05/22/2023 INDICATION: 68 year old female with nephrolithiasis, presents for preoperative nephroureteral drain for antegrade nephrolithotomy EXAM: IR NEPHROSTOMY PLACEMENT LEFT COMPARISON:  CT 04/12/2023 MEDICATIONS: None ANESTHESIA/SEDATION: Fentanyl 125 mcg IV; Versed 2.0 mg IV Moderate Sedation Time:  30 minutes The patient was continuously monitored during the procedure by the interventional radiology nurse under my direct supervision. CONTRAST:  20mL OMNIPAQUE IOHEXOL 300 MG/ML SOLN - administered into the collecting system(s) FLUOROSCOPY TIME:  Fluoroscopy Time:  minutes  seconds ( mGy). COMPLICATIONS: None immediate. PROCEDURE: Informed written consent was obtained from the patient after a thorough discussion of the procedural risks, benefits and alternatives. All questions were addressed. Maximal Sterile Barrier Technique was utilized including caps, mask, sterile gowns, sterile gloves, sterile drape, hand hygiene and skin antiseptic. A timeout was performed prior to the initiation of the procedure. Patient positioned prone position on the fluoroscopy table. Ultrasound survey of the left flank was performed with images stored and sent to PACs. 1% lidocaine was used to anesthetize the skin and subcutaneous tissues for local anesthesia. A Chiba needle was then used to  access a posterior inferior calyx containing the greatest burden of stone disease with ultrasound guidance. Small amount of contrast confirmed the location. An 018 micro wire into the collecting system was performed under fluoroscopy. A small incision was made with an 11 blade scalpel, and the needle was removed from the wire. An Accustick system was then advanced over the wire into the collecting system under fluoroscopy. The wire would not initially traversed the collecting system into the ureter. Once the Accustick system was within the collecting system the wire, inner dilator, and metal stiffener were removed. Contrast was injected through the sheath confirming location in the collecting system. A stiff Glidewire would not advanced beyond the stone at the ureteropelvic junction. An 20 J wire was then passed through the 4 [place] Accustick sheath into the upper pole collecting system and the sheath was removed. An angled Kum[place] the catheter was  then navigated on the J wire into the collecting system. J wire was removed. The Kumpe the catheter was then used to navigate the Glidewire into the ureter. The catheter and Glidewire were then navigated to the urinary bladder. Once the catheter was within the bladder the wire was removed and contrast confirmed location. Obliquities were performed at the access point. The antegrade nephroureteral drain was capped and sutured in location. Patient tolerated the procedure well and remained hemodynamically stable throughout. No complications were encountered and no significant blood loss encountered IMPRESSION: Status post image guided left nephroureteral drain for pending antegrade nephrolithotomy Signed, Yvone Neu. Miachel Roux, RPVI [facility] Radiology PLAN: The drain will accept any 035 wire to the urinary bladder Electronically Signed   By: Gilmer Mor D.O.   On: 05/22/2023 12:35    Disposition: Discharge  disposition: 01-Home or Self Care       Discharge Instructions     Discharge patient   Complete by: As directed    Discharge disposition: 01-Home or Self Care   Discharge patient date: 05/24/2023        Follow-up Information     Malen Gauze, MD. Call on 06/17/2023.   Specialty: Urology Contact information: [address]  [place] [address] [phone]                  Signed: Wilkie Aye 05/28/2023, 8:08 AM

## 2023-05-29 ENCOUNTER — Ambulatory Visit: Payer: Medicare Other | Admitting: Urology

## 2023-05-29 VITALS — BP 147/68 | HR 92

## 2023-05-29 DIAGNOSIS — N200 Calculus of kidney: Secondary | ICD-10-CM

## 2023-05-29 LAB — CALCULI, WITH PHOTOGRAPH (CLINICAL LAB)
Calcium Oxalate Dihydrate: 20 %
Calcium Oxalate Monohydrate: 80 %
Weight Calculi: 8929 mg

## 2023-05-29 NOTE — Progress Notes (Signed)
 05/29/2023 9:06 AM   Adrien ONEIDA Lamer 16-Jan-1956 [id number]  Referring provider: [name], DO [address] [place],  [place] [address]    HPI: Ms Bartl is a [phone] here for followup after PCNL. CT shows upper pole residual calculus. She denies nay significant flank pain. No hematuria. NO worsening LUTS   PMH: Past Medical History:  Diagnosis Date   Allergic rhinitis    Allergy    Anxiety    Arthritis    Diabetes mellitus (HCC)    Hyperlipidemia    Hypertension    Psoriasis    Bilateral feet   Sleep apnea    Sleep apnea    Vitamin D  deficiency     Surgical History: Past Surgical History:  Procedure Laterality Date   APPENDECTOMY     CESAREAN SECTION  1980   CESAREAN SECTION  1984   IR URETERAL STENT RIGHT NEW ACCESS W/O SEP NEPHROSTOMY CATH  05/22/2023   NEPHROLITHOTOMY Left 05/23/2023   Procedure: NEPHROLITHOTOMY PERCUTANEOUS;  Surgeon: Sherrilee Belvie CROME, MD;  Location: [facility];  Service: Urology;  Laterality: Left;   TONSILLECTOMY      Home Medications:  Allergies as of 05/29/2023       Reactions   Enalapril Anaphylaxis   Angioedema Aug 2018        Medication List        Accurate as of May 29, 2023  9:06 AM. If you have any questions, ask your nurse or doctor.          acetaminophen  500 MG tablet Commonly known as: TYLENOL  Take 500-1,000 mg by mouth every 6 (six) hours as needed (pain.).   amLODipine  5 MG tablet Commonly known as: NORVASC  Take 1 tablet (5 mg total) by mouth daily. What changed: when to take this   Coenzyme Q10 200 MG capsule Take 200 mg by mouth at bedtime.   CRANBERRY PO Take 500 mg by mouth at bedtime.   D-3-5 125 MCG (5000 UT) capsule Generic drug: Cholecalciferol Take 5,000 Units by mouth in the morning.   dapagliflozin  propanediol 5 MG Tabs tablet Commonly known as: Farxiga  Take 1 tablet (5 mg total) by mouth daily.   EPINEPHrine  0.3 mg/0.3 mL Soaj injection Commonly known as: EPI-PEN Inject 0.3  mg into the muscle as needed for anaphylaxis. Then call 911   glucose blood test strip Test blood sugar once daily (one touch verio if covered by ins/ otherwise whatever ins preference is) E11.65   Krill Oil 500 MG Caps Take 500 mg by mouth in the morning.   LORazepam  0.5 MG tablet Commonly known as: ATIVAN  Take 0.5-1 tablets (0.25-0.5 mg total) by mouth 2 (two) times daily as needed for anxiety.   metFORMIN  1000 MG tablet Commonly known as: GLUCOPHAGE  Take 1 tablet (1,000 mg total) by mouth 2 (two) times daily with a meal.   multivitamin with minerals Tabs tablet Take 1 tablet by mouth in the morning.   onetouch ultrasoft lancets Use once daily as instructed E11.65 (one touch verio)   oxyCODONE  5 MG immediate release tablet Commonly known as: Oxy IR/ROXICODONE  Take 1 tablet (5 mg total) by mouth every 4 (four) hours as needed for moderate pain (pain score 4-6).   rosuvastatin  10 MG tablet Commonly known as: CRESTOR  Take 1 tablet (10 mg total) by mouth daily. What changed: when to take this   sertraline  100 MG tablet Commonly known as: ZOLOFT  Take 1 tablet (100 mg total) by mouth daily.   Trulicity   3 MG/0.5ML Soaj Generic drug: Dulaglutide  Inject 3 mg as directed once a week. What changed: when to take this        Allergies:  Allergies  Allergen Reactions   Enalapril Anaphylaxis    Angioedema Aug 2018    Family History: Family History  Problem Relation Age of Onset   Non-Hodgkin's lymphoma Mother    Arthritis Mother    Cancer Mother    Heart disease Father    Arthritis Father    COPD Father    Hypertension Father    Diabetes Daughter    Rheum arthritis Daughter    Polycystic ovary syndrome Daughter    Breast cancer Neg Hx     Social History:  reports that she quit smoking about 4 years ago. Her smoking use included cigarettes. She started smoking about 29 years ago. She has a 6.3 pack-year smoking history. She has never used smokeless tobacco. She  reports that she does not currently use alcohol. She reports that she does not currently use drugs.  ROS: All other review of systems were reviewed and are negative except what is noted above in HPI  Physical Exam: BP (!) 147/68   Pulse 92   Constitutional:  Alert and oriented, No acute distress. HEENT: [place] AT, moist mucus membranes.  Trachea midline, no masses. Cardiovascular: No clubbing, cyanosis, or edema. Respiratory: Normal respiratory effort, no increased work of breathing. GI: Abdomen is soft, nontender, nondistended, no abdominal masses GU: No CVA tenderness.  Lymph: No cervical or inguinal lymphadenopathy. Skin: No rashes, bruises or suspicious lesions. Neurologic: Grossly intact, no focal deficits, moving all 4 extremities. Psychiatric: Normal mood and affect.  Laboratory Data: Lab Results  Component Value Date   WBC 8.0 05/24/2023   HGB 11.7 (L) 05/24/2023   HCT 36.8 05/24/2023   MCV 82.3 05/24/2023   PLT 216 05/24/2023    Lab Results  Component Value Date   CREATININE 0.83 05/24/2023    No results found for: PSA  No results found for: TESTOSTERONE  Lab Results  Component Value Date   HGBA1C 6.7 (H) 05/21/2023    Urinalysis    Component Value Date/Time   APPEARANCEUR Clear 04/25/2023 1029   GLUCOSEU 3+ (A) 04/25/2023 1029   BILIRUBINUR Negative 04/25/2023 1029   PROTEINUR 1+ (A) 04/25/2023 1029   NITRITE Negative 04/25/2023 1029   LEUKOCYTESUR 2+ (A) 04/25/2023 1029    Lab Results  Component Value Date   LABMICR See below: 04/25/2023   WBCUA >30 (A) 04/25/2023   LABEPIT 0-10 04/25/2023   BACTERIA Moderate (A) 04/25/2023    Pertinent Imaging:  No results found for this or any previous visit.  No results found for this or any previous visit.  No results found for this or any previous visit.  No results found for this or any previous visit.  No results found for this or any previous visit.  No results found for this or any previous  visit.  No results found for this or any previous visit.  Results for orders placed during the hospital encounter of 05/28/23  CT RENAL STONE STUDY  Narrative CLINICAL DATA:  68 year old female with staghorn calculi, left nephrostomy.  EXAM: CT ABDOMEN AND PELVIS WITHOUT CONTRAST  TECHNIQUE: Multidetector CT imaging of the abdomen and pelvis was performed following the standard protocol without IV contrast.  RADIATION DOSE REDUCTION: This exam was performed according to the departmental dose-optimization program which includes automated exposure control, adjustment of the mA and/or kV according to patient  size and/or use of iterative reconstruction technique.  COMPARISON:  CT Abdomen and Pelvis 04/12/2023.  FINDINGS: Lower chest: Heart size remains normal. No pericardial or pleural effusion. Negative lung bases.  Hepatobiliary: Negative noncontrast liver and gallbladder.  Pancreas: Negative.  Spleen: Negative.  Adrenals/Urinary Tract: Normal adrenal glands.  Large right mid and lower pole staghorn renal calculi appears stable since December, up to 3 cm. Right kidney and ureter appear nonobstructed.  New percutaneous left nephrostomy tube, which enters the left lower pole collecting system, and superimposed right double-J ureteral stent. The proximal stent pigtail appears formed mostly in the proximal ureter just below the ureteropelvic junction (coronal image 49). New left renal collecting system gas. Underlying regressed but not resolved widespread left renal staghorn calculi. Mild new left pararenal space inflammation, and trace mildly complex pararenal space fluid (series 2, image 33). The left ureter is nondilated along the course of the stent. There is redundant distal ureteral stent in the urinary bladder with a small volume of gas. No calculus identified along the course of the stent.  Stomach/Bowel: Nondilated large and small bowel.  Sigmoid diverticulosis redemonstrated. Evidence of previous appendectomy. No free air, free fluid, or bowel inflammation identified. Moderate retained food and fluid in the stomach.  Vascular/Lymphatic: Severe Aortoiliac calcified atherosclerosis. Vascular patency is not evaluated in the absence of IV contrast. Normal caliber abdominal aorta. No lymphadenopathy identified.  Reproductive: Negative noncontrast appearance.  Other: No pelvis free fluid.  Musculoskeletal: Severe lumbar spine degeneration superimposed on degenerative appearing ankylosis of the lumbosacral junction. Flowing lower thoracic endplate osteophytes and ankylosis. Chronic pubic symphysis degeneration. No acute osseous abnormality identified.  IMPRESSION: 1. New left nephrostomy tube which accesses the left lower pole collecting system. Superimposed double-J left ureteral stent may have migrated distally since initial placement: The proximal stent pigtail is formed more in the proximal ureter than the UPJ. Trace left pararenal space blood or complex fluid.  2. Gas in the left renal collecting system with regressed but not resolved extensive left kidney staghorn calculi. Nondilated left ureter. Stable contralateral nonobstructing right renal staghorn calculi  3. No other acute or inflammatory process identified in the noncontrast abdomen or pelvis. Advanced aortic Atherosclerosis (ICD10-I70.0). Advanced spine degeneration.   Electronically Signed By: VEAR Hurst M.D. On: 05/28/2023 11:40   Assessment & Plan:    1. Staghorn kidney stones (Primary) -schedule for second look PCNL - Ambulatory Referral For Surgery Scheduling   No follow-ups on file.  [name], MD  [facility] [place]

## 2023-05-29 NOTE — H&P (View-Only) (Signed)
 05/29/2023 9:06 AM   Norwood Levo [birth year] [id number]  Referring provider: Raliegh Ip, DO [address] [place],  [place] [address]    HPI: Ms Adrienne Davidson is a [phone] here for followup after PCNL. CT shows upper pole residual calculus. She denies nay significant flank pain. No hematuria. NO worsening LUTS   PMH: Past Medical History:  Diagnosis Date   Allergic rhinitis    Allergy    Anxiety    Arthritis    Diabetes mellitus (HCC)    Hyperlipidemia    Hypertension    Psoriasis    Bilateral feet   Sleep apnea    Sleep apnea    Vitamin D deficiency     Surgical History: Past Surgical History:  Procedure Laterality Date   APPENDECTOMY     CESAREAN SECTION  1980   CESAREAN SECTION  1984   IR URETERAL STENT RIGHT NEW ACCESS W/O SEP NEPHROSTOMY CATH  05/22/2023   NEPHROLITHOTOMY Left 05/23/2023   Procedure: NEPHROLITHOTOMY PERCUTANEOUS;  Surgeon: Malen Gauze, MD;  Location: [facility];  Service: Urology;  Laterality: Left;   TONSILLECTOMY      Home Medications:  Allergies as of 05/29/2023       Reactions   Enalapril Anaphylaxis   Angioedema Aug 2018        Medication List        Accurate as of May 29, 2023  9:06 AM. If you have any questions, ask your nurse or doctor.          acetaminophen 500 MG tablet Commonly known as: TYLENOL Take 500-1,000 mg by mouth every 6 (six) hours as needed (pain.).   amLODipine 5 MG tablet Commonly known as: NORVASC Take 1 tablet (5 mg total) by mouth daily. What changed: when to take this   Coenzyme Q10 200 MG capsule Take 200 mg by mouth at bedtime.   CRANBERRY PO Take 500 mg by mouth at bedtime.   D-3-5 125 MCG (5000 UT) capsule Generic drug: Cholecalciferol Take 5,000 Units by mouth in the morning.   dapagliflozin propanediol 5 MG Tabs tablet Commonly known as: Farxiga Take 1 tablet (5 mg total) by mouth daily.   EPINEPHrine 0.3 mg/0.3 mL Soaj injection Commonly known as: EPI-PEN Inject 0.3  mg into the muscle as needed for anaphylaxis. Then call 911   glucose blood test strip Test blood sugar once daily (one touch verio if covered by ins/ otherwise whatever ins preference is) E11.65   Krill Oil 500 MG Caps Take 500 mg by mouth in the morning.   LORazepam 0.5 MG tablet Commonly known as: ATIVAN Take 0.5-1 tablets (0.25-0.5 mg total) by mouth 2 (two) times daily as needed for anxiety.   metFORMIN 1000 MG tablet Commonly known as: GLUCOPHAGE Take 1 tablet (1,000 mg total) by mouth 2 (two) times daily with a meal.   multivitamin with minerals Tabs tablet Take 1 tablet by mouth in the morning.   onetouch ultrasoft lancets Use once daily as instructed E11.65 (one touch verio)   oxyCODONE 5 MG immediate release tablet Commonly known as: Oxy IR/ROXICODONE Take 1 tablet (5 mg total) by mouth every 4 (four) hours as needed for moderate pain (pain score 4-6).   rosuvastatin 10 MG tablet Commonly known as: CRESTOR Take 1 tablet (10 mg total) by mouth daily. What changed: when to take this   sertraline 100 MG tablet Commonly known as: ZOLOFT Take 1 tablet (100 mg total) by mouth daily.   Trulicity  3 MG/0.5ML Soaj Generic drug: Dulaglutide  Inject 3 mg as directed once a week. What changed: when to take this        Allergies:  Allergies  Allergen Reactions   Enalapril Anaphylaxis    Angioedema Aug 2018    Family History: Family History  Problem Relation Age of Onset   Non-Hodgkin's lymphoma Mother    Arthritis Mother    Cancer Mother    Heart disease Father    Arthritis Father    COPD Father    Hypertension Father    Diabetes Daughter    Rheum arthritis Daughter    Polycystic ovary syndrome Daughter    Breast cancer Neg Hx     Social History:  reports that she quit smoking about 4 years ago. Her smoking use included cigarettes. She started smoking about 29 years ago. She has a 6.3 pack-year smoking history. She has never used smokeless tobacco. She  reports that she does not currently use alcohol. She reports that she does not currently use drugs.  ROS: All other review of systems were reviewed and are negative except what is noted above in HPI  Physical Exam: BP (!) 147/68   Pulse 92   Constitutional:  Alert and oriented, No acute distress. HEENT: Leonardo AT, moist mucus membranes.  Trachea midline, no masses. Cardiovascular: No clubbing, cyanosis, or edema. Respiratory: Normal respiratory effort, no increased work of breathing. GI: Abdomen is soft, nontender, nondistended, no abdominal masses GU: No CVA tenderness.  Lymph: No cervical or inguinal lymphadenopathy. Skin: No rashes, bruises or suspicious lesions. Neurologic: Grossly intact, no focal deficits, moving all 4 extremities. Psychiatric: Normal mood and affect.  Laboratory Data: Lab Results  Component Value Date   WBC 8.0 05/24/2023   HGB 11.7 (L) 05/24/2023   HCT 36.8 05/24/2023   MCV 82.3 05/24/2023   PLT 216 05/24/2023    Lab Results  Component Value Date   CREATININE 0.83 05/24/2023    No results found for: PSA  No results found for: TESTOSTERONE  Lab Results  Component Value Date   HGBA1C 6.7 (H) 05/21/2023    Urinalysis    Component Value Date/Time   APPEARANCEUR Clear 04/25/2023 1029   GLUCOSEU 3+ (A) 04/25/2023 1029   BILIRUBINUR Negative 04/25/2023 1029   PROTEINUR 1+ (A) 04/25/2023 1029   NITRITE Negative 04/25/2023 1029   LEUKOCYTESUR 2+ (A) 04/25/2023 1029    Lab Results  Component Value Date   LABMICR See below: 04/25/2023   WBCUA >30 (A) 04/25/2023   LABEPIT 0-10 04/25/2023   BACTERIA Moderate (A) 04/25/2023    Pertinent Imaging:  No results found for this or any previous visit.  No results found for this or any previous visit.  No results found for this or any previous visit.  No results found for this or any previous visit.  No results found for this or any previous visit.  No results found for this or any previous  visit.  No results found for this or any previous visit.  Results for orders placed during the hospital encounter of 05/28/23  CT RENAL STONE STUDY  Narrative CLINICAL DATA:  68 year old female with staghorn calculi, left nephrostomy.  EXAM: CT ABDOMEN AND PELVIS WITHOUT CONTRAST  TECHNIQUE: Multidetector CT imaging of the abdomen and pelvis was performed following the standard protocol without IV contrast.  RADIATION DOSE REDUCTION: This exam was performed according to the departmental dose-optimization program which includes automated exposure control, adjustment of the mA and/or kV according to patient  size and/or use of iterative reconstruction technique.  COMPARISON:  CT Abdomen and Pelvis 04/12/2023.  FINDINGS: Lower chest: Heart size remains normal. No pericardial or pleural effusion. Negative lung bases.  Hepatobiliary: Negative noncontrast liver and gallbladder.  Pancreas: Negative.  Spleen: Negative.  Adrenals/Urinary Tract: Normal adrenal glands.  Large right mid and lower pole staghorn renal calculi appears stable since December, up to 3 cm. Right kidney and ureter appear nonobstructed.  New percutaneous left nephrostomy tube, which enters the left lower pole collecting system, and superimposed right double-J ureteral stent. The proximal stent pigtail appears formed mostly in the proximal ureter just below the ureteropelvic junction (coronal image 49). New left renal collecting system gas. Underlying regressed but not resolved widespread left renal staghorn calculi. Mild new left pararenal space inflammation, and trace mildly complex pararenal space fluid (series 2, image 33). The left ureter is nondilated along the course of the stent. There is redundant distal ureteral stent in the urinary bladder with a small volume of gas. No calculus identified along the course of the stent.  Stomach/Bowel: Nondilated large and small bowel.  Sigmoid diverticulosis redemonstrated. Evidence of previous appendectomy. No free air, free fluid, or bowel inflammation identified. Moderate retained food and fluid in the stomach.  Vascular/Lymphatic: Severe Aortoiliac calcified atherosclerosis. Vascular patency is not evaluated in the absence of IV contrast. Normal caliber abdominal aorta. No lymphadenopathy identified.  Reproductive: Negative noncontrast appearance.  Other: No pelvis free fluid.  Musculoskeletal: Severe lumbar spine degeneration superimposed on degenerative appearing ankylosis of the lumbosacral junction. Flowing lower thoracic endplate osteophytes and ankylosis. Chronic pubic symphysis degeneration. No acute osseous abnormality identified.  IMPRESSION: 1. New left nephrostomy tube which accesses the left lower pole collecting system. Superimposed double-J left ureteral stent may have migrated distally since initial placement: The proximal stent pigtail is formed more in the proximal ureter than the UPJ. Trace left pararenal space blood or complex fluid.  2. Gas in the left renal collecting system with regressed but not resolved extensive left kidney staghorn calculi. Nondilated left ureter. Stable contralateral nonobstructing right renal staghorn calculi  3. No other acute or inflammatory process identified in the noncontrast abdomen or pelvis. Advanced aortic Atherosclerosis (ICD10-I70.0). Advanced spine degeneration.   Electronically Signed By: Odessa Fleming M.D. On: 05/28/2023 11:40   Assessment & Plan:    1. Staghorn kidney stones (Primary) -schedule for second look PCNL - Ambulatory Referral For Surgery Scheduling   No follow-ups on file.  Wilkie Aye, MD  [facility] [place]

## 2023-06-04 ENCOUNTER — Encounter: Payer: Self-pay | Admitting: Urology

## 2023-06-04 NOTE — Patient Instructions (Signed)

## 2023-06-20 ENCOUNTER — Encounter (HOSPITAL_COMMUNITY): Payer: Self-pay

## 2023-06-20 ENCOUNTER — Encounter (HOSPITAL_COMMUNITY)
Admission: RE | Admit: 2023-06-20 | Discharge: 2023-06-20 | Disposition: A | Discharge status: Home / Self Care | Payer: Medicare Other | Source: Ambulatory Visit | Attending: Urology | Admitting: Urology

## 2023-06-20 ENCOUNTER — Other Ambulatory Visit: Payer: Self-pay

## 2023-06-20 NOTE — Pre-Procedure Instructions (Signed)
 Attempted pre-op phonecall. Left VM for her to call us back.

## 2023-06-24 ENCOUNTER — Ambulatory Visit (HOSPITAL_COMMUNITY)
Admission: RE | Admit: 2023-06-24 | Discharge: 2023-06-24 | Disposition: A | Discharge status: Home / Self Care | Payer: Medicare Other | Source: Ambulatory Visit | Attending: Urology | Admitting: Urology

## 2023-06-24 ENCOUNTER — Encounter (HOSPITAL_COMMUNITY)
Admission: RE | Disposition: A | Discharge status: Home / Self Care | Payer: Self-pay | Source: Ambulatory Visit | Attending: Urology

## 2023-06-24 ENCOUNTER — Encounter (HOSPITAL_COMMUNITY): Payer: Self-pay | Admitting: Urology

## 2023-06-24 ENCOUNTER — Ambulatory Visit (HOSPITAL_COMMUNITY): Payer: Self-pay | Admitting: Certified Registered"

## 2023-06-24 ENCOUNTER — Ambulatory Visit (HOSPITAL_BASED_OUTPATIENT_CLINIC_OR_DEPARTMENT_OTHER): Payer: Self-pay | Admitting: Certified Registered"

## 2023-06-24 ENCOUNTER — Ambulatory Visit (HOSPITAL_COMMUNITY)

## 2023-06-24 ENCOUNTER — Other Ambulatory Visit: Payer: Self-pay

## 2023-06-24 DIAGNOSIS — E119 Type 2 diabetes mellitus without complications: Secondary | ICD-10-CM | POA: Diagnosis not present

## 2023-06-24 DIAGNOSIS — N200 Calculus of kidney: Secondary | ICD-10-CM

## 2023-06-24 DIAGNOSIS — E785 Hyperlipidemia, unspecified: Secondary | ICD-10-CM | POA: Insufficient documentation

## 2023-06-24 DIAGNOSIS — G4730 Sleep apnea, unspecified: Secondary | ICD-10-CM | POA: Insufficient documentation

## 2023-06-24 DIAGNOSIS — Z7984 Long term (current) use of oral hypoglycemic drugs: Secondary | ICD-10-CM | POA: Diagnosis not present

## 2023-06-24 DIAGNOSIS — F419 Anxiety disorder, unspecified: Secondary | ICD-10-CM | POA: Insufficient documentation

## 2023-06-24 DIAGNOSIS — Z87891 Personal history of nicotine dependence: Secondary | ICD-10-CM | POA: Diagnosis not present

## 2023-06-24 DIAGNOSIS — I700 Atherosclerosis of aorta: Secondary | ICD-10-CM | POA: Insufficient documentation

## 2023-06-24 DIAGNOSIS — I10 Essential (primary) hypertension: Secondary | ICD-10-CM | POA: Insufficient documentation

## 2023-06-24 HISTORY — PX: NEPHROLITHOTOMY: SHX5134

## 2023-06-24 LAB — GLUCOSE, CAPILLARY
Glucose-Capillary: 110 mg/dL — ABNORMAL HIGH (ref 70–99)
Glucose-Capillary: 104 mg/dL — ABNORMAL HIGH (ref 70–99)

## 2023-06-24 SURGERY — NEPHROLITHOTOMY PERCUTANEOUS
Anesthesia: General | Site: Renal | Laterality: Left

## 2023-06-24 MED ORDER — LACTATED RINGERS IV SOLN: INTRAVENOUS | Status: DC

## 2023-06-24 MED ORDER — SUGAMMADEX SODIUM 200 MG/2ML IV SOLN
INTRAVENOUS | Status: AC
  Filled 2023-06-24: qty 2

## 2023-06-24 MED ORDER — PHENYLEPHRINE HCL-NACL 20-0.9 MG/250ML-% IV SOLN
INTRAVENOUS | Status: AC
Start: 2023-06-24 — End: ?
  Filled 2023-06-24: qty 250

## 2023-06-24 MED ORDER — PROPOFOL 10 MG/ML IV BOLUS
INTRAVENOUS | Status: AC
  Filled 2023-06-24: qty 20

## 2023-06-24 MED ORDER — SODIUM CHLORIDE 0.9 % IR SOLN
Status: DC | PRN
  Administered 2023-06-24: 3000 mL

## 2023-06-24 MED ORDER — DIATRIZOATE MEGLUMINE 30 % UR SOLN
URETHRAL | Status: DC | PRN
  Administered 2023-06-24: 11 mL via URETHRAL

## 2023-06-24 MED ORDER — SUGAMMADEX SODIUM 200 MG/2ML IV SOLN
INTRAVENOUS | Status: DC | PRN
Start: 2023-06-24 — End: 2023-06-24
  Administered 2023-06-24: 200 mg via INTRAVENOUS

## 2023-06-24 MED ORDER — DEXAMETHASONE SODIUM PHOSPHATE 10 MG/ML IJ SOLN
INTRAMUSCULAR | Status: DC | PRN
  Administered 2023-06-24: 5 mg via INTRAVENOUS

## 2023-06-24 MED ORDER — SUCCINYLCHOLINE CHLORIDE 200 MG/10ML IV SOSY
PREFILLED_SYRINGE | INTRAVENOUS | Status: AC
Start: 2023-06-24 — End: ?
  Filled 2023-06-24: qty 10

## 2023-06-24 MED ORDER — OXYCODONE HCL 5 MG PO TABS: 5.0000 mg | ORAL_TABLET | Freq: Once | ORAL | Status: DC | PRN

## 2023-06-24 MED ORDER — ROCURONIUM BROMIDE 10 MG/ML (PF) SYRINGE
PREFILLED_SYRINGE | INTRAVENOUS | Status: DC | PRN
  Administered 2023-06-24: 50 mg via INTRAVENOUS

## 2023-06-24 MED ORDER — CEFAZOLIN SODIUM-DEXTROSE 2-4 GM/100ML-% IV SOLN: 2.0000 g | INTRAVENOUS | Status: DC

## 2023-06-24 MED ORDER — ONDANSETRON HCL 4 MG/2ML IJ SOLN
INTRAMUSCULAR | Status: DC | PRN
  Administered 2023-06-24: 4 mg via INTRAVENOUS

## 2023-06-24 MED ORDER — STERILE WATER FOR IRRIGATION IR SOLN
Status: DC | PRN
  Administered 2023-06-24: 500 mL

## 2023-06-24 MED ORDER — CEFAZOLIN SODIUM-DEXTROSE 2-4 GM/100ML-% IV SOLN
INTRAVENOUS | Status: AC
  Filled 2023-06-24: qty 100

## 2023-06-24 MED ORDER — GLYCOPYRROLATE PF 0.2 MG/ML IJ SOSY
PREFILLED_SYRINGE | INTRAMUSCULAR | Status: AC
  Filled 2023-06-24: qty 1

## 2023-06-24 MED ORDER — ONDANSETRON HCL 4 MG/2ML IJ SOLN: 4.0000 mg | Freq: Once | INTRAMUSCULAR | Status: DC | PRN

## 2023-06-24 MED ORDER — ONDANSETRON HCL 4 MG/2ML IJ SOLN
INTRAMUSCULAR | Status: AC
  Filled 2023-06-24: qty 2

## 2023-06-24 MED ORDER — LIDOCAINE HCL (PF) 2 % IJ SOLN
INTRAMUSCULAR | Status: DC | PRN
  Administered 2023-06-24: 100 mg via INTRADERMAL

## 2023-06-24 MED ORDER — HYDROCODONE-ACETAMINOPHEN 5-325 MG PO TABS: 1.0000 | ORAL_TABLET | Freq: Four times a day (QID) | ORAL | 0 refills | Status: AC | PRN

## 2023-06-24 MED ORDER — GLYCOPYRROLATE PF 0.2 MG/ML IJ SOSY
PREFILLED_SYRINGE | INTRAMUSCULAR | Status: DC | PRN
Start: 2023-06-24 — End: 2023-06-24
  Administered 2023-06-24 (×2): .1 mg via INTRAVENOUS

## 2023-06-24 MED ORDER — FENTANYL CITRATE (PF) 100 MCG/2ML IJ SOLN
INTRAMUSCULAR | Status: AC
  Filled 2023-06-24: qty 2

## 2023-06-24 MED ORDER — OXYCODONE HCL 5 MG/5ML PO SOLN: 5.0000 mg | Freq: Once | ORAL | Status: DC | PRN

## 2023-06-24 MED ORDER — CHLORHEXIDINE GLUCONATE 0.12 % MT SOLN
15.0000 mL | Freq: Once | OROMUCOSAL | Status: AC
  Administered 2023-06-24: 15 mL via OROMUCOSAL

## 2023-06-24 MED ORDER — PHENYLEPHRINE 80 MCG/ML (10ML) SYRINGE FOR IV PUSH (FOR BLOOD PRESSURE SUPPORT)
PREFILLED_SYRINGE | INTRAVENOUS | Status: AC
  Filled 2023-06-24: qty 10

## 2023-06-24 MED ORDER — DIATRIZOATE MEGLUMINE 30 % UR SOLN
URETHRAL | Status: AC
  Filled 2023-06-24: qty 100

## 2023-06-24 MED ORDER — MIDAZOLAM HCL 2 MG/2ML IJ SOLN
INTRAMUSCULAR | Status: AC
Start: 2023-06-24 — End: ?
  Filled 2023-06-24: qty 2

## 2023-06-24 MED ORDER — PROPOFOL 10 MG/ML IV BOLUS
INTRAVENOUS | Status: DC | PRN
  Administered 2023-06-24: 50 mg via INTRAVENOUS
  Administered 2023-06-24: 150 mg via INTRAVENOUS
  Administered 2023-06-24: 50 mg via INTRAVENOUS

## 2023-06-24 MED ORDER — PHENYLEPHRINE 80 MCG/ML (10ML) SYRINGE FOR IV PUSH (FOR BLOOD PRESSURE SUPPORT)
PREFILLED_SYRINGE | INTRAVENOUS | Status: DC | PRN
  Administered 2023-06-24: 160 ug via INTRAVENOUS

## 2023-06-24 MED ORDER — ORAL CARE MOUTH RINSE: 15.0000 mL | Freq: Once | OROMUCOSAL | Status: AC

## 2023-06-24 MED ORDER — FENTANYL CITRATE (PF) 100 MCG/2ML IJ SOLN
INTRAMUSCULAR | Status: DC | PRN
  Administered 2023-06-24 (×2): 50 ug via INTRAVENOUS

## 2023-06-24 MED ORDER — FENTANYL CITRATE PF 50 MCG/ML IJ SOSY: 25.0000 ug | PREFILLED_SYRINGE | INTRAMUSCULAR | Status: DC | PRN

## 2023-06-24 MED ORDER — MIDAZOLAM HCL 2 MG/2ML IJ SOLN
INTRAMUSCULAR | Status: DC | PRN
Start: 2023-06-24 — End: 2023-06-24
  Administered 2023-06-24: 2 mg via INTRAVENOUS

## 2023-06-24 MED ORDER — CEFAZOLIN SODIUM-DEXTROSE 2-3 GM-%(50ML) IV SOLR
INTRAVENOUS | Status: DC | PRN
  Administered 2023-06-24: 2 g via INTRAVENOUS

## 2023-06-24 MED ORDER — ROCURONIUM BROMIDE 10 MG/ML (PF) SYRINGE
PREFILLED_SYRINGE | INTRAVENOUS | Status: AC
  Filled 2023-06-24: qty 10

## 2023-06-24 MED ORDER — LIDOCAINE HCL (PF) 2 % IJ SOLN
INTRAMUSCULAR | Status: AC
  Filled 2023-06-24: qty 5

## 2023-06-24 SURGICAL SUPPLY — 28 items
BASKET ZERO TIP NITINOL 2.4FR (BASKET) ×1 IMPLANT
BENZOIN TINCTURE PRP APPL 2/3 (GAUZE/BANDAGES/DRESSINGS) ×4 IMPLANT
BLADE SURG 15 STRL LF DISP TIS (BLADE) ×2 IMPLANT
CATH URETERAL DUAL LUMEN 10F (MISCELLANEOUS) ×1 IMPLANT
CHLORAPREP W/TINT 26 (MISCELLANEOUS) ×1 IMPLANT
COVER LIGHT HANDLE STERIS (MISCELLANEOUS) ×4 IMPLANT
DRAPE C-ARM FOLDED MOBILE STRL (DRAPES) ×2 IMPLANT
DRAPE HALF SHEET 40X57 (DRAPES) ×2 IMPLANT
DRAPE LINGEMAN PERC (DRAPES) ×2 IMPLANT
GAUZE SPONGE 4X4 12PLY STRL (GAUZE/BANDAGES/DRESSINGS) ×2 IMPLANT
GLOVE BIO SURGEON STRL SZ8 (GLOVE) ×2 IMPLANT
GLOVE BIOGEL PI IND STRL 7.0 (GLOVE) ×4 IMPLANT
GLOVE BIOGEL PI IND STRL 8 (GLOVE) ×1 IMPLANT
GOWN STRL REUS W/TWL LRG LVL3 (GOWN DISPOSABLE) ×2 IMPLANT
GOWN STRL REUS W/TWL XL LVL3 (GOWN DISPOSABLE) ×2 IMPLANT
KIT TURNOVER KIT A (KITS) ×2 IMPLANT
MANIFOLD NEPTUNE II (INSTRUMENTS) ×2 IMPLANT
PACK CYSTO (CUSTOM PROCEDURE TRAY) ×2 IMPLANT
PAD ARMBOARD 7.5X6 YLW CONV (MISCELLANEOUS) ×2 IMPLANT
POSITIONER HEAD PRONE TRACH (MISCELLANEOUS) ×2 IMPLANT
SET BASIN LINEN APH (SET/KITS/TRAYS/PACK) ×2 IMPLANT
SET CYSTO W/LG BORE CLAMP LF (SET/KITS/TRAYS/PACK) ×1 IMPLANT
SOL .9 NS 3000ML IRR UROMATIC (IV SOLUTION) ×3 IMPLANT
SUT VICRYL AB 3-0 FS1 BRD 27IN (SUTURE) ×2 IMPLANT
SYR 50ML LL SCALE MARK (SYRINGE) ×2 IMPLANT
TRAY FOLEY W/BAG SLVR 16FR ST (SET/KITS/TRAYS/PACK) ×2 IMPLANT
TUBE CONNECTING 12X1/4 (SUCTIONS) ×3 IMPLANT
WATER STERILE IRR 3000ML UROMA (IV SOLUTION) ×1 IMPLANT

## 2023-06-24 NOTE — Interval H&P Note (Signed)
 History and Physical Interval Note:  06/24/2023 9:31 AM  Adrienne Davidson  has presented today for surgery, with the diagnosis of Left Nephrolithiasis.  The various methods of treatment have been discussed with the patient and family. After consideration of risks, benefits and other options for treatment, the patient has consented to  Procedure(s): NEPHROLITHOTOMY PERCUTANEOUS- second look (Left) HOLMIUM LASER APPLICATION (Left) as a surgical intervention.  The patient's history has been reviewed, patient examined, no change in status, stable for surgery.  I have reviewed the patient's chart and labs.  Questions were answered to the patient's satisfaction.     Wilkie Aye

## 2023-06-24 NOTE — Anesthesia Procedure Notes (Signed)
 Procedure Name: Intubation Date/Time: 06/24/2023 10:27 AM  Performed by: Julian Reil, CRNAPre-anesthesia Checklist: Patient identified, Emergency Drugs available, Suction available and Patient being monitored Patient Re-evaluated:Patient Re-evaluated prior to induction Oxygen Delivery Method: Circle system utilized Preoxygenation: Pre-oxygenation with 100% oxygen Induction Type: IV induction Ventilation: Mask ventilation without difficulty Laryngoscope Size: Glidescope and 3 Grade View: Grade I Tube type: Oral Tube size: 7.0 mm Number of attempts: 2 Airway Equipment and Method: Rigid stylet and Video-laryngoscopy Placement Confirmation: ETT inserted through vocal cords under direct vision, positive ETCO2 and breath sounds checked- equal and bilateral Secured at: 23 cm Tube secured with: Tape Dental Injury: Teeth and Oropharynx as per pre-operative assessment  Comments: DL Miller 3, anterior anatomy, large thick tongue, grade 4 view.  2nd DL Glidescope as noted above.

## 2023-06-24 NOTE — Anesthesia Preprocedure Evaluation (Signed)
 Anesthesia Evaluation  Patient identified by MRN, date of birth, ID band Patient awake    Reviewed: Allergy & Precautions, H&P , NPO status , Patient's Chart, lab work & pertinent test results, reviewed documented beta blocker date and time   Airway Mallampati: II  TM Distance: >3 FB Neck ROM: full    Dental no notable dental hx.    Pulmonary neg pulmonary ROS, former smoker   Pulmonary exam normal breath sounds clear to auscultation       Cardiovascular Exercise Tolerance: Good hypertension, negative cardio ROS  Rhythm:regular Rate:Normal     Neuro/Psych   Anxiety     negative neurological ROS  negative psych ROS   GI/Hepatic negative GI ROS, Neg liver ROS,,,  Endo/Other  negative endocrine ROSdiabetes    Renal/GU negative Renal ROS  negative genitourinary   Musculoskeletal   Abdominal   Peds  Hematology negative hematology ROS (+)   Anesthesia Other Findings   Reproductive/Obstetrics negative OB ROS                             Anesthesia Physical Anesthesia Plan  ASA: 2  Anesthesia Plan: General and General ETT   Post-op Pain Management:    Induction:   PONV Risk Score and Plan: Ondansetron  Airway Management Planned:   Additional Equipment:   Intra-op Plan:   Post-operative Plan:   Informed Consent: I have reviewed the patients History and Physical, chart, labs and discussed the procedure including the risks, benefits and alternatives for the proposed anesthesia with the patient or authorized representative who has indicated his/her understanding and acceptance.     Dental Advisory Given  Plan Discussed with: CRNA  Anesthesia Plan Comments:        Anesthesia Quick Evaluation

## 2023-06-24 NOTE — Discharge Instructions (Addendum)
 May remove the dressing on Wednesday.  Wash with soap and water and reapply the gauze and tape.

## 2023-06-24 NOTE — Op Note (Signed)
 Preoperative diagnosis: Left renal stone  Postoperative diagnosis: Same  Procedure 1.  Left percutaneous nephrostolithotomy for stone less than 2 cm 2.  Left nephrostogram  Attending: Dr. Ronne Binning  Anesthesia: General  Estimated blood loss: Minimal  Antibiotics: ancef  Drains: 1.  6 x 26 left double-J ureteral stent  Specimens: Stone for analysis  Findings: multiple left UPJ calculi. Mid pole calculi seen on prior CT were unable to be visualized  Indications: Patient is a 68 year old female with a history of large left renal stones.  After discussing treatment options and decided she was left percutaneous nephrostolithotomy.  Patient already has a nephrostomy tube.  Procedure in detail: Prior to procedure consent was obtained.  Patient was brought to the operating room debridement was done to ensure correct patient, correct procedure, and correct site.  General anesthesia was administered.  A 16 French Foley catheter was in place.  The patient was then placed in the prone position.  His nephrostomy tube and left flank was then prepped and draped in usual sterile fashion.  A nephrostogram was obtained and findings noted above. The nephrostomy tube was then removed. Through the nephrostomy tract we used the flexible nephroscope to perform nephroscopy.  Using an in Zero tip Basket we removed stone fragments and sent for composition analysis. Once we removed the calculi we then removed the scope. The nephrostomy site was closed with an interrupted 3-0 Vicryl. A dressing was placed over the nephrostomy tube site and this then concluded the procedure was well-tolerated by the patient.  Complications: None  Condition: Stable, extubated, transferred to PACU  Plan: Patient is to be discharged home and followup in 1 week for stent removal

## 2023-06-24 NOTE — Transfer of Care (Signed)
 Immediate Anesthesia Transfer of Care Note  Patient: Adrienne Davidson  Procedure(s) Performed: NEPHROLITHOTOMY PERCUTANEOUS- second look (Left: Renal)  Patient Location: PACU  Anesthesia Type:General  Level of Consciousness: awake  Airway & Oxygen Therapy: Patient Spontanous Breathing and Patient connected to face mask oxygen  Post-op Assessment: Report given to RN and Post -op Vital signs reviewed and stable  Post vital signs: Reviewed and stable  Last Vitals:  Vitals Value Taken Time  BP    Temp    Pulse    Resp    SpO2      Last Pain:  Vitals:   06/24/23 0835  TempSrc: Oral  PainSc: 0-No pain      Patients Stated Pain Goal: 3 (06/24/23 0835)  Complications: No notable events documented.

## 2023-06-25 ENCOUNTER — Encounter (HOSPITAL_COMMUNITY): Payer: Self-pay | Admitting: Urology

## 2023-06-25 NOTE — Anesthesia Postprocedure Evaluation (Signed)
 Anesthesia Post Note  Patient: Adrienne Davidson  Procedure(s) Performed: NEPHROLITHOTOMY PERCUTANEOUS- second look (Left: Renal)  Patient location during evaluation: Phase II Anesthesia Type: General Level of consciousness: awake Pain management: pain level controlled Vital Signs Assessment: post-procedure vital signs reviewed and stable Respiratory status: spontaneous breathing and respiratory function stable Cardiovascular status: blood pressure returned to baseline and stable Postop Assessment: no headache and no apparent nausea or vomiting Anesthetic complications: no Comments: Late entry   No notable events documented.   Last Vitals:  Vitals:   06/24/23 1200 06/24/23 1211  BP:  (!) 142/80  Pulse:  78  Resp:  16  Temp: 36.6 C (!) 36.3 C  SpO2:  99%    Last Pain:  Vitals:   06/24/23 1211  TempSrc: Oral  PainSc: 0-No pain                 Windell Norfolk

## 2023-06-27 ENCOUNTER — Ambulatory Visit: Payer: Medicare Other | Admitting: Urology

## 2023-06-27 ENCOUNTER — Encounter: Payer: Self-pay | Admitting: Urology

## 2023-06-27 VITALS — BP 130/69 | HR 81 | Temp 98.0°F

## 2023-06-27 DIAGNOSIS — N200 Calculus of kidney: Secondary | ICD-10-CM

## 2023-06-27 DIAGNOSIS — Z09 Encounter for follow-up examination after completed treatment for conditions other than malignant neoplasm: Secondary | ICD-10-CM

## 2023-06-27 DIAGNOSIS — Z87442 Personal history of urinary calculi: Secondary | ICD-10-CM

## 2023-06-27 NOTE — Progress Notes (Signed)
 Name: Adrienne Davidson DOB: 10-10-1955 MRN: [id number]  Diagnoses: Post-operative state  HPI: She presents postoperatively.  She is accompanied by her daughter Florentina Addison.  She underwent the following procedures by Dr. Ronne Binning on 06/24/2023:  Preoperative diagnosis: Left renal stone   Postoperative diagnosis: Same   Procedure: 1.  Left percutaneous nephrostolithotomy for stone <2 cm 2.  Left nephrostogram   Drains: 1.  6 x 26 left double-J ureteral stent   Specimens: Stone for analysis   Findings: multiple left UPJ calculi. Mid pole calculi seen on prior CT were unable to be visualized  Postop course: Today She reports that within 24 hours after discharge she had chills, low grade fever, diarrhea, weakness, and significant mucous production and expectorated a lot of "phlegm".   In the last 24-48 hours she reports occasional waves of nausea, some ongoing diarrhea, and persistent weakness. Denies chills / sweats / fever today. States her cough has gotten "much better" but reports sinus congestion.   She denies redness, warmth, tenderness, swelling, or drainage at the left PCN tube site.   She denies dysuria, gross hematuria, hesitancy, straining to void, or sensations of incomplete emptying.  Medications: Current Outpatient Medications  Medication Sig Dispense Refill   acetaminophen (TYLENOL) 500 MG tablet Take 500-1,000 mg by mouth every 6 (six) hours as needed (pain.).     amLODipine (NORVASC) 5 MG tablet Take 1 tablet (5 mg total) by mouth daily. (Patient taking differently: Take 5 mg by mouth every evening.) 100 tablet 3   Cholecalciferol (D-3-5) 125 MCG (5000 UT) capsule Take 5,000 Units by mouth in the morning.     Coenzyme Q10 200 MG capsule Take 200 mg by mouth at bedtime.     CRANBERRY PO Take 504 mg by mouth at bedtime.     dapagliflozin propanediol (FARXIGA) 5 MG TABS tablet Take 1 tablet (5 mg total) by mouth daily. 90 tablet 3   Dulaglutide (TRULICITY) 3 MG/0.5ML SOPN  Inject 3 mg as directed once a week. 6 mL 3   EPINEPHrine 0.3 mg/0.3 mL IJ SOAJ injection Inject 0.3 mg into the muscle as needed for anaphylaxis. Then call 911 2 each 0   glucose blood test strip Test blood sugar once daily (one touch verio if covered by ins/ otherwise whatever ins preference is) E11.65 100 each 12   HYDROcodone-acetaminophen (NORCO/VICODIN) 5-325 MG tablet Take 1 tablet by mouth every 6 (six) hours as needed for up to 5 days for moderate pain (pain score 4-6) or severe pain (pain score 7-10). 15 tablet 0   Krill Oil 500 MG CAPS Take 500 mg by mouth in the morning.     Lancets (ONETOUCH ULTRASOFT) lancets Use once daily as instructed E11.65 (one touch verio) 100 each 12   LORazepam (ATIVAN) 0.5 MG tablet Take 0.5-1 tablets (0.25-0.5 mg total) by mouth 2 (two) times daily as needed for anxiety. 10 tablet 0   metFORMIN (GLUCOPHAGE) 1000 MG tablet Take 1 tablet (1,000 mg total) by mouth 2 (two) times daily with a meal. 200 tablet 3   Multiple Vitamin (MULTIVITAMIN WITH MINERALS) TABS tablet Take 1 tablet by mouth in the morning.     OVER THE COUNTER MEDICATION Take 1 tablet by mouth daily. Nervive supplement     rosuvastatin (CRESTOR) 10 MG tablet Take 1 tablet (10 mg total) by mouth daily. (Patient taking differently: Take 10 mg by mouth every evening.) 100 tablet 3   sertraline (ZOLOFT) 100 MG tablet Take 1 tablet (100 mg total)  by mouth daily. 100 tablet 3   No current facility-administered medications for this visit.    Allergies: Allergies  Allergen Reactions   Enalapril Anaphylaxis    Angioedema Aug 2018    Past Medical History:  Diagnosis Date   Allergic rhinitis    Allergy    Anxiety    Arthritis    Diabetes mellitus (HCC)    Hyperlipidemia    Hypertension    Psoriasis    Bilateral feet   Sleep apnea    Sleep apnea    Vitamin D deficiency    Past Surgical History:  Procedure Laterality Date   APPENDECTOMY     CESAREAN SECTION  1980   CESAREAN SECTION   1984   IR URETERAL STENT RIGHT NEW ACCESS W/O SEP NEPHROSTOMY CATH  05/22/2023   NEPHROLITHOTOMY Left 05/23/2023   Procedure: NEPHROLITHOTOMY PERCUTANEOUS;  Surgeon: Malen Gauze, MD;  Location: [facility];  Service: Urology;  Laterality: Left;   NEPHROLITHOTOMY Left 06/24/2023   Procedure: NEPHROLITHOTOMY PERCUTANEOUS- second look;  Surgeon: Malen Gauze, MD;  Location: [facility];  Service: Urology;  Laterality: Left;   TONSILLECTOMY     Family History  Problem Relation Age of Onset   Non-Hodgkin's lymphoma Mother    Arthritis Mother    Cancer Mother    Heart disease Father    Arthritis Father    COPD Father    Hypertension Father    Diabetes Daughter    Rheum arthritis Daughter    Polycystic ovary syndrome Daughter    Breast cancer Neg Hx    Social History   Socioeconomic History   Marital status: Married    Spouse name: Aurther Loft   Number of children: 2   Years of education: 12   Highest education level: Associate degree: occupational, Scientist, product/process development, or vocational program  Occupational History   Occupation: paralegal    Comment: retired  Tobacco Use   Smoking status: Former    Current packs/day: 0.00    Average packs/day: 0.3 packs/day for 25.0 years (6.3 ttl pk-yrs)    Types: Cigarettes    Start date: 06/26/1993    Quit date: 06/27/2018    Years since quitting: 5.0   Smokeless tobacco: Never  Vaping Use   Vaping status: Never Used  Substance and Sexual Activity   Alcohol use: Not Currently    Comment: wine once per year   Drug use: Not Currently   Sexual activity: Not Currently    Birth control/protection: None  Other Topics Concern   Not on file  Social History Narrative   Not on file   Social Drivers of Health   Financial Resource Strain: Low Risk  (09/17/2022)   Overall Financial Resource Strain (CARDIA)    Difficulty of Paying Living Expenses: Not very hard  Food Insecurity: No Food Insecurity (05/23/2023)   Hunger Vital Sign    Worried About Running Out of  Food in the Last Year: Never true    Ran Out of Food in the Last Year: Never true  Transportation Needs: No Transportation Needs (05/23/2023)   PRAPARE - Administrator, Civil Service (Medical): No    Lack of Transportation (Non-Medical): No  Physical Activity: Insufficiently Active (09/17/2022)   Exercise Vital Sign    Days of Exercise per Week: 3 days    Minutes of Exercise per Session: 40 min  Stress: No Stress Concern Present (09/17/2022)   [employer] of Occupational Health - Occupational Stress Questionnaire    Feeling of  Stress : Only a little  Social Connections: Moderately Isolated (05/23/2023)   Social Connection and Isolation Panel [NHANES]    Frequency of Communication with Friends and Family: More than three times a week    Frequency of Social Gatherings with Friends and Family: Once a week    Attends Religious Services: Never    Database administrator or Organizations: No    Attends Banker Meetings: Never    Marital Status: Married  Catering manager Violence: Not At Risk (05/23/2023)   Humiliation, Afraid, Rape, and Kick questionnaire    Fear of Current or Ex-Partner: No    Emotionally Abused: No    Physically Abused: No    Sexually Abused: No    SUBJECTIVE  Review of Systems Constitutional: Patient denies any unintentional weight loss or change in strength lntegumentary: Patient denies any rashes or pruritus Cardiovascular: Patient denies chest pain or syncope Respiratory: Patient denies shortness of breath Gastrointestinal: As per HPI Musculoskeletal: Patient denies muscle cramps or weakness Neurologic: Patient denies convulsions or seizures Allergic/Immunologic: Patient denies recent allergic reaction(s) Hematologic/Lymphatic: Patient denies bleeding tendencies Endocrine: Patient denies heat/cold intolerance  GU: As per HPI.  OBJECTIVE Vitals:   06/27/23 1412  BP: 130/69  Pulse: 81  Temp: 98 F (36.7 C)   There is no  height or weight on file to calculate BMI.  Physical Examination Constitutional: No obvious distress; patient is non-toxic appearing  Cardiovascular: No visible lower extremity edema.  Respiratory: The patient does not have audible wheezing/stridor; respirations do not appear labored  Gastrointestinal: Abdomen non-distended Musculoskeletal: Normal ROM of UEs  Skin: No obvious rashes/open sores  Neurologic: CN 2-12 grossly intact Psychiatric: Answered questions appropriately with normal affect  Hematologic/Lymphatic/Immunologic: No obvious bruises or sites of spontaneous bleeding  GU: Left PCN tube site well healed with no surrounding erythema, edema, crepitus, fluctuance, drainage / discharge, warmth, significant tenderness to palpation.   UA: Patient did not void in office today (states she struggles to void on demand)  ASSESSMENT Kidney stones  Postop check  We reviewed the operative procedures and findings. Surgical site healing well. Pain is well controlled. Offered I&O cath to collect urine specimen today or she can drop off urine tomorrow / another day if desired; she elected to drop off urine cup if she feels it is needed. She was instructed to follow up as previously scheduled with Dr. Ronne Binning on 07/10/2023 for stent removal and to follow up with her PCP if her non-GU symptoms persist. Pt verbalized understanding and agreement. All questions were answered.  PLAN Advised the following: Return in 13 days (on 07/10/2023) for as previously scheduled with Dr. Ronne Binning.  No orders of the defined types were placed in this encounter.  It has been explained that the patient is to follow regularly with their PCP in addition to all other providers involved in their care and to follow instructions provided by these respective offices. Patient advised to contact urology clinic if any urologic-pertaining questions, concerns, new symptoms or problems arise in the interim period.  There are no  Patient Instructions on file for this visit.  Electronically signed by:  Donnita Falls, MSN, FNP-C, CUNP 06/27/2023 2:48 PM

## 2023-07-03 LAB — CALCULI, WITH PHOTOGRAPH (CLINICAL LAB)
Calcium Oxalate Dihydrate: 10 %
Calcium Oxalate Monohydrate: 90 %
Weight Calculi: 44 mg

## 2023-07-10 ENCOUNTER — Ambulatory Visit: Payer: Medicare Other | Admitting: Urology

## 2023-07-10 VITALS — BP 129/68 | HR 75

## 2023-07-10 DIAGNOSIS — Z466 Encounter for fitting and adjustment of urinary device: Secondary | ICD-10-CM | POA: Diagnosis not present

## 2023-07-10 DIAGNOSIS — N200 Calculus of kidney: Secondary | ICD-10-CM | POA: Diagnosis not present

## 2023-07-10 LAB — URINALYSIS, ROUTINE W REFLEX MICROSCOPIC
Bilirubin, UA: NEGATIVE
Ketones, UA: NEGATIVE
Nitrite, UA: POSITIVE — AB
Specific Gravity, UA: 1.02 (ref 1.005–1.030)
Urobilinogen, Ur: 0.2 mg/dL (ref 0.2–1.0)
pH, UA: 5.5 (ref 5.0–7.5)

## 2023-07-10 LAB — MICROSCOPIC EXAMINATION
RBC, Urine: >30 /HPF — AB (ref 0–2)
WBC, UA: >30 /HPF — AB (ref 0–5)

## 2023-07-10 MED ORDER — CIPROFLOXACIN HCL 500 MG PO TABS
500.0000 mg | ORAL_TABLET | Freq: Once | ORAL | Status: AC
  Administered 2023-07-10: 500 mg via ORAL

## 2023-07-10 NOTE — Progress Notes (Signed)
   07/10/23  CC: followup nephrolithiasis   HPI: Adrienne Davidson is a 67yo here for stent removal Blood pressure 129/68, pulse 75. NED. A&Ox3.   No respiratory distress   Abd soft, NT, ND Normal external genitalia with patent urethral meatus  Cystoscopy Procedure Note  Patient identification was confirmed, informed consent was obtained, and patient was prepped using Betadine solution.  Lidocaine jelly was administered per urethral meatus.    Procedure: - Flexible cystoscope introduced, without any difficulty.   - Thorough search of the bladder revealed:    normal urethral meatus    normal urothelium    no stones    no ulcers     no tumors    no urethral polyps    no trabeculation  - Ureteral orifices were normal in position and appearance. -Using a grasper the left ureteral stent was removed intact  Post-Procedure: - Patient tolerated the procedure well   Assessment/ Plan: Followup 6 weeks with a renal US   No follow-ups on file.  Wilkie Aye, MD

## 2023-07-16 ENCOUNTER — Encounter: Payer: Self-pay | Admitting: Urology

## 2023-07-16 NOTE — Patient Instructions (Signed)

## 2023-07-17 ENCOUNTER — Ambulatory Visit: Payer: Medicare Other | Admitting: Family Medicine

## 2023-07-17 ENCOUNTER — Ambulatory Visit
Admission: RE | Admit: 2023-07-17 | Discharge: 2023-07-17 | Disposition: A | Discharge status: Home / Self Care | Source: Ambulatory Visit | Attending: Family Medicine | Admitting: Family Medicine

## 2023-07-17 ENCOUNTER — Encounter: Payer: Self-pay | Admitting: Family Medicine

## 2023-07-17 ENCOUNTER — Other Ambulatory Visit: Payer: Self-pay | Admitting: Family Medicine

## 2023-07-17 VITALS — BP 115/67 | HR 64 | Temp 98.7°F | Ht 62.0 in | Wt 159.2 lb

## 2023-07-17 DIAGNOSIS — Z1231 Encounter for screening mammogram for malignant neoplasm of breast: Secondary | ICD-10-CM

## 2023-07-17 DIAGNOSIS — Z87442 Personal history of urinary calculi: Secondary | ICD-10-CM | POA: Diagnosis not present

## 2023-07-17 DIAGNOSIS — I708 Atherosclerosis of other arteries: Secondary | ICD-10-CM | POA: Diagnosis not present

## 2023-07-17 DIAGNOSIS — E1129 Type 2 diabetes mellitus with other diabetic kidney complication: Secondary | ICD-10-CM

## 2023-07-17 DIAGNOSIS — Z9889 Other specified postprocedural states: Secondary | ICD-10-CM | POA: Diagnosis not present

## 2023-07-17 DIAGNOSIS — I700 Atherosclerosis of aorta: Secondary | ICD-10-CM

## 2023-07-17 DIAGNOSIS — Z23 Encounter for immunization: Secondary | ICD-10-CM | POA: Diagnosis not present

## 2023-07-17 DIAGNOSIS — Z7985 Long-term (current) use of injectable non-insulin antidiabetic drugs: Secondary | ICD-10-CM | POA: Diagnosis not present

## 2023-07-17 DIAGNOSIS — R809 Proteinuria, unspecified: Secondary | ICD-10-CM | POA: Diagnosis not present

## 2023-07-17 NOTE — Progress Notes (Signed)
 Subjective: ZO:XWRU f/u for lithotripsy PCP: Raliegh Ip, DO EAV:[id number] T Tilson is a 68 y.o. female presenting to clinic today for:  1.  Hospital follow-up/abnormal CT/diabetes Patient with known nephrolithiasis.  She was seen a second time recently to perform lithotripsy.  She reports that she seems to be recovering well from that.  She reports no hematuria or pelvic pain today.  She did voice some concern regarding one of the findings on the most recent CAT scan that she had where she thinks maybe she needs to see a cardiologist or some other type of specialist.  Upon further discussion this appears to be a comment where they denoted severe aortoiliac atherosclerosis.  She denies any difficulty with lower extremity pain, discoloration or decreased tolerance of activity.  She is compliant with her statin, blood pressure medication, Farxiga, Trulicity and metformin.   ROS: Per HPI  Allergies  Allergen Reactions   Enalapril Anaphylaxis    Angioedema Aug 2018   Past Medical History:  Diagnosis Date   Allergic rhinitis    Allergy    Anxiety    Arthritis    Diabetes mellitus (HCC)    Hyperlipidemia    Hypertension    Psoriasis    Bilateral feet   Sleep apnea    Sleep apnea    Vitamin D deficiency     Current Outpatient Medications:    acetaminophen (TYLENOL) 500 MG tablet, Take 500-1,000 mg by mouth every 6 (six) hours as needed (pain.)., Disp: , Rfl:    amLODipine (NORVASC) 5 MG tablet, Take 1 tablet (5 mg total) by mouth daily. (Patient taking differently: Take 5 mg by mouth every evening.), Disp: 100 tablet, Rfl: 3   Cholecalciferol (D-3-5) 125 MCG (5000 UT) capsule, Take 5,000 Units by mouth in the morning., Disp: , Rfl:    Coenzyme Q10 200 MG capsule, Take 200 mg by mouth at bedtime., Disp: , Rfl:    CRANBERRY PO, Take 504 mg by mouth at bedtime., Disp: , Rfl:    dapagliflozin propanediol (FARXIGA) 5 MG TABS tablet, Take 1 tablet (5 mg total) by mouth daily., Disp:  90 tablet, Rfl: 3   Dulaglutide (TRULICITY) 3 MG/0.5ML SOPN, Inject 3 mg as directed once a week., Disp: 6 mL, Rfl: 3   EPINEPHrine 0.3 mg/0.3 mL IJ SOAJ injection, Inject 0.3 mg into the muscle as needed for anaphylaxis. Then call 911, Disp: 2 each, Rfl: 0   glucose blood test strip, Test blood sugar once daily (one touch verio if covered by ins/ otherwise whatever ins preference is) E11.65, Disp: 100 each, Rfl: 12   Krill Oil 500 MG CAPS, Take 500 mg by mouth in the morning., Disp: , Rfl:    Lancets (ONETOUCH ULTRASOFT) lancets, Use once daily as instructed E11.65 (one touch verio), Disp: 100 each, Rfl: 12   LORazepam (ATIVAN) 0.5 MG tablet, Take 0.5-1 tablets (0.25-0.5 mg total) by mouth 2 (two) times daily as needed for anxiety., Disp: 10 tablet, Rfl: 0   metFORMIN (GLUCOPHAGE) 1000 MG tablet, Take 1 tablet (1,000 mg total) by mouth 2 (two) times daily with a meal., Disp: 200 tablet, Rfl: 3   Multiple Vitamin (MULTIVITAMIN WITH MINERALS) TABS tablet, Take 1 tablet by mouth in the morning., Disp: , Rfl:    OVER THE COUNTER MEDICATION, Take 1 tablet by mouth daily. Nervive supplement, Disp: , Rfl:    rosuvastatin (CRESTOR) 10 MG tablet, Take 1 tablet (10 mg total) by mouth daily. (Patient taking differently: Take 10 mg  by mouth every evening.), Disp: 100 tablet, Rfl: 3   sertraline (ZOLOFT) 100 MG tablet, Take 1 tablet (100 mg total) by mouth daily., Disp: 100 tablet, Rfl: 3 Social History   Socioeconomic History   Marital status: Married    Spouse name: Aurther Loft   Number of children: 2   Years of education: 12   Highest education level: Associate degree: occupational, Scientist, product/process development, or vocational program  Occupational History   Occupation: paralegal    Comment: retired  Tobacco Use   Smoking status: Former    Current packs/day: 0.00    Average packs/day: 0.3 packs/day for 25.0 years (6.3 ttl pk-yrs)    Types: Cigarettes    Start date: 06/26/1993    Quit date: 06/27/2018    Years since quitting:  5.0   Smokeless tobacco: Never  Vaping Use   Vaping status: Never Used  Substance and Sexual Activity   Alcohol use: Not Currently    Comment: wine once per year   Drug use: Not Currently   Sexual activity: Not Currently    Birth control/protection: None  Other Topics Concern   Not on file  Social History Narrative   Not on file   Social Drivers of Health   Financial Resource Strain: Low Risk  (09/17/2022)   Overall Financial Resource Strain (CARDIA)    Difficulty of Paying Living Expenses: Not very hard  Food Insecurity: No Food Insecurity (05/23/2023)   Hunger Vital Sign    Worried About Running Out of Food in the Last Year: Never true    Ran Out of Food in the Last Year: Never true  Transportation Needs: No Transportation Needs (05/23/2023)   PRAPARE - Administrator, Civil Service (Medical): No    Lack of Transportation (Non-Medical): No  Physical Activity: Insufficiently Active (09/17/2022)   Exercise Vital Sign    Days of Exercise per Week: 3 days    Minutes of Exercise per Session: 40 min  Stress: No Stress Concern Present (09/17/2022)   [employer] of Occupational Health - Occupational Stress Questionnaire    Feeling of Stress : Only a little  Social Connections: Moderately Isolated (05/23/2023)   Social Connection and Isolation Panel [NHANES]    Frequency of Communication with Friends and Family: More than three times a week    Frequency of Social Gatherings with Friends and Family: Once a week    Attends Religious Services: Never    Database administrator or Organizations: No    Attends Banker Meetings: Never    Marital Status: Married  Catering manager Violence: Not At Risk (05/23/2023)   Humiliation, Afraid, Rape, and Kick questionnaire    Fear of Current or Ex-Partner: No    Emotionally Abused: No    Physically Abused: No    Sexually Abused: No   Family History  Problem Relation Age of Onset   Non-Hodgkin's lymphoma Mother     Arthritis Mother    Cancer Mother    Heart disease Father    Arthritis Father    COPD Father    Hypertension Father    Diabetes Daughter    Rheum arthritis Daughter    Polycystic ovary syndrome Daughter    Breast cancer Neg Hx     Objective: Office vital signs reviewed. BP 115/67   Pulse 64   Temp 98.7 F (37.1 C)   Ht 5\' 2"  (1.575 m)   Wt 159 lb 3.2 oz (72.2 kg)   SpO2 99%   BMI  29.12 kg/m   Physical Examination:  General: Awake, alert, well nourished, No acute distress HEENT: sclera white, MMM GU: no CVA TTP Cardio: regular rate and rhythm, S1S2 heard, no murmurs appreciated Pulm: clear to auscultation bilaterally, no wheezes, rhonchi or rales; normal work of breathing on room air  Diabetic Foot Exam - Simple   Simple Foot Form Diabetic Foot exam was performed with the following findings: Yes 07/17/2023  1:06 PM  Visual Inspection No deformities, no ulcerations, no other skin breakdown bilaterally: Yes Sensation Testing Intact to touch and monofilament testing bilaterally: Yes Pulse Check Posterior Tibialis and Dorsalis pulse intact bilaterally: Yes Comments    DG C-Arm 1-60 Min-No Report Result Date: 06/24/2023 Fluoroscopy was utilized by the requesting physician.  No radiographic interpretation.   CT RENAL STONE STUDY Result Date: 05/28/2023 CLINICAL DATA:  68 year old female with staghorn calculi, left nephrostomy. EXAM: CT ABDOMEN AND PELVIS WITHOUT CONTRAST TECHNIQUE: Multidetector CT imaging of the abdomen and pelvis was performed following the standard protocol without IV contrast. RADIATION DOSE REDUCTION: This exam was performed according to the departmental dose-optimization program which includes automated exposure control, adjustment of the mA and/or kV according to patient size and/or use of iterative reconstruction technique. COMPARISON:  CT Abdomen and Pelvis 04/12/2023. FINDINGS: Lower chest: Heart size remains normal. No pericardial or pleural effusion.  Negative lung bases. Hepatobiliary: Negative noncontrast liver and gallbladder. Pancreas: Negative. Spleen: Negative. Adrenals/Urinary Tract: Normal adrenal glands. Large right mid and lower pole staghorn renal calculi appears stable since December, up to 3 cm. Right kidney and ureter appear nonobstructed. New percutaneous left nephrostomy tube, which enters the left lower pole collecting system, and superimposed right double-J ureteral stent. The proximal stent pigtail appears formed mostly in the proximal ureter just below the ureteropelvic junction (coronal image 49). New left renal collecting system gas. Underlying regressed but not resolved widespread left renal staghorn calculi. Mild new left pararenal space inflammation, and trace mildly complex pararenal space fluid (series 2, image 33). The left ureter is nondilated along the course of the stent. There is redundant distal ureteral stent in the urinary bladder with a small volume of gas. No calculus identified along the course of the stent. Stomach/Bowel: Nondilated large and small bowel. Sigmoid diverticulosis redemonstrated. Evidence of previous appendectomy. No free air, free fluid, or bowel inflammation identified. Moderate retained food and fluid in the stomach. Vascular/Lymphatic: Severe Aortoiliac calcified atherosclerosis. Vascular patency is not evaluated in the absence of IV contrast. Normal caliber abdominal aorta. No lymphadenopathy identified. Reproductive: Negative noncontrast appearance. Other: No pelvis free fluid. Musculoskeletal: Severe lumbar spine degeneration superimposed on degenerative appearing ankylosis of the lumbosacral junction. Flowing lower thoracic endplate osteophytes and ankylosis. Chronic pubic symphysis degeneration. No acute osseous abnormality identified. IMPRESSION: 1. New left nephrostomy tube which accesses the left lower pole collecting system. Superimposed double-J left ureteral stent may have migrated distally since  initial placement: The proximal stent pigtail is formed more in the proximal ureter than the UPJ. Trace left pararenal space blood or complex fluid. 2. Gas in the left renal collecting system with regressed but not resolved extensive left kidney staghorn calculi. Nondilated left ureter. Stable contralateral nonobstructing right renal staghorn calculi 3. No other acute or inflammatory process identified in the noncontrast abdomen or pelvis. Advanced aortic Atherosclerosis (ICD10-I70.0). Advanced spine degeneration. Electronically Signed   By: Odessa Fleming M.D.   On: 05/28/2023 11:40   Assessment/ Plan: 68 y.o. female   History of renal stone  History of lithotripsy  Aorto-iliac  atherosclerosis (HCC) - Plan: Ambulatory referral to Vascular Surgery  Encounter for Prevnar pneumococcal vaccination - Plan: Pneumococcal conjugate vaccine 20-valent (Prevnar 20)  Type 2 diabetes mellitus with microalbuminuria, without long-term current use of insulin (HCC)  Long-term current use of injectable noninsulin antidiabetic medication  I reviewed her recent urology notes.  We reviewed her CT scan and I was able to find the reading that concerned her.  I will place referral to vascular surgery since the patency could not be determined but she does not seem to be symptomatic.  I encouraged her to continue managing her blood sugar, cholesterol and blood pressure as directed.  Continue physical activity to reduce cardiovascular risk.   Raliegh Ip, DO [facility] [phone]

## 2023-07-17 NOTE — Patient Instructions (Signed)
 Atherosclerosis  Atherosclerosis is when plaque builds up in the arteries. This causes narrowing and hardening of the arteries. Arteries are blood vessels that carry blood from the heart to all parts of the body. This blood contains oxygen. Plaque occurs due to inflammation or from a buildup of fat, cholesterol, calcium, waste products of cells, and a clotting material in the blood (fibrin). Plaque decreases the amount of blood that can flow through the artery. Atherosclerosis can affect any artery in your body, including: Heart arteries. Damage to these arteries may lead to coronary artery disease, which can cause a heart attack. Brain arteries. Damage to these arteries may cause a stroke. Leg, arm, and pelvis arteries. Peripheral artery disease (PAD) may result from damage to these arteries. Kidney arteries. Kidney (renal) failure may result from damage to kidney arteries. Treatment may slow the disease and prevent further damage to your heart, brain, peripheral arteries, and kidneys. What are the causes? This condition develops slowly over many years. The inner layers of your arteries become damaged and allow the gradual buildup of plaque. The exact cause of atherosclerosis is not fully understood. Symptoms of atherosclerosis do not occur until an artery becomes narrow or blocked. What increases the risk? The following factors may make you more likely to develop this condition: Being middle-aged or older. Certain medical conditions, including: High blood pressure. High cholesterol. High blood fats (triglycerides). Diabetes. Sleep apnea. Obesity. Certain lab levels, including: Elevated C-reactive protein (CRP). This is a sign of increased inflammation in your body. Elevated homocysteine levels. This is an amino acid that is associated with heart and blood vessel disease. Using tobacco or nicotine products. A family history of atherosclerosis. Not exercising enough (sedentary  lifestyle). Being stressed. Drinking too much alcohol or using drugs, such as cocaine or methamphetamine. What are the signs or symptoms? Symptoms of atherosclerosis do not occur until the plaque severely narrows or blocks the artery, which decreases blood flow. Sometimes, atherosclerosis does not cause symptoms. Symptoms of this condition include: Coronary artery disease. This may cause chest pain and shortness of breath. Decreased blood supply to your brain, which may cause a stroke. Signs of a stroke may include sudden: Weakness or numbness in your face, arm, or leg, especially on one side of your body. Trouble walking or difficulty moving your arms or legs. Loss of balance or coordination. Confusion. Slurred speech. Trouble speaking, or trouble understanding speech, or both (aphasia). Vision changes in one or both eyes. This may be double vision, blurred vision, or loss of vision. Severe headache with no known cause. The headache is often described as the worst headache ever experienced. PAD, which may cause pain, numbness, or nonhealing wounds, often in your legs and hips. Renal failure. This may cause tiredness, problems with urination, swelling, and itchy skin. How is this diagnosed? This condition is diagnosed based on your medical history and a physical exam. During the exam, your health care provider will: Check your pulse in different places. Listen for a "whooshing" sound over your arteries (bruit). You may also have tests, such as: Blood tests to check your levels of cholesterol, triglycerides, blood sugar, and CRP. Ankle-brachial index to compare blood pressure in your arms to blood pressure in your ankles to see how your blood is flowing. Heart (cardiac) tests. Electrocardiogram (ECG) to check for heart damage. Stress test to see how your heart reacts to exercise. Ultrasound tests. Ultrasound of your peripheral arteries to check blood flow. Echocardiogram to get images of  your heart's  chambers and valves. X-ray tests. Chest X-ray to see if you have an enlarged heart, which is a sign of heart failure. CT scan to check for damage to your heart, brain, or arteries. Angiogram. This is a test where dye is injected and X-rays are used to see the blood flow in the arteries. How is this treated? This condition is treated with lifestyle changes as the first step. These may include: Changing your diet. Losing weight. Reducing stress. Exercising and being physically active more regularly. Quitting smoking. You may also need medicine to: Lower triglycerides and cholesterol. Control blood pressure. Prevent blood clots. Lower inflammation in your body. Control your blood sugar. Sometimes, surgery is needed to: Remove plaque from an artery (endarterectomy). Open or widen a narrowed heart artery or peripheral artery (angioplasty). Create a new path for your blood with one of these procedures: Heart (coronary) artery bypass graft surgery. Peripheral artery bypass graft surgery. Place a small mesh tube (stent) in an artery to open or widen a narrowed artery. Follow these instructions at home: Eating and drinking  Eat a heart-healthy diet. Talk with your health care provider or a dietitian if you need help. A heart-healthy diet involves: Limiting unhealthy fats and increasing healthy fats. Some examples of healthy fats are avocados and olive oil. Eating plant-based foods, such as fruits, vegetables, nuts, whole grains, and legumes (such as peas and lentils). If you drink alcohol: Limit how much you have to: 0-1 drink a day for women who are not pregnant. 0-2 drinks a day for men. Know how much alcohol is in a drink. In the [place] one drink equals one 12 oz bottle of beer (355 mL), one 5 oz glass of wine (148 mL), or one 1 oz glass of hard liquor (44 mL). Lifestyle  Maintain a healthy weight. Lose weight if your health care provider says that you need to do  that. Follow an exercise program as told by your health care provider. Do not use any products that contain nicotine or tobacco. These products include cigarettes, chewing tobacco, and vaping devices, such as e-cigarettes. If you need help quitting, ask your health care provider. Do not use drugs. General instructions Take over-the-counter and prescription medicines only as told by your health care provider. Manage other health conditions as told. Keep all follow-up visits. This is important. Contact a health care provider if you have: An irregular heartbeat. Unexplained tiredness (fatigue). Trouble urinating, or you are producing less urine or foamy urine. Swelling of your hands or feet, or itchy skin. Unexplained pain or numbness in your legs or hips. A wound that is slow to heal or is not healing. Get help right away if: You have any symptoms of a heart attack. These may be: Chest pain. This includes squeezing chest pain that may feel like indigestion (angina). Shortness of breath. Pain in your neck, jaw, arms, back, or stomach. Cold sweat. Nausea. Light-headedness. Sudden pain, numbness, or coldness in a limb. You have any symptoms of a stroke. "BE FAST" is an easy way to remember the main warning signs of a stroke: B - Balance. Signs are dizziness, sudden trouble walking, or loss of balance. E - Eyes. Signs are trouble seeing or a sudden change in vision. F - Face. Signs are sudden weakness or numbness of the face, or the face or eyelid drooping on one side. A - Arms. Signs are weakness or numbness in an arm. This happens suddenly and usually on one side of the body. S -  Speech. Signs are sudden trouble speaking, slurred speech, or trouble understanding what people say. T - Time. Time to call emergency services. Write down what time symptoms started. You have other signs of a stroke, such as: A sudden, severe headache with no known cause. Nausea or vomiting. Seizure. These  symptoms may represent a serious problem that is an emergency. Do not wait to see if the symptoms will go away. Get medical help right away. Call your local emergency services (911 in the [place] Do not drive yourself to the hospital. Summary Atherosclerosis is when plaque builds up in the arteries and causes narrowing and hardening of the arteries. Plaque occurs due to inflammation or from a buildup of fat, cholesterol, calcium, cellular waste products, and fibrin. This condition may not cause any symptoms. Symptoms of atherosclerosis do not occur until the plaque severely narrows or blocks the artery. Treatment starts with lifestyle changes and may include medicines. In some cases, surgery is needed. Get help right away if you have any symptoms of a heart attack or stroke. This information is not intended to replace advice given to you by your health care provider. Make sure you discuss any questions you have with your health care provider. Document Revised: 07/13/2020 Document Reviewed: 07/13/2020 Elsevier Patient Education  2024 [employer].

## 2023-08-19 DIAGNOSIS — Z85828 Personal history of other malignant neoplasm of skin: Secondary | ICD-10-CM | POA: Diagnosis not present

## 2023-08-19 DIAGNOSIS — L812 Freckles: Secondary | ICD-10-CM | POA: Diagnosis not present

## 2023-08-19 DIAGNOSIS — D2272 Melanocytic nevi of left lower limb, including hip: Secondary | ICD-10-CM | POA: Diagnosis not present

## 2023-08-19 DIAGNOSIS — L570 Actinic keratosis: Secondary | ICD-10-CM | POA: Diagnosis not present

## 2023-08-19 DIAGNOSIS — L821 Other seborrheic keratosis: Secondary | ICD-10-CM | POA: Diagnosis not present

## 2023-08-19 DIAGNOSIS — D1801 Hemangioma of skin and subcutaneous tissue: Secondary | ICD-10-CM | POA: Diagnosis not present

## 2023-08-28 ENCOUNTER — Other Ambulatory Visit: Payer: Self-pay

## 2023-08-28 DIAGNOSIS — I739 Peripheral vascular disease, unspecified: Secondary | ICD-10-CM

## 2023-09-02 ENCOUNTER — Ambulatory Visit (HOSPITAL_COMMUNITY)
Admission: RE | Admit: 2023-09-02 | Discharge: 2023-09-02 | Disposition: A | Discharge status: Home / Self Care | Source: Ambulatory Visit | Attending: Urology | Admitting: Urology

## 2023-09-02 DIAGNOSIS — N200 Calculus of kidney: Secondary | ICD-10-CM | POA: Insufficient documentation

## 2023-09-05 NOTE — Progress Notes (Unsigned)
 Patient ID: Adrienne Davidson, female   DOB: [birth year], 68 y.o.   MRN: [id number]  Reason for Consult: No chief complaint on file.   Referred by Eliodoro Guerin, DO  Subjective:  HPI Adrienne Davidson is a 68 y.o. female presenting for evaluation of aortoiliac atherosclerosis.  She has had a history of renal calculi and has undergone CT for further surveillance which demonstrated significant calcifications throughout the aortoiliac segments.  She denies any history or current symptoms of claudication, rest pain, nonhealing wounds or foot ulcerations.  She denies any previous vascular interventions.  She is a former smoker and quit in 2020.  Past Medical History:  Diagnosis Date   Allergic rhinitis    Allergy    Anxiety    Arthritis    Diabetes mellitus (HCC)    Hyperlipidemia    Hypertension    Psoriasis    Bilateral feet   Sleep apnea    Sleep apnea    Vitamin D  deficiency    Family History  Problem Relation Age of Onset   Non-Hodgkin's lymphoma Mother    Arthritis Mother    Cancer Mother    Heart disease Father    Arthritis Father    COPD Father    Hypertension Father    Diabetes Daughter    Rheum arthritis Daughter    Polycystic ovary syndrome Daughter    Breast cancer Neg Hx    Past Surgical History:  Procedure Laterality Date   APPENDECTOMY     CESAREAN SECTION  1980   CESAREAN SECTION  1984   IR URETERAL STENT RIGHT NEW ACCESS W/O SEP NEPHROSTOMY CATH  05/22/2023   NEPHROLITHOTOMY Left 05/23/2023   Procedure: NEPHROLITHOTOMY PERCUTANEOUS;  Surgeon: Marco Severs, MD;  Location: [facility];  Service: Urology;  Laterality: Left;   NEPHROLITHOTOMY Left 06/24/2023   Procedure: NEPHROLITHOTOMY PERCUTANEOUS- second look;  Surgeon: Marco Severs, MD;  Location: [facility];  Service: Urology;  Laterality: Left;   TONSILLECTOMY      Short Social History:  Social History   Tobacco Use   Smoking status: Former    Current packs/day: 0.00    Average packs/day:  0.3 packs/day for 25.0 years (6.3 ttl pk-yrs)    Types: Cigarettes    Start date: 06/26/1993    Quit date: 06/27/2018    Years since quitting: 5.1   Smokeless tobacco: Never  Substance Use Topics   Alcohol use: Not Currently    Comment: wine once per year    Allergies  Allergen Reactions   Enalapril Anaphylaxis    Angioedema Aug 2018    Current Outpatient Medications  Medication Sig Dispense Refill   acetaminophen  (TYLENOL ) 500 MG tablet Take 500-1,000 mg by mouth every 6 (six) hours as needed (pain.).     amLODipine  (NORVASC ) 5 MG tablet Take 1 tablet (5 mg total) by mouth daily. (Patient taking differently: Take 5 mg by mouth every evening.) 100 tablet 3   Cholecalciferol (D-3-5) 125 MCG (5000 UT) capsule Take 5,000 Units by mouth in the morning.     Coenzyme Q10 200 MG capsule Take 200 mg by mouth at bedtime.     CRANBERRY PO Take 504 mg by mouth at bedtime.     dapagliflozin  propanediol (FARXIGA ) 5 MG TABS tablet Take 1 tablet (5 mg total) by mouth daily. 90 tablet 3   Dulaglutide  (TRULICITY ) 3 MG/0.5ML SOPN Inject 3 mg as directed once a week. 6 mL 3   EPINEPHrine  0.3 mg/0.3 mL IJ  SOAJ injection Inject 0.3 mg into the muscle as needed for anaphylaxis. Then call 911 2 each 0   glucose blood test strip Test blood sugar once daily (one touch verio if covered by ins/ otherwise whatever ins preference is) E11.65 100 each 12   Krill Oil 500 MG CAPS Take 500 mg by mouth in the morning.     Lancets (ONETOUCH ULTRASOFT) lancets Use once daily as instructed E11.65 (one touch verio) 100 each 12   LORazepam  (ATIVAN ) 0.5 MG tablet Take 0.5-1 tablets (0.25-0.5 mg total) by mouth 2 (two) times daily as needed for anxiety. 10 tablet 0   metFORMIN  (GLUCOPHAGE ) 1000 MG tablet Take 1 tablet (1,000 mg total) by mouth 2 (two) times daily with a meal. 200 tablet 3   Multiple Vitamin (MULTIVITAMIN WITH MINERALS) TABS tablet Take 1 tablet by mouth in the morning.     OVER THE COUNTER MEDICATION Take 1  tablet by mouth daily. Nervive supplement     rosuvastatin  (CRESTOR ) 10 MG tablet Take 1 tablet (10 mg total) by mouth daily. (Patient taking differently: Take 10 mg by mouth every evening.) 100 tablet 3   sertraline  (ZOLOFT ) 100 MG tablet Take 1 tablet (100 mg total) by mouth daily. 100 tablet 3   No current facility-administered medications for this visit.    REVIEW OF SYSTEMS  All other systems reviewed and are negative  Objective:  Objective   There were no vitals filed for this visit. There is no height or weight on file to calculate BMI.  Physical Exam General: no acute distress Cardiac: hemodynamically stable Pulm: normal work of breathing Abdomen: non-tender, no pulsatile mass*** Neuro: alert, no focal deficit Extremities: no edema, cyanosis or wounds*** Vascular:   Right: ***  Left: ***  Data: CT renal stone study from February independently reviewed Significant calcific atherosclerotic disease throughout the aorta, iliacs and common femoral arteries.  ABI ***     Assessment/Plan:     SALIMAH OTTUM is a 68 y.o. female with asymptomatic peripheral arterial disease.  We discussed the staging as well as the natural history and risk factors of PAD.  I explained that there is no indication for intervention for asymptomatic peripheral arterial disease.  We discussed best medical therapy of aspirin, statin and abstinence from tobacco products.    Will plan to see her back in follow-up in 1 year with an ABI. Instructed to call for earlier appointment should she develop rest pain or foot ulceration.  Recommendations to optimize cardiovascular risk: Abstinence from all tobacco products. Blood glucose control with goal A1c < 7%. Blood pressure control with goal blood pressure < 140/90 mmHg. Lipid reduction therapy with goal LDL-C <100 mg/dL  Aspirin 81mg  PO QD.  Atorvastatin 40-80mg  PO QD (or other "high intensity" statin therapy).     Philipp Brawn MD [facility]

## 2023-09-06 ENCOUNTER — Ambulatory Visit: Attending: Vascular Surgery | Admitting: Vascular Surgery

## 2023-09-06 ENCOUNTER — Encounter: Payer: Self-pay | Admitting: Vascular Surgery

## 2023-09-06 ENCOUNTER — Ambulatory Visit (HOSPITAL_COMMUNITY)
Admission: RE | Admit: 2023-09-06 | Discharge: 2023-09-06 | Disposition: A | Discharge status: Home / Self Care | Source: Ambulatory Visit | Attending: Vascular Surgery | Admitting: Vascular Surgery

## 2023-09-06 VITALS — BP 140/79 | HR 67 | Temp 97.3°F | Resp 18 | Ht 62.0 in | Wt 163.0 lb

## 2023-09-06 DIAGNOSIS — I739 Peripheral vascular disease, unspecified: Secondary | ICD-10-CM | POA: Insufficient documentation

## 2023-09-06 DIAGNOSIS — I700 Atherosclerosis of aorta: Secondary | ICD-10-CM | POA: Diagnosis not present

## 2023-09-06 LAB — VAS US ABI WITH/WO TBI
Left ABI: 1.16
Right ABI: 1.03

## 2023-09-11 ENCOUNTER — Encounter: Payer: Self-pay | Admitting: Urology

## 2023-09-11 ENCOUNTER — Ambulatory Visit: Admitting: Urology

## 2023-09-11 VITALS — BP 136/71 | HR 80

## 2023-09-11 DIAGNOSIS — N200 Calculus of kidney: Secondary | ICD-10-CM

## 2023-09-11 NOTE — Patient Instructions (Signed)
 ESWL for Kidney Stones  Extracorporeal shock wave lithotripsy (ESWL) is a treatment that can help break up kidney stones that are too large to pass on their own.  This is a nonsurgical procedure that breaks up a kidney stone with shock waves. These shock waves pass through your body and focus on the kidney stone. They cause the kidney stone to break into smaller pieces (fragments) while it is still in the urinary tract. The fragments of stone can pass more easily out of your body in the pee (urine). Tell a health care provider about: Any allergies you have. All medicines you are taking, including vitamins, herbs, eye drops, creams, and over-the-counter medicines. Any problems you or family members have had with anesthetic medicines. Any bleeding problems you have. Any surgeries you've had. Any medical conditions you have. Whether you're pregnant or may be pregnant. What are the risks? Your health care provider will talk with you about risks. These may include: Infection. Bleeding from the kidney. Bruising of the kidney or skin. Scarring of the kidney. This can lead to: Increased blood pressure. Poor kidney function. Return (recurrence) of kidney stones. Damage to other structures or organs. This may include the liver, colon, spleen, or pancreas. Blockage (obstruction) of the tube that carries pee from the kidney to the bladder (ureter). Failure of the kidney stone to break into fragments. What happens before the procedure? When to stop eating and drinking Follow instructions from your health care provider about what you may eat and drink. These may include: 8 hours before your procedure Stop eating most foods. Do not eat meat, fried foods, or fatty foods. Eat only light foods, such as toast or crackers. All liquids are okay except energy drinks and alcohol. 6 hours before your procedure Stop eating. Drink only clear liquids, such as water, clear fruit juice, black coffee, plain tea,  and sports drinks. Do not drink energy drinks or alcohol. 2 hours before your procedure Stop drinking all liquids. You may be allowed to take medicines with small sips of water. If you do not follow your health care provider's instructions, your procedure may be delayed or canceled. Medicines Ask your health care provider about: Changing or stopping your regular medicines. These include any diabetes medicines or blood thinners you take. Taking medicines such as aspirin and ibuprofen. These medicines can thin your blood. Do not take them unless your health care provider tells you to. Taking over-the-counter medicines, vitamins, herbs, and supplements. Tests You may have tests, such as: Blood tests. Pee (urine) tests. Imaging tests. This may include a CT scan. Surgery safety Ask your health care provider: How your surgery site will be marked. What steps will be taken to help prevent infection. These steps may include: Washing skin with a soap that kills germs. Receiving antibiotics. General instructions If you will be going home right after the procedure, plan to have a responsible adult: Take you home from the hospital or clinic. You will not be allowed to drive. Care for you for the time you are told. What happens during the procedure?  An IV will be inserted into one of your veins. You may be given: A sedative. This helps you relax. Anesthesia. This will: Numb certain areas of your body. Make you fall asleep for surgery. A water-filled cushion may be placed behind your kidney or on your abdomen. In some cases, you may be placed in a tub of lukewarm water. Your body will be positioned in a way that makes it  easier to target the kidney stone. An X-ray or ultrasound exam will be done to locate your stone. Shock waves will be aimed at the stone. If you are awake, you may feel a tapping sensation as the shock waves pass through your body. A small mesh tube (stent) may be placed in  your ureter. This will help keep pee flowing from the kidney if the fragments of the stone have been blocking the ureter. The stent will be removed at a later time by your health care provider. The procedure may vary among health care providers and hospitals. What happens after the procedure? Your blood pressure, heart rate, breathing rate, and blood oxygen level will be monitored until you leave the hospital or clinic. You may have an X-ray after the procedure to see how many of the kidney stones were broken up. This will also show how much of the stone has passed. If there are still large fragments after treatment, you may need to have a second procedure at a later time. This information is not intended to replace advice given to you by your health care provider. Make sure you discuss any questions you have with your health care provider. Document Revised: 10/20/2022 Document Reviewed: 08/10/2021 Elsevier Patient Education  2024 [employer].

## 2023-09-11 NOTE — H&P (View-Only) (Signed)
 09/11/2023 8:21 AM   Adrienne Davidson Jul 18, 1955 [id number]  Referring provider: Eliodoro Guerin, DO [address] [place],  [place] [address]  [facility]rol[facility]   HPI: Adrienne Davidson is 68yo here for followup for nephrolithiasis. She passed a small left ureteral calculi since last visit. Renal US  5/12 shows 1.7cm right renal pelvis calculus which is seen on scout film for CT in 05/2023. No flank pain currently . No worsening LUTS.    PMH: Past Medical History:  Diagnosis Date   Allergic rhinitis    Allergy    Anxiety    Arthritis    Diabetes mellitus (HCC)    Hyperlipidemia    Hypertension    Psoriasis    Bilateral feet   Sleep apnea    Sleep apnea    Vitamin D  deficiency     Surgical History: Past Surgical History:  Procedure Laterality Date   APPENDECTOMY     CESAREAN SECTION  1980   CESAREAN SECTION  1984   IR URETERAL STENT RIGHT NEW ACCESS W/O SEP NEPHROSTOMY CATH  05/22/2023   NEPHROLITHOTOMY Left 05/23/2023   Procedure: NEPHROLITHOTOMY PERCUTANEOUS;  Surgeon: Marco Severs, MD;  Location: [facility];  Service: Urology;  Laterality: Left;   NEPHROLITHOTOMY Left 06/24/2023   Procedure: NEPHROLITHOTOMY PERCUTANEOUS- second look;  Surgeon: Marco Severs, MD;  Location: [facility];  Service: Urology;  Laterality: Left;   TONSILLECTOMY      Home Medications:  Allergies as of 09/11/2023       Reactions   Enalapril Anaphylaxis   Angioedema Aug 2018        Medication List        Accurate as of Sep 11, 2023  8:21 AM. If you have any questions, ask your nurse or doctor.          acetaminophen  500 MG tablet Commonly known as: TYLENOL  Take 500-1,000 mg by mouth every 6 (six) hours as needed (pain.).   amLODipine  5 MG tablet Commonly known as: NORVASC  Take 1 tablet (5 mg total) by mouth daily. What changed: when to take this   Coenzyme Q10 200 MG capsule Take 200 mg by mouth at bedtime.   CRANBERRY PO Take 504 mg by mouth at bedtime.    D-3-5 125 MCG (5000 UT) capsule Generic drug: Cholecalciferol Take 5,000 Units by mouth in the morning.   dapagliflozin  propanediol 5 MG Tabs tablet Commonly known as: Farxiga  Take 1 tablet (5 mg total) by mouth daily.   EPINEPHrine  0.3 mg/0.3 mL Soaj injection Commonly known as: EPI-PEN Inject 0.3 mg into the muscle as needed for anaphylaxis. Then call 911   glucose blood test strip Test blood sugar once daily (one touch verio if covered by ins/ otherwise whatever ins preference is) E11.65   Krill Oil 500 MG Caps Take 500 mg by mouth in the morning.   LORazepam  0.5 MG tablet Commonly known as: ATIVAN  Take 0.5-1 tablets (0.25-0.5 mg total) by mouth 2 (two) times daily as needed for anxiety.   metFORMIN  1000 MG tablet Commonly known as: GLUCOPHAGE  Take 1 tablet (1,000 mg total) by mouth 2 (two) times daily with a meal.   multivitamin with minerals Tabs tablet Take 1 tablet by mouth in the morning.   onetouch ultrasoft lancets Use once daily as instructed E11.65 (one touch verio)   OVER THE COUNTER MEDICATION Take 1 tablet by mouth daily. Nervive supplement   rosuvastatin  10 MG tablet Commonly known as: CRESTOR  Take 1 tablet (10 mg total)  by mouth daily. What changed: when to take this   sertraline  100 MG tablet Commonly known as: ZOLOFT  Take 1 tablet (100 mg total) by mouth daily.   Trulicity  3 MG/0.5ML Soaj Generic drug: Dulaglutide  Inject 3 mg as directed once a week.        Allergies:  Allergies  Allergen Reactions   Enalapril Anaphylaxis    Angioedema Aug 2018    Family History: Family History  Problem Relation Age of Onset   Non-Hodgkin's lymphoma Mother    Arthritis Mother    Cancer Mother    Heart disease Father    Arthritis Father    COPD Father    Hypertension Father    Diabetes Daughter    Rheum arthritis Daughter    Polycystic ovary syndrome Daughter    Breast cancer Neg Hx     Social History:  reports that she quit smoking about  5 years ago. Her smoking use included cigarettes. She started smoking about 30 years ago. She has a 6.3 pack-year smoking history. She has never used smokeless tobacco. She reports that she does not currently use alcohol. She reports that she does not currently use drugs.  ROS: All other review of systems were reviewed and are negative except what is noted above in HPI  Physical Exam: BP 136/71   Pulse 80   Constitutional:  Alert and oriented, No acute distress. HEENT: Adrienne Davidson AT, moist mucus membranes.  Trachea midline, no masses. Cardiovascular: No clubbing, cyanosis, or edema. Respiratory: Normal respiratory effort, no increased work of breathing. GI: Abdomen is soft, nontender, nondistended, no abdominal masses GU: No CVA tenderness.  Lymph: No cervical or inguinal lymphadenopathy. Skin: No rashes, bruises or suspicious lesions. Neurologic: Grossly intact, no focal deficits, moving all 4 extremities. Psychiatric: Normal mood and affect.  Laboratory Data: Lab Results  Component Value Date   WBC 8.0 05/24/2023   HGB 11.7 (L) 05/24/2023   HCT 36.8 05/24/2023   MCV 82.3 05/24/2023   PLT 216 05/24/2023    Lab Results  Component Value Date   CREATININE 0.83 05/24/2023    No results found for: "PSA"  No results found for: "TESTOSTERONE"  Lab Results  Component Value Date   HGBA1C 6.7 (H) 05/21/2023    Urinalysis    Component Value Date/Time   APPEARANCEUR Cloudy (A) 07/10/2023 0853   GLUCOSEU 3+ (A) 07/10/2023 0853   BILIRUBINUR Negative 07/10/2023 0853   PROTEINUR 1+ (A) 07/10/2023 0853   NITRITE Positive (A) 07/10/2023 0853   LEUKOCYTESUR 2+ (A) 07/10/2023 0853    Lab Results  Component Value Date   LABMICR See below: 07/10/2023   WBCUA >30 (A) 07/10/2023   LABEPIT 0-10 07/10/2023   BACTERIA Many (A) 07/10/2023    Pertinent Imaging: Renal US  09/02/2023: Images reviewed and discussed with the patient  No results found for this or any previous visit.  No  results found for this or any previous visit.  No results found for this or any previous visit.  No results found for this or any previous visit.  Results for orders placed during the hospital encounter of 09/02/23  US  RENAL  Narrative CLINICAL DATA:  Nephrolithiasis follow-up.  EXAM: RENAL / URINARY TRACT ULTRASOUND COMPLETE  COMPARISON:  CT renal stone protocol May 28, 2023  FINDINGS: Right Kidney:  Renal measurements: 10.9 x 5.4 x 5.6 cm = volume: 170.4 mL. 1.7 cm nonobstructing stone identified in the midpole right kidney/renal pelvis. Echogenicity within normal limits. No mass or hydronephrosis visualized.  Left Kidney:  Renal measurements: 9.7 x 5.4 x 5 cm = volume: 136 mL. 1.4 cm nonobstructing stone identified in midpole left kidney. echogenicity within normal limits. No mass or hydronephrosis visualized.  Bladder:  Appears normal for degree of bladder distention.  Other:  None.  IMPRESSION: Bilateral nonobstructing renal stones. No hydronephrosis bilaterally.   Electronically Signed By: Anna Barnes M.D. On: 09/02/2023 15:32  No results found for this or any previous visit.  No results found for this or any previous visit.  Results for orders placed during the hospital encounter of 05/28/23  CT RENAL STONE STUDY  Narrative CLINICAL DATA:  68 year old female with staghorn calculi, left nephrostomy.  EXAM: CT ABDOMEN AND PELVIS WITHOUT CONTRAST  TECHNIQUE: Multidetector CT imaging of the abdomen and pelvis was performed following the standard protocol without IV contrast.  RADIATION DOSE REDUCTION: This exam was performed according to the departmental dose-optimization program which includes automated exposure control, adjustment of the mA and/or kV according to patient size and/or use of iterative reconstruction technique.  COMPARISON:  CT Abdomen and Pelvis 04/12/2023.  FINDINGS: Lower chest: Heart size remains normal. No  pericardial or pleural effusion. Negative lung bases.  Hepatobiliary: Negative noncontrast liver and gallbladder.  Pancreas: Negative.  Spleen: Negative.  Adrenals/Urinary Tract: Normal adrenal glands.  Large right mid and lower pole staghorn renal calculi appears stable since December, up to 3 cm. Right kidney and ureter appear nonobstructed.  New percutaneous left nephrostomy tube, which enters the left lower pole collecting system, and superimposed right double-J ureteral stent. The proximal stent pigtail appears formed mostly in the proximal ureter just below the ureteropelvic junction (coronal image 49). New left renal collecting system gas. Underlying regressed but not resolved widespread left renal staghorn calculi. Mild new left pararenal space inflammation, and trace mildly complex pararenal space fluid (series 2, image 33). The left ureter is nondilated along the course of the stent. There is redundant distal ureteral stent in the urinary bladder with a small volume of gas. No calculus identified along the course of the stent.  Stomach/Bowel: Nondilated large and small bowel. Sigmoid diverticulosis redemonstrated. Evidence of previous appendectomy. No free air, free fluid, or bowel inflammation identified. Moderate retained food and fluid in the stomach.  Vascular/Lymphatic: Severe Aortoiliac calcified atherosclerosis. Vascular patency is not evaluated in the absence of IV contrast. Normal caliber abdominal aorta. No lymphadenopathy identified.  Reproductive: Negative noncontrast appearance.  Other: No pelvis free fluid.  Musculoskeletal: Severe lumbar spine degeneration superimposed on degenerative appearing ankylosis of the lumbosacral junction. Flowing lower thoracic endplate osteophytes and ankylosis. Chronic pubic symphysis degeneration. No acute osseous abnormality identified.  IMPRESSION: 1. New left nephrostomy tube which accesses the left lower  pole collecting system. Superimposed double-J left ureteral stent may have migrated distally since initial placement: The proximal stent pigtail is formed more in the proximal ureter than the UPJ. Trace left pararenal space blood or complex fluid.  2. Gas in the left renal collecting system with regressed but not resolved extensive left kidney staghorn calculi. Nondilated left ureter. Stable contralateral nonobstructing right renal staghorn calculi  3. No other acute or inflammatory process identified in the noncontrast abdomen or pelvis. Advanced aortic Atherosclerosis (ICD10-I70.0). Advanced spine degeneration.   Electronically Signed By: Marlise Simpers M.D. On: 05/28/2023 11:40   Assessment & Plan:    1. Kidney stones (Primary) -We discussed the management of kidney stones. These options include observation, ureteroscopy, shockwave lithotripsy (ESWL) and percutaneous nephrolithotomy (PCNL). We discussed which options are  relevant to the patient's stone(s). We discussed the natural history of kidney stones as well as the complications of untreated stones and the impact on quality of life without treatment as well as with each of the above listed treatments. We also discussed the efficacy of each treatment in its ability to clear the stone burden. With any of these management options I discussed the signs and symptoms of infection and the need for emergent treatment should these be experienced. For each option we discussed the ability of each procedure to clear the patient of their stone burden.   For observation I described the risks which include but are not limited to silent renal damage, life-threatening infection, need for emergent surgery, failure to pass stone and pain.   For ureteroscopy I described the risks which include bleeding, infection, damage to contiguous structures, positioning injury, ureteral stricture, ureteral avulsion, ureteral injury, need for prolonged ureteral stent,  inability to perform ureteroscopy, need for an interval procedure, inability to clear stone burden, stent discomfort/pain, heart attack, stroke, pulmonary embolus and the inherent risks with general anesthesia.   For shockwave lithotripsy I described the risks which include arrhythmia, kidney contusion, kidney hemorrhage, need for transfusion, pain, inability to adequately break up stone, inability to pass stone fragments, Steinstrasse, infection associated with obstructing stones, need for alternate surgical procedure, need for repeat shockwave lithotripsy, MI, CVA, PE and the inherent risks with anesthesia/conscious sedation.   For PCNL I described the risks including positioning injury, pneumothorax, hydrothorax, need for chest tube, inability to clear stone burden, renal laceration, arterial venous fistula or malformation, need for embolization of kidney, loss of kidney or renal function, need for repeat procedure, need for prolonged nephrostomy tube, ureteral avulsion, MI, CVA, PE and the inherent risks of general anesthesia.   - The patient would like to proceed with right ESWL  - Urinalysis, Routine w reflex microscopic   No follow-ups on file.  Johnie Nailer, MD  [facility] [place]

## 2023-09-11 NOTE — Progress Notes (Signed)
 09/11/2023 8:21 AM   [name] Jul 18, 1955 [id number]  Referring provider: Eliodoro Guerin, DO [address] [place],  [place] [address]  [place] nephrolithiasis   HPI: [name] is 68yo here for followup for nephrolithiasis. She passed a small left ureteral calculi since last visit. Renal US  5/12 shows 1.7cm right renal pelvis calculus which is seen on scout film for CT in 05/2023. No flank pain currently . No worsening LUTS.    PMH: Past Medical History:  Diagnosis Date   Allergic rhinitis    Allergy    Anxiety    Arthritis    Diabetes mellitus (HCC)    Hyperlipidemia    Hypertension    Psoriasis    Bilateral feet   Sleep apnea    Sleep apnea    Vitamin D  deficiency     Surgical History: Past Surgical History:  Procedure Laterality Date   APPENDECTOMY     CESAREAN SECTION  1980   CESAREAN SECTION  1984   IR URETERAL STENT RIGHT NEW ACCESS W/O SEP NEPHROSTOMY CATH  05/22/2023   NEPHROLITHOTOMY Left 05/23/2023   Procedure: NEPHROLITHOTOMY PERCUTANEOUS;  Surgeon: Marco Severs, MD;  Location: [facility];  Service: Urology;  Laterality: Left;   NEPHROLITHOTOMY Left 06/24/2023   Procedure: NEPHROLITHOTOMY PERCUTANEOUS- second look;  Surgeon: Marco Severs, MD;  Location: [facility];  Service: Urology;  Laterality: Left;   TONSILLECTOMY      Home Medications:  Allergies as of 09/11/2023       Reactions   Enalapril Anaphylaxis   Angioedema Aug 2018        Medication List        Accurate as of Sep 11, 2023  8:21 AM. If you have any questions, ask your nurse or doctor.          acetaminophen  500 MG tablet Commonly known as: TYLENOL  Take 500-1,000 mg by mouth every 6 (six) hours as needed (pain.).   amLODipine  5 MG tablet Commonly known as: NORVASC  Take 1 tablet (5 mg total) by mouth daily. What changed: when to take this   Coenzyme Q10 200 MG capsule Take 200 mg by mouth at bedtime.   CRANBERRY PO Take 504 mg by mouth at bedtime.    D-3-5 125 MCG (5000 UT) capsule Generic drug: Cholecalciferol Take 5,000 Units by mouth in the morning.   dapagliflozin  propanediol 5 MG Tabs tablet Commonly known as: Farxiga  Take 1 tablet (5 mg total) by mouth daily.   EPINEPHrine  0.3 mg/0.3 mL Soaj injection Commonly known as: EPI-PEN Inject 0.3 mg into the muscle as needed for anaphylaxis. Then call 911   glucose blood test strip Test blood sugar once daily (one touch verio if covered by ins/ otherwise whatever ins preference is) E11.65   Krill Oil 500 MG Caps Take 500 mg by mouth in the morning.   LORazepam  0.5 MG tablet Commonly known as: ATIVAN  Take 0.5-1 tablets (0.25-0.5 mg total) by mouth 2 (two) times daily as needed for anxiety.   metFORMIN  1000 MG tablet Commonly known as: GLUCOPHAGE  Take 1 tablet (1,000 mg total) by mouth 2 (two) times daily with a meal.   multivitamin with minerals Tabs tablet Take 1 tablet by mouth in the morning.   onetouch ultrasoft lancets Use once daily as instructed E11.65 (one touch verio)   OVER THE COUNTER MEDICATION Take 1 tablet by mouth daily. Nervive supplement   rosuvastatin  10 MG tablet Commonly known as: CRESTOR  Take 1 tablet (10 mg total)  by mouth daily. What changed: when to take this   sertraline  100 MG tablet Commonly known as: ZOLOFT  Take 1 tablet (100 mg total) by mouth daily.   Trulicity  3 MG/0.5ML Soaj Generic drug: Dulaglutide  Inject 3 mg as directed once a week.        Allergies:  Allergies  Allergen Reactions   Enalapril Anaphylaxis    Angioedema Aug 2018    Family History: Family History  Problem Relation Age of Onset   Non-Hodgkin's lymphoma Mother    Arthritis Mother    Cancer Mother    Heart disease Father    Arthritis Father    COPD Father    Hypertension Father    Diabetes Daughter    Rheum arthritis Daughter    Polycystic ovary syndrome Daughter    Breast cancer Neg Hx     Social History:  reports that she quit smoking about  5 years ago. Her smoking use included cigarettes. She started smoking about 30 years ago. She has a 6.3 pack-year smoking history. She has never used smokeless tobacco. She reports that she does not currently use alcohol. She reports that she does not currently use drugs.  ROS: All other review of systems were reviewed and are negative except what is noted above in HPI  Physical Exam: BP 136/71   Pulse 80   Constitutional:  Alert and oriented, No acute distress. HEENT: [place] AT, moist mucus membranes.  Trachea midline, no masses. Cardiovascular: No clubbing, cyanosis, or edema. Respiratory: Normal respiratory effort, no increased work of breathing. GI: Abdomen is soft, nontender, nondistended, no abdominal masses GU: No CVA tenderness.  Lymph: No cervical or inguinal lymphadenopathy. Skin: No rashes, bruises or suspicious lesions. Neurologic: Grossly intact, no focal deficits, moving all 4 extremities. Psychiatric: Normal mood and affect.  Laboratory Data: Lab Results  Component Value Date   WBC 8.0 05/24/2023   HGB 11.7 (L) 05/24/2023   HCT 36.8 05/24/2023   MCV 82.3 05/24/2023   PLT 216 05/24/2023    Lab Results  Component Value Date   CREATININE 0.83 05/24/2023    No results found for: "PSA"  No results found for: "TESTOSTERONE"  Lab Results  Component Value Date   HGBA1C 6.7 (H) 05/21/2023    Urinalysis    Component Value Date/Time   APPEARANCEUR Cloudy (A) 07/10/2023 0853   GLUCOSEU 3+ (A) 07/10/2023 0853   BILIRUBINUR Negative 07/10/2023 0853   PROTEINUR 1+ (A) 07/10/2023 0853   NITRITE Positive (A) 07/10/2023 0853   LEUKOCYTESUR 2+ (A) 07/10/2023 0853    Lab Results  Component Value Date   LABMICR See below: 07/10/2023   WBCUA >30 (A) 07/10/2023   LABEPIT 0-10 07/10/2023   BACTERIA Many (A) 07/10/2023    Pertinent Imaging: Renal US  09/02/2023: Images reviewed and discussed with the patient  No results found for this or any previous visit.  No  results found for this or any previous visit.  No results found for this or any previous visit.  No results found for this or any previous visit.  Results for orders placed during the hospital encounter of 09/02/23  US  RENAL  Narrative CLINICAL DATA:  Nephrolithiasis follow-up.  EXAM: RENAL / URINARY TRACT ULTRASOUND COMPLETE  COMPARISON:  CT renal stone protocol May 28, 2023  FINDINGS: Right Kidney:  Renal measurements: 10.9 x 5.4 x 5.6 cm = volume: 170.4 mL. 1.7 cm nonobstructing stone identified in the midpole right kidney/renal pelvis. Echogenicity within normal limits. No mass or hydronephrosis visualized.  Left Kidney:  Renal measurements: 9.7 x 5.4 x 5 cm = volume: 136 mL. 1.4 cm nonobstructing stone identified in midpole left kidney. echogenicity within normal limits. No mass or hydronephrosis visualized.  Bladder:  Appears normal for degree of bladder distention.  Other:  None.  IMPRESSION: Bilateral nonobstructing renal stones. No hydronephrosis bilaterally.   Electronically Signed By: Anna Barnes M.D. On: 09/02/2023 15:32  No results found for this or any previous visit.  No results found for this or any previous visit.  Results for orders placed during the hospital encounter of 05/28/23  CT RENAL STONE STUDY  Narrative CLINICAL DATA:  68 year old female with staghorn calculi, left nephrostomy.  EXAM: CT ABDOMEN AND PELVIS WITHOUT CONTRAST  TECHNIQUE: Multidetector CT imaging of the abdomen and pelvis was performed following the standard protocol without IV contrast.  RADIATION DOSE REDUCTION: This exam was performed according to the departmental dose-optimization program which includes automated exposure control, adjustment of the mA and/or kV according to patient size and/or use of iterative reconstruction technique.  COMPARISON:  CT Abdomen and Pelvis 04/12/2023.  FINDINGS: Lower chest: Heart size remains normal. No  pericardial or pleural effusion. Negative lung bases.  Hepatobiliary: Negative noncontrast liver and gallbladder.  Pancreas: Negative.  Spleen: Negative.  Adrenals/Urinary Tract: Normal adrenal glands.  Large right mid and lower pole staghorn renal calculi appears stable since December, up to 3 cm. Right kidney and ureter appear nonobstructed.  New percutaneous left nephrostomy tube, which enters the left lower pole collecting system, and superimposed right double-J ureteral stent. The proximal stent pigtail appears formed mostly in the proximal ureter just below the ureteropelvic junction (coronal image 49). New left renal collecting system gas. Underlying regressed but not resolved widespread left renal staghorn calculi. Mild new left pararenal space inflammation, and trace mildly complex pararenal space fluid (series 2, image 33). The left ureter is nondilated along the course of the stent. There is redundant distal ureteral stent in the urinary bladder with a small volume of gas. No calculus identified along the course of the stent.  Stomach/Bowel: Nondilated large and small bowel. Sigmoid diverticulosis redemonstrated. Evidence of previous appendectomy. No free air, free fluid, or bowel inflammation identified. Moderate retained food and fluid in the stomach.  Vascular/Lymphatic: Severe Aortoiliac calcified atherosclerosis. Vascular patency is not evaluated in the absence of IV contrast. Normal caliber abdominal aorta. No lymphadenopathy identified.  Reproductive: Negative noncontrast appearance.  Other: No pelvis free fluid.  Musculoskeletal: Severe lumbar spine degeneration superimposed on degenerative appearing ankylosis of the lumbosacral junction. Flowing lower thoracic endplate osteophytes and ankylosis. Chronic pubic symphysis degeneration. No acute osseous abnormality identified.  IMPRESSION: 1. New left nephrostomy tube which accesses the left lower  pole collecting system. Superimposed double-J left ureteral stent may have migrated distally since initial placement: The proximal stent pigtail is formed more in the proximal ureter than the UPJ. Trace left pararenal space blood or complex fluid.  2. Gas in the left renal collecting system with regressed but not resolved extensive left kidney staghorn calculi. Nondilated left ureter. Stable contralateral nonobstructing right renal staghorn calculi  3. No other acute or inflammatory process identified in the noncontrast abdomen or pelvis. Advanced aortic Atherosclerosis (ICD10-I70.0). Advanced spine degeneration.   Electronically Signed By: Marlise Simpers M.D. On: 05/28/2023 11:40   Assessment & Plan:    1. Kidney stones (Primary) -We discussed the management of kidney stones. These options include observation, ureteroscopy, shockwave lithotripsy (ESWL) and percutaneous nephrolithotomy (PCNL). We discussed which options are  relevant to the patient's stone(s). We discussed the natural history of kidney stones as well as the complications of untreated stones and the impact on quality of life without treatment as well as with each of the above listed treatments. We also discussed the efficacy of each treatment in its ability to clear the stone burden. With any of these management options I discussed the signs and symptoms of infection and the need for emergent treatment should these be experienced. For each option we discussed the ability of each procedure to clear the patient of their stone burden.   For observation I described the risks which include but are not limited to silent renal damage, life-threatening infection, need for emergent surgery, failure to pass stone and pain.   For ureteroscopy I described the risks which include bleeding, infection, damage to contiguous structures, positioning injury, ureteral stricture, ureteral avulsion, ureteral injury, need for prolonged ureteral stent,  inability to perform ureteroscopy, need for an interval procedure, inability to clear stone burden, stent discomfort/pain, heart attack, stroke, pulmonary embolus and the inherent risks with general anesthesia.   For shockwave lithotripsy I described the risks which include arrhythmia, kidney contusion, kidney hemorrhage, need for transfusion, pain, inability to adequately break up stone, inability to pass stone fragments, Steinstrasse, infection associated with obstructing stones, need for alternate surgical procedure, need for repeat shockwave lithotripsy, MI, CVA, PE and the inherent risks with anesthesia/conscious sedation.   For PCNL I described the risks including positioning injury, pneumothorax, hydrothorax, need for chest tube, inability to clear stone burden, renal laceration, arterial venous fistula or malformation, need for embolization of kidney, loss of kidney or renal function, need for repeat procedure, need for prolonged nephrostomy tube, ureteral avulsion, MI, CVA, PE and the inherent risks of general anesthesia.   - The patient would like to proceed with right ESWL  - Urinalysis, Routine w reflex microscopic   No follow-ups on file.  Johnie Nailer, MD  [facility] [place]

## 2023-09-12 ENCOUNTER — Other Ambulatory Visit: Payer: Self-pay

## 2023-09-12 DIAGNOSIS — N200 Calculus of kidney: Secondary | ICD-10-CM

## 2023-10-01 ENCOUNTER — Other Ambulatory Visit: Payer: Self-pay | Admitting: Family Medicine

## 2023-10-01 DIAGNOSIS — R809 Proteinuria, unspecified: Secondary | ICD-10-CM

## 2023-10-04 ENCOUNTER — Encounter (HOSPITAL_COMMUNITY): Payer: Self-pay

## 2023-10-04 ENCOUNTER — Other Ambulatory Visit: Payer: Self-pay

## 2023-10-04 ENCOUNTER — Encounter (HOSPITAL_COMMUNITY)
Admission: RE | Admit: 2023-10-04 | Discharge: 2023-10-04 | Disposition: A | Discharge status: Home / Self Care | Source: Ambulatory Visit | Attending: Urology | Admitting: Urology

## 2023-10-06 IMAGING — DX DG CHEST 2V
2 series · 2 of 2 positions shown · non-contrast
Comparison: No priors.

CLINICAL DATA: 65-year-old female with history of dyspnea on
exertion. Coarse breath sounds in the right upper lobe.

EXAM:
CHEST - 2 VIEW

[chest pa]
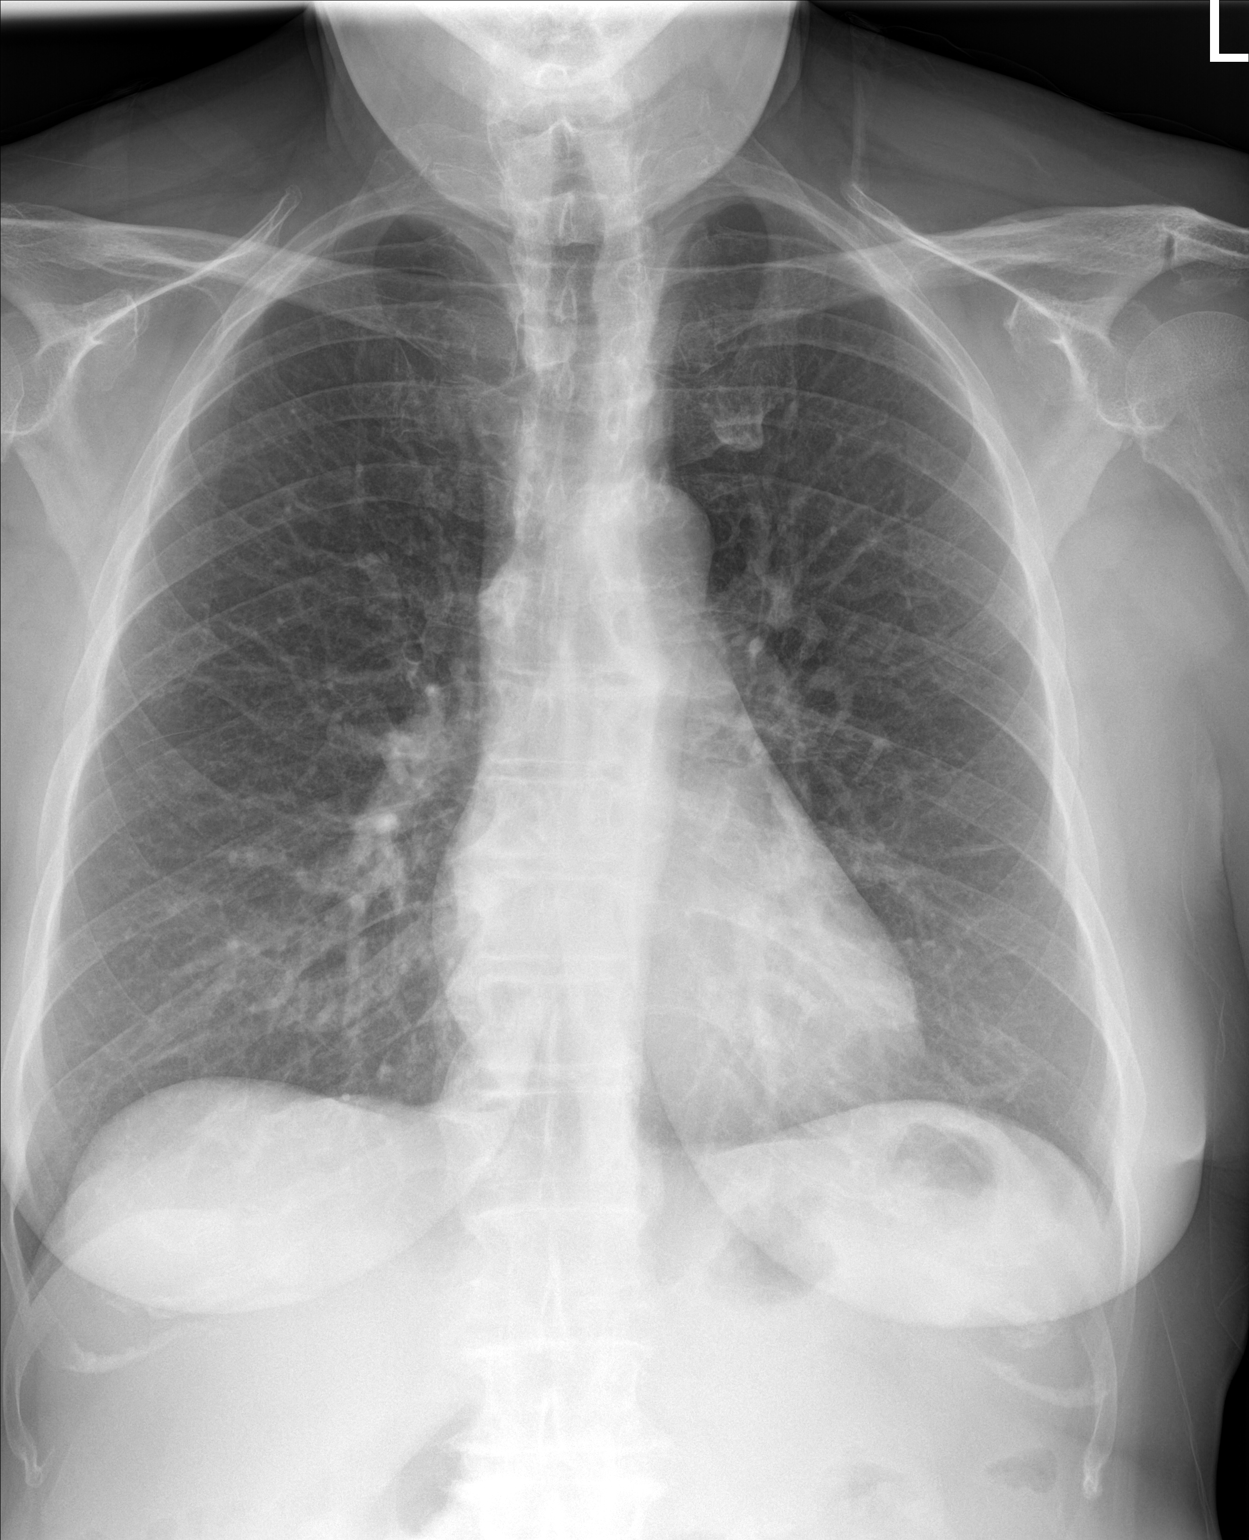

[chest lat]
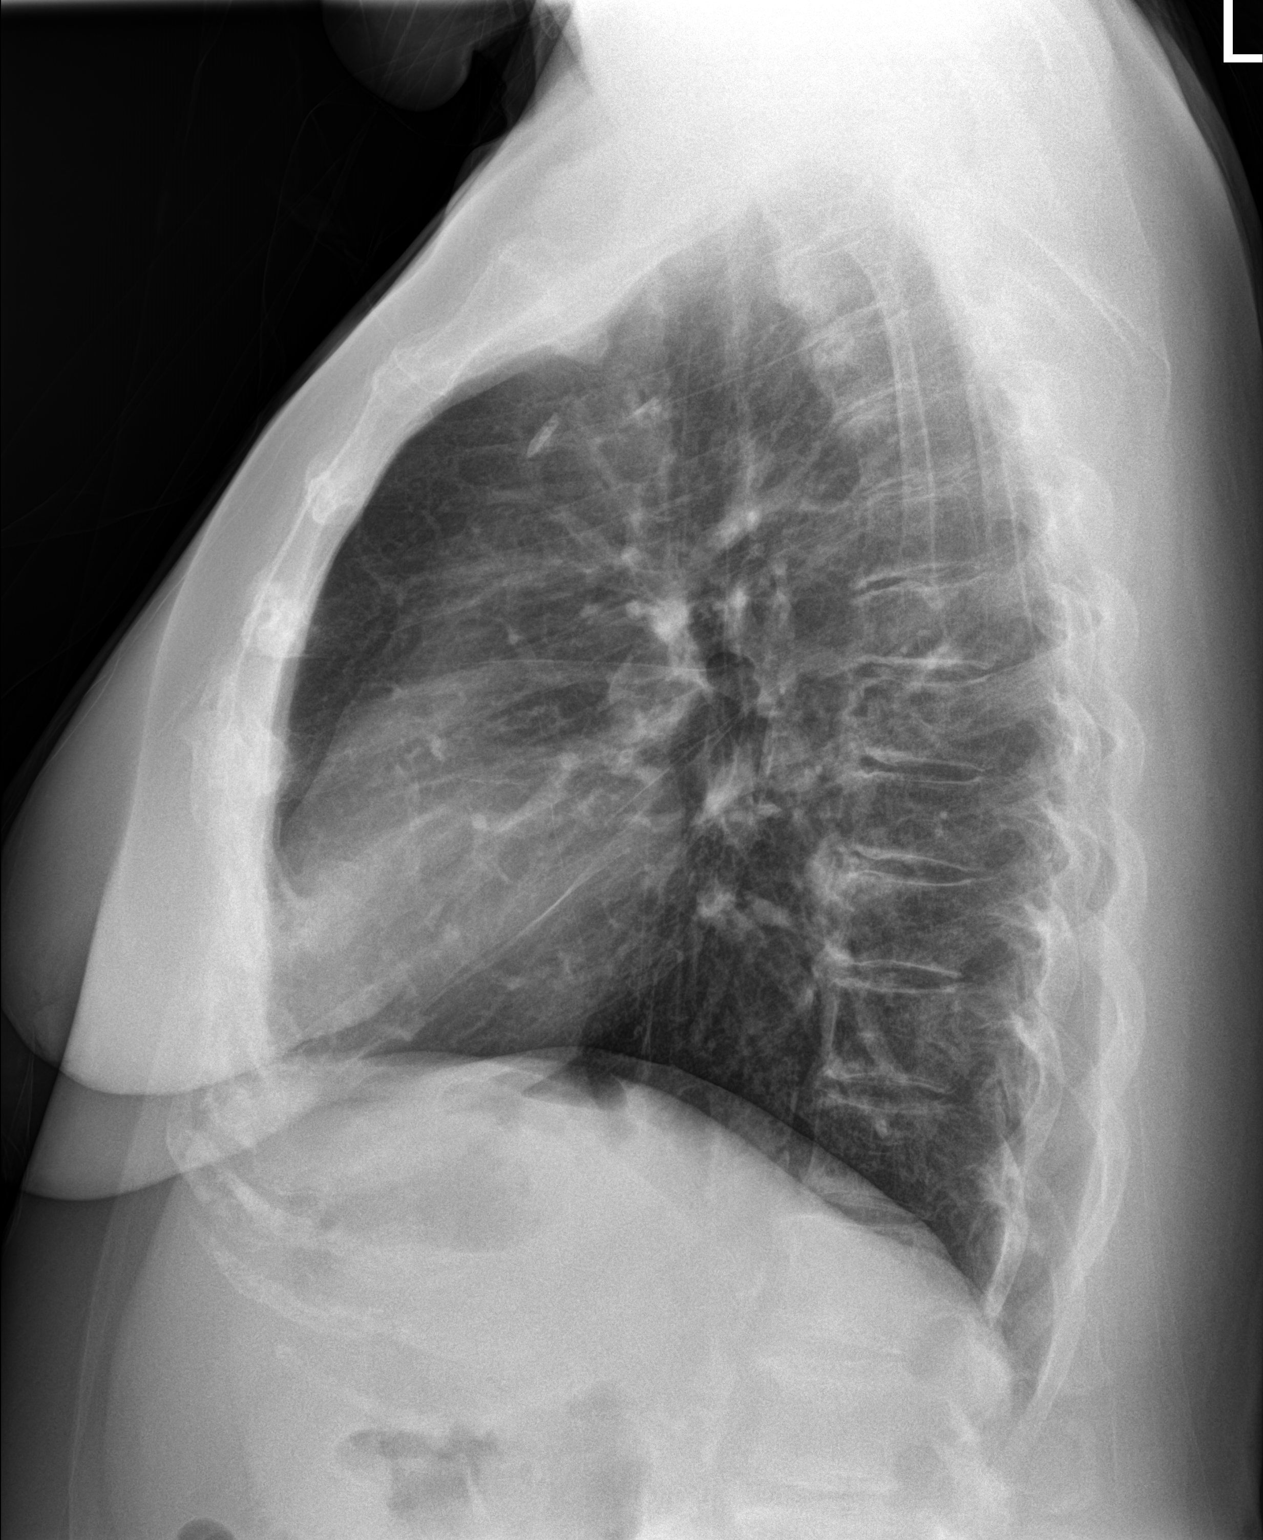

[2 of 2 positions shown; findings below may reference images not displayed]

FINDINGS: Lung volumes are normal. No consolidative airspace disease. No
pleural effusions. No pneumothorax. No pulmonary nodule or mass
noted. Pulmonary vasculature and the cardiomediastinal silhouette
are within normal limits.
IMPRESSION: No radiographic evidence of acute cardiopulmonary disease.

## 2023-10-08 ENCOUNTER — Encounter (HOSPITAL_COMMUNITY): Payer: Self-pay | Admitting: Urology

## 2023-10-08 ENCOUNTER — Ambulatory Visit (HOSPITAL_COMMUNITY)
Admission: RE | Admit: 2023-10-08 | Discharge: 2023-10-08 | Disposition: A | Discharge status: Home / Self Care | Source: Ambulatory Visit | Attending: Urology | Admitting: Urology

## 2023-10-08 ENCOUNTER — Ambulatory Visit (HOSPITAL_COMMUNITY)

## 2023-10-08 ENCOUNTER — Encounter (HOSPITAL_COMMUNITY)
Admission: RE | Disposition: A | Discharge status: Home / Self Care | Payer: Self-pay | Source: Ambulatory Visit | Attending: Urology

## 2023-10-08 DIAGNOSIS — G4730 Sleep apnea, unspecified: Secondary | ICD-10-CM | POA: Insufficient documentation

## 2023-10-08 DIAGNOSIS — I10 Essential (primary) hypertension: Secondary | ICD-10-CM | POA: Insufficient documentation

## 2023-10-08 DIAGNOSIS — N200 Calculus of kidney: Secondary | ICD-10-CM | POA: Diagnosis not present

## 2023-10-08 DIAGNOSIS — Z87891 Personal history of nicotine dependence: Secondary | ICD-10-CM | POA: Diagnosis not present

## 2023-10-08 DIAGNOSIS — N2889 Other specified disorders of kidney and ureter: Secondary | ICD-10-CM | POA: Diagnosis not present

## 2023-10-08 DIAGNOSIS — E119 Type 2 diabetes mellitus without complications: Secondary | ICD-10-CM | POA: Diagnosis not present

## 2023-10-08 DIAGNOSIS — Z7984 Long term (current) use of oral hypoglycemic drugs: Secondary | ICD-10-CM | POA: Diagnosis not present

## 2023-10-08 DIAGNOSIS — K551 Chronic vascular disorders of intestine: Secondary | ICD-10-CM | POA: Diagnosis not present

## 2023-10-08 HISTORY — PX: EXTRACORPOREAL SHOCK WAVE LITHOTRIPSY: SHX1557

## 2023-10-08 LAB — GLUCOSE, CAPILLARY: Glucose-Capillary: 117 mg/dL — ABNORMAL HIGH (ref 70–99)

## 2023-10-08 SURGERY — LITHOTRIPSY, ESWL
Anesthesia: LOCAL | Laterality: Right

## 2023-10-08 MED ORDER — OXYCODONE-ACETAMINOPHEN 5-325 MG PO TABS: 1.0000 | ORAL_TABLET | ORAL | 0 refills | Status: DC | PRN

## 2023-10-08 MED ORDER — ONDANSETRON HCL 4 MG PO TABS: 4.0000 mg | ORAL_TABLET | Freq: Every day | ORAL | 1 refills | Status: DC | PRN

## 2023-10-08 MED ORDER — TAMSULOSIN HCL 0.4 MG PO CAPS: 0.4000 mg | ORAL_CAPSULE | Freq: Every day | ORAL | 0 refills | Status: DC

## 2023-10-08 MED ORDER — DIPHENHYDRAMINE HCL 25 MG PO CAPS
25.0000 mg | ORAL_CAPSULE | ORAL | Status: AC
  Administered 2023-10-08: 25 mg via ORAL
  Filled 2023-10-08: qty 1

## 2023-10-08 MED ORDER — DIAZEPAM 5 MG PO TABS
10.0000 mg | ORAL_TABLET | Freq: Once | ORAL | Status: AC
  Administered 2023-10-08: 10 mg via ORAL
  Filled 2023-10-08: qty 2

## 2023-10-08 NOTE — Interval H&P Note (Signed)
 History and Physical Interval Note:  10/08/2023 7:44 AM  [name]  has presented today for surgery, with the diagnosis of right nephrolithiasis.  The various methods of treatment have been discussed with the patient and family. After consideration of risks, benefits and other options for treatment, the patient has consented to  Procedure(s): LITHOTRIPSY, ESWL (Right) as a surgical intervention.  The patient's history has been reviewed, patient examined, no change in status, stable for surgery.  I have reviewed the patient's chart and labs.  Questions were answered to the patient's satisfaction.     Adrienne Davidson

## 2023-10-08 NOTE — Progress Notes (Addendum)
 Ms Adrienne Davidson completed 1/2 treatment and had a run of Bigeminy PVC's and was transferred to post op to have an EKG.  EKG reviewed by Dr Margrette Shield, anesthesiologist.  EKG sinus bradycardia.  Dr Margrette Shield stated OK to complete lithotripsy and have her follow up with her primary provider.  Copy of EKG and EKG strip given to patient for her primary Physician.  Patient returned to Lithotripsy truck at 1015 to complete procedure.  Returned to [employer] op.

## 2023-10-09 ENCOUNTER — Encounter (HOSPITAL_COMMUNITY): Payer: Self-pay | Admitting: Urology

## 2023-10-16 ENCOUNTER — Ambulatory Visit

## 2023-10-16 ENCOUNTER — Encounter (HOSPITAL_COMMUNITY): Payer: Self-pay | Admitting: Urology

## 2023-10-19 DIAGNOSIS — N200 Calculus of kidney: Secondary | ICD-10-CM | POA: Diagnosis not present

## 2023-10-19 DIAGNOSIS — E871 Hypo-osmolality and hyponatremia: Secondary | ICD-10-CM | POA: Diagnosis not present

## 2023-10-19 DIAGNOSIS — R319 Hematuria, unspecified: Secondary | ICD-10-CM | POA: Diagnosis not present

## 2023-10-20 ENCOUNTER — Emergency Department (HOSPITAL_BASED_OUTPATIENT_CLINIC_OR_DEPARTMENT_OTHER): Admitting: Anesthesiology

## 2023-10-20 ENCOUNTER — Inpatient Hospital Stay (HOSPITAL_COMMUNITY)

## 2023-10-20 ENCOUNTER — Emergency Department (HOSPITAL_COMMUNITY): Admitting: Anesthesiology

## 2023-10-20 ENCOUNTER — Emergency Department (HOSPITAL_COMMUNITY)

## 2023-10-20 ENCOUNTER — Encounter (HOSPITAL_COMMUNITY): Payer: Self-pay | Admitting: Emergency Medicine

## 2023-10-20 ENCOUNTER — Other Ambulatory Visit: Payer: Self-pay

## 2023-10-20 ENCOUNTER — Encounter (HOSPITAL_COMMUNITY)
Admission: EM | Disposition: A | Discharge status: Home / Self Care | Payer: Self-pay | Source: Home / Self Care | Attending: Urology

## 2023-10-20 ENCOUNTER — Observation Stay (HOSPITAL_COMMUNITY)
Admission: EM | Admit: 2023-10-20 | Discharge: 2023-10-21 | Disposition: A | Discharge status: Home / Self Care | Attending: Urology | Admitting: Urology

## 2023-10-20 DIAGNOSIS — I10 Essential (primary) hypertension: Secondary | ICD-10-CM | POA: Diagnosis not present

## 2023-10-20 DIAGNOSIS — E876 Hypokalemia: Secondary | ICD-10-CM

## 2023-10-20 DIAGNOSIS — Z87891 Personal history of nicotine dependence: Secondary | ICD-10-CM | POA: Insufficient documentation

## 2023-10-20 DIAGNOSIS — K6389 Other specified diseases of intestine: Secondary | ICD-10-CM | POA: Diagnosis present

## 2023-10-20 DIAGNOSIS — N201 Calculus of ureter: Secondary | ICD-10-CM | POA: Diagnosis not present

## 2023-10-20 DIAGNOSIS — E871 Hypo-osmolality and hyponatremia: Secondary | ICD-10-CM

## 2023-10-20 DIAGNOSIS — N132 Hydronephrosis with renal and ureteral calculous obstruction: Principal | ICD-10-CM | POA: Diagnosis present

## 2023-10-20 DIAGNOSIS — N178 Other acute kidney failure: Secondary | ICD-10-CM | POA: Diagnosis not present

## 2023-10-20 DIAGNOSIS — Z743 Need for continuous supervision: Secondary | ICD-10-CM | POA: Diagnosis not present

## 2023-10-20 DIAGNOSIS — N179 Acute kidney failure, unspecified: Secondary | ICD-10-CM | POA: Diagnosis not present

## 2023-10-20 DIAGNOSIS — E1169 Type 2 diabetes mellitus with other specified complication: Secondary | ICD-10-CM

## 2023-10-20 DIAGNOSIS — N138 Other obstructive and reflux uropathy: Secondary | ICD-10-CM | POA: Diagnosis not present

## 2023-10-20 DIAGNOSIS — N134 Hydroureter: Secondary | ICD-10-CM | POA: Diagnosis not present

## 2023-10-20 DIAGNOSIS — G4733 Obstructive sleep apnea (adult) (pediatric): Secondary | ICD-10-CM | POA: Diagnosis not present

## 2023-10-20 DIAGNOSIS — R531 Weakness: Secondary | ICD-10-CM | POA: Diagnosis not present

## 2023-10-20 DIAGNOSIS — E119 Type 2 diabetes mellitus without complications: Secondary | ICD-10-CM | POA: Insufficient documentation

## 2023-10-20 DIAGNOSIS — R7989 Other specified abnormal findings of blood chemistry: Secondary | ICD-10-CM | POA: Diagnosis present

## 2023-10-20 DIAGNOSIS — N135 Crossing vessel and stricture of ureter without hydronephrosis: Secondary | ICD-10-CM | POA: Diagnosis present

## 2023-10-20 DIAGNOSIS — N200 Calculus of kidney: Secondary | ICD-10-CM | POA: Diagnosis not present

## 2023-10-20 DIAGNOSIS — R778 Other specified abnormalities of plasma proteins: Secondary | ICD-10-CM | POA: Diagnosis not present

## 2023-10-20 DIAGNOSIS — I959 Hypotension, unspecified: Secondary | ICD-10-CM | POA: Diagnosis not present

## 2023-10-20 HISTORY — PX: CYSTOSCOPY W/ URETERAL STENT PLACEMENT: SHX1429

## 2023-10-20 HISTORY — DX: Hydronephrosis with renal and ureteral calculous obstruction: N13.2

## 2023-10-20 LAB — CBC WITH DIFFERENTIAL/PLATELET
Abs Immature Granulocytes: 0.12 10*3/uL — ABNORMAL HIGH (ref 0.00–0.07)
Basophils Absolute: 0 10*3/uL (ref 0.0–0.1)
Basophils Relative: 0 %
Eosinophils Absolute: 0 10*3/uL (ref 0.0–0.5)
Eosinophils Relative: 0 %
HCT: 30.8 % — ABNORMAL LOW (ref 36.0–46.0)
Hemoglobin: 10.3 g/dL — ABNORMAL LOW (ref 12.0–15.0)
Immature Granulocytes: 1 %
Lymphocytes Relative: 6 %
Lymphs Abs: 0.7 10*3/uL (ref 0.7–4.0)
MCH: 26.1 pg (ref 26.0–34.0)
MCHC: 33.4 g/dL (ref 30.0–36.0)
MCV: 78.2 fL — ABNORMAL LOW (ref 80.0–100.0)
Monocytes Absolute: 0.4 10*3/uL (ref 0.1–1.0)
Monocytes Relative: 3 %
Neutro Abs: 10.3 10*3/uL — ABNORMAL HIGH (ref 1.7–7.7)
Neutrophils Relative %: 90 %
Platelets: 179 10*3/uL (ref 150–400)
RBC: 3.94 MIL/uL (ref 3.87–5.11)
RDW: 15.5 % (ref 11.5–15.5)
WBC: 11.5 10*3/uL — ABNORMAL HIGH (ref 4.0–10.5)
nRBC: 0 % (ref 0.0–0.2)

## 2023-10-20 LAB — COMPREHENSIVE METABOLIC PANEL WITH GFR
ALT: 29 U/L (ref 0–44)
AST: 17 U/L (ref 15–41)
Albumin: 2.2 g/dL — ABNORMAL LOW (ref 3.5–5.0)
Alkaline Phosphatase: 176 U/L — ABNORMAL HIGH (ref 38–126)
Anion gap: 14 (ref 5–15)
BUN: 53 mg/dL — ABNORMAL HIGH (ref 8–23)
CO2: 20 mmol/L — ABNORMAL LOW (ref 22–32)
Calcium: 8.8 mg/dL — ABNORMAL LOW (ref 8.9–10.3)
Chloride: 94 mmol/L — ABNORMAL LOW (ref 98–111)
Creatinine, Ser: 2.54 mg/dL — ABNORMAL HIGH (ref 0.44–1.00)
GFR, Estimated: 20 mL/min — ABNORMAL LOW (ref >60)
Glucose, Bld: 260 mg/dL — ABNORMAL HIGH (ref 70–99)
Potassium: 3.2 mmol/L — ABNORMAL LOW (ref 3.5–5.1)
Sodium: 128 mmol/L — ABNORMAL LOW (ref 135–145)
Total Bilirubin: 0.7 mg/dL (ref 0.0–1.2)
Total Protein: 6.1 g/dL — ABNORMAL LOW (ref 6.5–8.1)

## 2023-10-20 LAB — GLUCOSE, CAPILLARY
Glucose-Capillary: 160 mg/dL — ABNORMAL HIGH (ref 70–99)
Glucose-Capillary: 271 mg/dL — ABNORMAL HIGH (ref 70–99)

## 2023-10-20 LAB — MAGNESIUM: Magnesium: 1.7 mg/dL (ref 1.7–2.4)

## 2023-10-20 LAB — TROPONIN I (HIGH SENSITIVITY): Troponin I (High Sensitivity): 71 ng/L — ABNORMAL HIGH (ref <18)

## 2023-10-20 LAB — LACTIC ACID, PLASMA: Lactic Acid, Venous: 1.5 mmol/L (ref 0.5–1.9)

## 2023-10-20 SURGERY — CYSTOSCOPY, WITH RETROGRADE PYELOGRAM AND URETERAL STENT INSERTION
Anesthesia: General | Site: Ureter | Laterality: Bilateral

## 2023-10-20 MED ORDER — EPHEDRINE 5 MG/ML INJ
INTRAVENOUS | Status: AC
  Filled 2023-10-20: qty 5

## 2023-10-20 MED ORDER — PROPOFOL 10 MG/ML IV BOLUS
INTRAVENOUS | Status: AC
  Filled 2023-10-20: qty 20

## 2023-10-20 MED ORDER — ROCURONIUM BROMIDE 10 MG/ML (PF) SYRINGE
PREFILLED_SYRINGE | INTRAVENOUS | Status: AC
  Filled 2023-10-20: qty 10

## 2023-10-20 MED ORDER — ROSUVASTATIN CALCIUM 10 MG PO TABS
10.0000 mg | ORAL_TABLET | Freq: Every day | ORAL | Status: DC
  Administered 2023-10-20: 10 mg via ORAL
  Filled 2023-10-20: qty 1

## 2023-10-20 MED ORDER — FENTANYL CITRATE (PF) 100 MCG/2ML IJ SOLN
INTRAMUSCULAR | Status: AC
  Filled 2023-10-20: qty 2

## 2023-10-20 MED ORDER — PHENYLEPHRINE 80 MCG/ML (10ML) SYRINGE FOR IV PUSH (FOR BLOOD PRESSURE SUPPORT)
PREFILLED_SYRINGE | INTRAVENOUS | Status: DC | PRN
  Administered 2023-10-20: 120 ug via INTRAVENOUS

## 2023-10-20 MED ORDER — HYDROMORPHONE HCL 1 MG/ML IJ SOLN: 0.5000 mg | INTRAMUSCULAR | Status: DC | PRN

## 2023-10-20 MED ORDER — PROPOFOL 10 MG/ML IV BOLUS
INTRAVENOUS | Status: DC | PRN
  Administered 2023-10-20: 150 mg via INTRAVENOUS

## 2023-10-20 MED ORDER — INSULIN ASPART 100 UNIT/ML IJ SOLN
0.0000 [IU] | Freq: Three times a day (TID) | INTRAMUSCULAR | Status: DC
  Administered 2023-10-20: 3 [IU] via SUBCUTANEOUS
  Administered 2023-10-21: 5 [IU] via SUBCUTANEOUS
  Administered 2023-10-21: 3 [IU] via SUBCUTANEOUS

## 2023-10-20 MED ORDER — SODIUM CHLORIDE 0.9 % IR SOLN
Status: DC | PRN
  Administered 2023-10-20: 3000 mL via INTRAVESICAL

## 2023-10-20 MED ORDER — ONDANSETRON HCL 4 MG/2ML IJ SOLN: 4.0000 mg | Freq: Once | INTRAMUSCULAR | Status: DC

## 2023-10-20 MED ORDER — DEXAMETHASONE SODIUM PHOSPHATE 10 MG/ML IJ SOLN
INTRAMUSCULAR | Status: AC
  Filled 2023-10-20: qty 1

## 2023-10-20 MED ORDER — IOHEXOL 300 MG/ML  SOLN
INTRAMUSCULAR | Status: DC | PRN
  Administered 2023-10-20: 23 mL

## 2023-10-20 MED ORDER — ONDANSETRON HCL 4 MG/2ML IJ SOLN
INTRAMUSCULAR | Status: AC
  Filled 2023-10-20: qty 2

## 2023-10-20 MED ORDER — SERTRALINE HCL 100 MG PO TABS
100.0000 mg | ORAL_TABLET | Freq: Every day | ORAL | Status: DC
  Administered 2023-10-21: 100 mg via ORAL
  Filled 2023-10-20: qty 1

## 2023-10-20 MED ORDER — FENTANYL CITRATE (PF) 100 MCG/2ML IJ SOLN
INTRAMUSCULAR | Status: DC | PRN
  Administered 2023-10-20: 50 ug via INTRAVENOUS
  Administered 2023-10-20 (×2): 25 ug via INTRAVENOUS

## 2023-10-20 MED ORDER — SODIUM CHLORIDE 0.9 % IV SOLN: INTRAVENOUS | Status: DC

## 2023-10-20 MED ORDER — INSULIN ASPART 100 UNIT/ML IJ SOLN
0.0000 [IU] | Freq: Every day | INTRAMUSCULAR | Status: DC
  Administered 2023-10-20: 3 [IU] via SUBCUTANEOUS

## 2023-10-20 MED ORDER — LIDOCAINE HCL (PF) 2 % IJ SOLN
INTRAMUSCULAR | Status: DC | PRN
  Administered 2023-10-20: 100 mg via INTRADERMAL

## 2023-10-20 MED ORDER — ACETAMINOPHEN 500 MG PO TABS
1000.0000 mg | ORAL_TABLET | Freq: Three times a day (TID) | ORAL | Status: DC
  Administered 2023-10-20 – 2023-10-21 (×2): 1000 mg via ORAL
  Filled 2023-10-20 (×2): qty 2

## 2023-10-20 MED ORDER — INSULIN ASPART 100 UNIT/ML IJ SOLN
3.0000 [IU] | Freq: Three times a day (TID) | INTRAMUSCULAR | Status: DC
  Administered 2023-10-20 – 2023-10-21 (×3): 3 [IU] via SUBCUTANEOUS

## 2023-10-20 MED ORDER — PHENYLEPHRINE 80 MCG/ML (10ML) SYRINGE FOR IV PUSH (FOR BLOOD PRESSURE SUPPORT)
PREFILLED_SYRINGE | INTRAVENOUS | Status: AC
  Filled 2023-10-20: qty 10

## 2023-10-20 MED ORDER — OXYCODONE HCL 5 MG/5ML PO SOLN: 5.0000 mg | Freq: Once | ORAL | Status: DC | PRN

## 2023-10-20 MED ORDER — OXYCODONE HCL 5 MG PO TABS: 5.0000 mg | ORAL_TABLET | Freq: Once | ORAL | Status: DC | PRN

## 2023-10-20 MED ORDER — SODIUM CHLORIDE 0.9 % IV BOLUS
1000.0000 mL | Freq: Once | INTRAVENOUS | Status: AC
  Administered 2023-10-20: 1000 mL via INTRAVENOUS

## 2023-10-20 MED ORDER — EPHEDRINE SULFATE-NACL 50-0.9 MG/10ML-% IV SOSY
PREFILLED_SYRINGE | INTRAVENOUS | Status: DC | PRN
  Administered 2023-10-20: 5 mg via INTRAVENOUS

## 2023-10-20 MED ORDER — DEXAMETHASONE SODIUM PHOSPHATE 10 MG/ML IJ SOLN
INTRAMUSCULAR | Status: DC | PRN
  Administered 2023-10-20: 5 mg via INTRAVENOUS

## 2023-10-20 MED ORDER — SENNOSIDES-DOCUSATE SODIUM 8.6-50 MG PO TABS
1.0000 | ORAL_TABLET | Freq: Two times a day (BID) | ORAL | Status: DC
  Administered 2023-10-20 – 2023-10-21 (×2): 1 via ORAL
  Filled 2023-10-20 (×2): qty 1

## 2023-10-20 MED ORDER — POTASSIUM CHLORIDE CRYS ER 20 MEQ PO TBCR
40.0000 meq | EXTENDED_RELEASE_TABLET | Freq: Once | ORAL | Status: AC
  Administered 2023-10-20: 40 meq via ORAL
  Filled 2023-10-20: qty 2

## 2023-10-20 MED ORDER — FENTANYL CITRATE PF 50 MCG/ML IJ SOSY: 25.0000 ug | PREFILLED_SYRINGE | INTRAMUSCULAR | Status: DC | PRN

## 2023-10-20 MED ORDER — OXYCODONE HCL 5 MG PO TABS: 5.0000 mg | ORAL_TABLET | ORAL | Status: DC | PRN

## 2023-10-20 MED ORDER — MORPHINE SULFATE (PF) 4 MG/ML IV SOLN
2.0000 mg | Freq: Once | INTRAVENOUS | Status: AC
  Administered 2023-10-20: 2 mg via INTRAVENOUS
  Filled 2023-10-20: qty 1

## 2023-10-20 MED ORDER — LACTATED RINGERS IV SOLN: INTRAVENOUS | Status: DC | PRN

## 2023-10-20 MED ORDER — LORAZEPAM 0.5 MG PO TABS: 0.2500 mg | ORAL_TABLET | Freq: Two times a day (BID) | ORAL | Status: DC | PRN

## 2023-10-20 MED ORDER — ONDANSETRON HCL 4 MG/2ML IJ SOLN
INTRAMUSCULAR | Status: DC | PRN
Start: 2023-10-20 — End: 2023-10-20
  Administered 2023-10-20: 4 mg via INTRAVENOUS

## 2023-10-20 MED ORDER — AMLODIPINE BESYLATE 10 MG PO TABS
5.0000 mg | ORAL_TABLET | Freq: Every day | ORAL | Status: DC
  Administered 2023-10-20: 5 mg via ORAL
  Filled 2023-10-20: qty 1

## 2023-10-20 MED ORDER — SODIUM CHLORIDE 0.9 % IV SOLN
1.0000 g | INTRAVENOUS | Status: DC
  Administered 2023-10-20 – 2023-10-21 (×2): 1 g via INTRAVENOUS
  Filled 2023-10-20 (×2): qty 10

## 2023-10-20 MED ORDER — LIDOCAINE HCL (PF) 2 % IJ SOLN
INTRAMUSCULAR | Status: AC
  Filled 2023-10-20: qty 5

## 2023-10-20 SURGICAL SUPPLY — 14 items
BAG URO CATCHER STRL LF (MISCELLANEOUS) ×1 IMPLANT
BASKET ZERO TIP NITINOL 2.4FR (BASKET) IMPLANT
CATH URETL OPEN END 6FR 70 (CATHETERS) IMPLANT
CLOTH BEACON ORANGE TIMEOUT ST (SAFETY) ×1 IMPLANT
GLOVE SURG LX STRL 7.5 STRW (GLOVE) ×1 IMPLANT
GOWN STRL REUS W/ TWL XL LVL3 (GOWN DISPOSABLE) ×1 IMPLANT
GUIDEWIRE ANG ZIPWIRE 038X150 (WIRE) ×1 IMPLANT
GUIDEWIRE STR DUAL SENSOR (WIRE) IMPLANT
KIT TURNOVER KIT A (KITS) ×1 IMPLANT
MANIFOLD NEPTUNE II (INSTRUMENTS) ×1 IMPLANT
PACK CYSTO (CUSTOM PROCEDURE TRAY) ×1 IMPLANT
STENT URET 6FRX26 CONTOUR (STENTS) IMPLANT
TUBE PU 8FR 16IN ENFIT (TUBING) IMPLANT
TUBING CONNECTING 10 (TUBING) ×1 IMPLANT

## 2023-10-20 NOTE — H&P (Signed)
 Adrienne Davidson is an 68 y.o. female.    Chief Complaint:  Recurrent Urolithiasis / Steinstrasse, Acute Renal Failure  HPI:   1 - Recurrent Urolithiasis / Steinstrasse - s/p Left PCNL x2 2025 by Sherrilee and Rt SWL 09/30/23 by McKenzie with massive Rt ureteral steinstrasse from >2cm volume Rt ureteral stone and R>L Renal sotnes by ER CT at [facility] 10/20/23.  2- - Acute Renal Failure - Cr <1 at baseline. Cr 2.5 by ER labs 6/29. Poor PO intake x several days. High grade Rt ureteral obstruction as per above.  PMH sig for IDDM2 (A1c6-7), appy. NO ischemic CV disease /blood thinners. LIves independently with husband who has some are needs. Daughter lives close. Her PCP is Rosina Fielding with [facility].  Today Adrienne Davidson is seen for acute renal failure and massive steinstrasse after SWL by Dr. Sherrilee few weeks ago. NO fevers. Normal Lactate. TAR88.4x   Past Medical History:  Diagnosis Date   Allergic rhinitis    Allergy    Anxiety    Arthritis    Diabetes mellitus (HCC)    Hyperlipidemia    Hypertension    Psoriasis    Bilateral feet   Sleep apnea    Sleep apnea    Vitamin D  deficiency     Past Surgical History:  Procedure Laterality Date   APPENDECTOMY     CESAREAN SECTION  1980   CESAREAN SECTION  1984   EXTRACORPOREAL SHOCK WAVE LITHOTRIPSY Right 10/08/2023   Procedure: LITHOTRIPSY, ESWL;  Surgeon: Sherrilee Belvie CROME, MD;  Location: [facility];  Service: Urology;  Laterality: Right;   IR URETERAL STENT RIGHT NEW ACCESS W/O SEP NEPHROSTOMY CATH  05/22/2023   NEPHROLITHOTOMY Left 05/23/2023   Procedure: NEPHROLITHOTOMY PERCUTANEOUS;  Surgeon: Sherrilee Belvie CROME, MD;  Location: [facility];  Service: Urology;  Laterality: Left;   NEPHROLITHOTOMY Left 06/24/2023   Procedure: NEPHROLITHOTOMY PERCUTANEOUS- second look;  Surgeon: Sherrilee Belvie CROME, MD;  Location: [facility];  Service: Urology;  Laterality: Left;   TONSILLECTOMY      Family History  Problem Relation Age of Onset    Non-Hodgkin's lymphoma Mother    Arthritis Mother    Cancer Mother    Heart disease Father    Arthritis Father    COPD Father    Hypertension Father    Diabetes Daughter    Rheum arthritis Daughter    Polycystic ovary syndrome Daughter    Breast cancer Neg Hx    Social History:  reports that she quit smoking about 5 years ago. Her smoking use included cigarettes. She started smoking about 30 years ago. She has a 6.3 pack-year smoking history. She has never used smokeless tobacco. She reports that she does not currently use alcohol. She reports that she does not currently use drugs.  Allergies:  Allergies  Allergen Reactions   Enalapril Anaphylaxis    Angioedema Aug 2018    (Not in a hospital admission)   Results for orders placed or performed during the hospital encounter of 10/20/23 (from the past 48 hours)  CBC with Differential     Status: Abnormal   Collection Time: 10/20/23 11:10 AM  Result Value Ref Range   WBC 11.5 (H) 4.0 - 10.5 K/uL   RBC 3.94 3.87 - 5.11 MIL/uL   Hemoglobin 10.3 (L) 12.0 - 15.0 g/dL   HCT 69.1 (L) 63.9 - 53.9 %   MCV 78.2 (L) 80.0 - 100.0 fL   MCH 26.1 26.0 - 34.0 pg  MCHC 33.4 30.0 - 36.0 g/dL   RDW 84.4 88.4 - 84.4 %   Platelets 179 150 - 400 K/uL   nRBC 0.0 0.0 - 0.2 %   Neutrophils Relative % 90 %   Neutro Abs 10.3 (H) 1.7 - 7.7 K/uL   Lymphocytes Relative 6 %   Lymphs Abs 0.7 0.7 - 4.0 K/uL   Monocytes Relative 3 %   Monocytes Absolute 0.4 0.1 - 1.0 K/uL   Eosinophils Relative 0 %   Eosinophils Absolute 0.0 0.0 - 0.5 K/uL   Basophils Relative 0 %   Basophils Absolute 0.0 0.0 - 0.1 K/uL   Immature Granulocytes 1 %   Abs Immature Granulocytes 0.12 (H) 0.00 - 0.07 K/uL    Comment: Performed at [facility], [address] [place], [place] [address]  Comprehensive metabolic panel     Status: Abnormal   Collection Time: 10/20/23 11:10 AM  Result Value Ref Range   Sodium 128 (L) 135 - 145 mmol/L   Potassium 3.2 (L) 3.5 - 5.1 mmol/L    Chloride 94 (L) 98 - 111 mmol/L   CO2 20 (L) 22 - 32 mmol/L   Glucose, Bld 260 (H) 70 - 99 mg/dL    Comment: Glucose reference range applies only to samples taken after fasting for at least 8 hours.   BUN 53 (H) 8 - 23 mg/dL   Creatinine, Ser 7.45 (H) 0.44 - 1.00 mg/dL   Calcium  8.8 (L) 8.9 - 10.3 mg/dL   Total Protein 6.1 (L) 6.5 - 8.1 g/dL   Albumin 2.2 (L) 3.5 - 5.0 g/dL   AST 17 15 - 41 U/L   ALT 29 0 - 44 U/L   Alkaline Phosphatase 176 (H) 38 - 126 U/L   Total Bilirubin 0.7 0.0 - 1.2 mg/dL   GFR, Estimated 20 (L) >60 mL/min    Comment: (NOTE) Calculated using the CKD-EPI Creatinine Equation (2021)    Anion gap 14 5 - 15    Comment: Performed at [facility], [address] [place], [place] [address]  Troponin I (High Sensitivity)     Status: Abnormal   Collection Time: 10/20/23 11:10 AM  Result Value Ref Range   Troponin I (High Sensitivity) 71 (H) <18 ng/L    Comment: (NOTE) Elevated high sensitivity troponin I (hsTnI) values and significant  changes across serial measurements may suggest ACS but many other  chronic and acute conditions are known to elevate hsTnI results.  Refer to the Links section for chest pain algorithms and additional  guidance. Performed at [facility], [address], [place], [place] [address]   Lactic acid, plasma     Status: None   Collection Time: 10/20/23 11:13 AM  Result Value Ref Range   Lactic Acid, Venous 1.5 0.5 - 1.9 mmol/L    Comment: Performed at [facility], [address] [place], [place] [address]  Magnesium     Status: None   Collection Time: 10/20/23 12:19 PM  Result Value Ref Range   Magnesium 1.7 1.7 - 2.4 mg/dL    Comment: Performed at [facility], [address] [place], [place] [address]   CT Renal [place] Study Result Date: 10/20/2023 CLINICAL DATA:  68 year old female with abdomen and flank pain. History of staghorn calculi, status post recent lithotripsy. Previous left side nephrostomy. EXAM: CT ABDOMEN AND PELVIS  WITHOUT CONTRAST TECHNIQUE: Multidetector CT imaging of the abdomen and pelvis was performed following the standard protocol without IV contrast. RADIATION DOSE REDUCTION: This exam  was performed according to the departmental dose-optimization program which includes automated exposure control, adjustment of the mA and/or kV according to patient size and/or use of iterative reconstruction technique. COMPARISON:  CT Abdomen and Pelvis 05/28/2023 and earlier. FINDINGS: Lower chest: Streaky and platelike opacity now at both lung bases, more resembles atelectasis than infection. No pleural effusion. Calcified coronary artery atherosclerosis. Calcified aortic atherosclerosis. No pericardial effusion or cardiomegaly. Hepatobiliary: Stable and negative noncontrast appearance. Pancreas: Negative. Spleen: Negative. Adrenals/Urinary Tract: Adrenal glands are stable and within normal limits. Left nephrostomy and ureteral stent have been removed since February. Regressed but not resolved left nephrolithiasis since that time, residual calcifications in the left kidney up to 8 mm. No left hydronephrosis or hydroureter. Contralateral right nephromegaly, moderate to severe right hydronephrosis and right hydroureter. Regression of the previous staghorn calculus, but right lower pole confluent calcifications encompass up to 11 mm. Dilated and tortuous right ureter to the level of clustered calcifications at the pelvic inlet which are numerous. Confluent, up to 15 mm collection of calcifications as seen on coronal image 58. Distal to that additional extensive and confluent distal right ureter calcifications best seen on coronal image 63, more than 2 cm in length and about 6-7 mm diameter (series 2, image 66. No calculi within the bladder which seems to remain normal. Stomach/Bowel: Redundant sigmoid colon with diverticulosis. No dilated large or small bowel. Decompressed right colon. Cecum on a lax mesentery. Evidence of prior  appendectomy. However, chronic calcification of a lower abdominal small bowel loop which today is on coronal image 42, previously just to the right of midline, and appearance of that loop unchanged from December. No bowel enlargement or regional inflammation there. But furthermore, faint nodular calcification along the noncontrast SMA on series 2, image 55 stable. No pneumoperitoneum. No free fluid. No peritoneal space inflammation is identified. Vascular/Lymphatic: Chronic severe Aortoiliac calcified atherosclerosis. Vascular patency is not evaluated in the absence of IV contrast. Reproductive: Stable and negative noncontrast appearance. Other: No pelvis free fluid. Musculoskeletal: Advanced chronic lumbar spine degeneration is stable. No acute osseous abnormality identified. IMPRESSION: 1. Positive for Moderate to Severe Obstructive Uropathy on the Right due to severe Steinstrasse in the right ureter at and below the pelvic inlet. 2. 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 Recommend follow-up outpatient CT Enterography to evaluate: Abnormal calcification of a distal small bowel loop in the lower abdomen (coronal image 42) and associated suspicious nodular calcification in the nearby small bowel mesentery (coronal image 29). 3. Regressed bilateral staghorn calculi since February but bulky intrarenal calcifications persist. Electronically Signed   By: VEAR Hurst M.D.   On: 10/20/2023 12:08    Review of Systems  Constitutional:  Positive for fatigue. Negative for chills and fever.  Genitourinary:  Positive for flank pain.  All other systems reviewed and are negative.   Blood pressure 114/63, pulse 65, temperature 98 F (36.7 C), temperature source Oral, resp. rate (!) 24, height 5' 2 (1.575 m), weight 72.5 kg, SpO2 93%. Physical Exam Vitals reviewed.  Constitutional:      Comments: Pleasant with daughter at bedside.   HENT:      Head: Normocephalic.   Eyes:     Pupils: Pupils are equal, round, and reactive to light.    Cardiovascular:     Rate and Rhythm: Normal rate.  Pulmonary:     Effort: Pulmonary effort is normal.  Abdominal:     General: Abdomen is flat.  Genitourinary:    Comments: Minimal Rt CVAT at present.   Musculoskeletal:        General: Normal range of motion.     Cervical back: Normal range of motion.   Neurological:     General: No focal deficit present.     Mental Status: She is alert.   Psychiatric:        Mood and Affect: Mood normal.      Assessment/Plan  Rec renal decompression with bilateral stents, then admission for hydraiton, serial labs. Will need eventual unilateral v. Bilaeral ureteroscopy with Dr. Sherrilee penidng her goals once GFR recovered in elective setting.   Risks, benefits, alternatives, expected peri-op course discussed. She voided understanding and desire to proceed.   Ricardo KATHEE Alvaro Mickey., MD 10/20/2023, 12:42 PM

## 2023-10-20 NOTE — ED Provider Notes (Signed)
 [facility] EMERGENCY DEPARTMENT AT [facility] Provider Note   CSN: [id number] Arrival date & time: 10/20/23  1038     Patient presents with: Abnormal Lab   Adrienne Davidson is a 68 y.o. female.   Patient is a 68 year old female who presents emergency department for abnormal labs patient notes that she had lithotripsy performed on 6/17.  Patient notes that she has had poor p.o. intake, worsening generalized weakness, nausea, vomiting, diarrhea since that time.  She was evaluated in urgent care yesterday and was noted to have low sodium and elevated creatinine.  Patient notes that she is not having any active pain at this time to include abdominal pain, chest pain, flank pain.  She does admit to decreased urine output.  She denies any fever or chills.   Abnormal Lab      Prior to Admission medications   Medication Sig Start Date End Date Taking? Authorizing Provider  acetaminophen  (TYLENOL ) 500 MG tablet Take 500-1,000 mg by mouth every 6 (six) hours as needed (pain.).    [provider]  amLODipine  (NORVASC ) 5 MG tablet Take 1 tablet (5 mg total) by mouth daily. Patient taking differently: Take 5 mg by mouth every evening. 01/18/23   [name], DO  Cholecalciferol (D-3-5) 125 MCG (5000 UT) capsule Take 5,000 Units by mouth in the morning.    [provider]  Coenzyme Q10 200 MG capsule Take 200 mg by mouth at bedtime.    [provider]  CRANBERRY PO Take 504 mg by mouth at bedtime.    [provider]  Dulaglutide  (TRULICITY ) 3 MG/0.5ML SOPN Inject 3 mg as directed once a week. 01/18/23   [name], DO  EPINEPHrine  0.3 mg/0.3 mL IJ SOAJ injection Inject 0.3 mg into the muscle as needed for anaphylaxis. Then call 911 06/13/22   [name], DO  FARXIGA  5 MG TABS tablet Take 1 tablet (5 mg total) by mouth daily. 10/01/23   Jolinda Norene M, DO  glucose blood test strip Test blood sugar once daily (one touch verio  if covered by ins/ otherwise whatever ins preference is) E11.65 11/25/20   [name], DO  Krill Oil 500 MG CAPS Take 500 mg by mouth in the morning.    [provider]  Lancets JANETT ULTRASOFT) lancets Use once daily as instructed E11.65 (one touch verio) 11/25/20   Jolinda Norene M, DO  LORazepam  (ATIVAN ) 0.5 MG tablet Take 0.5-1 tablets (0.25-0.5 mg total) by mouth 2 (two) times daily as needed for anxiety. 01/18/23   [name], DO  metFORMIN  (GLUCOPHAGE ) 1000 MG tablet Take 1 tablet (1,000 mg total) by mouth 2 (two) times daily with a meal. 01/18/23   [name], DO  Multiple Vitamin (MULTIVITAMIN WITH MINERALS) TABS tablet Take 1 tablet by mouth in the morning.    [provider]  nitrofurantoin , macrocrystal-monohydrate, (MACROBID ) 100 MG capsule Take 100 mg by mouth 2 (two) times daily. 10/19/23   [provider]  ondansetron  (ZOFRAN ) 4 MG tablet Take 1 tablet (4 mg total) by mouth daily as needed for nausea or vomiting. 10/08/23 10/07/24  Sherrilee Belvie CROME, MD  OVER THE COUNTER MEDICATION Take 1 tablet by mouth daily. Nervive supplement    [provider]  oxyCODONE -acetaminophen  (PERCOCET) 5-325 MG tablet Take 1 tablet by mouth every 4 (four) hours as needed. 10/08/23 10/07/24  Sherrilee Belvie CROME, MD  rosuvastatin  (CRESTOR ) 10 MG tablet Take 1 tablet (10 mg  total) by mouth daily. Patient taking differently: Take 10 mg by mouth every evening. 01/18/23   Jolinda Potter M, DO  sertraline  (ZOLOFT ) 100 MG tablet Take 1 tablet (100 mg total) by mouth daily. 01/18/23   Jolinda Potter HERO, DO  tamsulosin  (FLOMAX ) 0.4 MG CAPS capsule Take 1 capsule (0.4 mg total) by mouth daily after supper. 10/08/23   McKenzie, Belvie CROME, MD    Allergies: Enalapril    Review of Systems  Constitutional:  Positive for fatigue.  Neurological:  Positive for weakness.  All other systems reviewed and are negative.   Updated Vital Signs BP 114/63    Pulse 65   Temp 98 F (36.7 C) (Oral)   Resp (!) 24   Ht 5' 2 (1.575 m)   Wt 72.5 kg   SpO2 93%   BMI 29.23 kg/m   Physical Exam Vitals and nursing note reviewed.  Constitutional:      Appearance: Normal appearance.  HENT:     Head: Normocephalic and atraumatic.     Nose: Nose normal.     Mouth/Throat:     Mouth: Mucous membranes are moist.   Eyes:     Extraocular Movements: Extraocular movements intact.     Conjunctiva/sclera: Conjunctivae normal.     Pupils: Pupils are equal, round, and reactive to light.    Cardiovascular:     Rate and Rhythm: Normal rate and regular rhythm.     Pulses: Normal pulses.     Heart sounds: Normal heart sounds. No murmur heard.    No gallop.  Pulmonary:     Effort: Pulmonary effort is normal. No respiratory distress.     Breath sounds: Normal breath sounds. No stridor. No wheezing, rhonchi or rales.  Abdominal:     General: Abdomen is flat. Bowel sounds are normal. There is no distension.     Palpations: Abdomen is soft. There is no mass.     Tenderness: There is no abdominal tenderness. There is no guarding.     Hernia: No hernia is present.   Musculoskeletal:        General: Normal range of motion.     Cervical back: Normal range of motion and neck supple.   Skin:    General: Skin is warm and dry.   Neurological:     General: No focal deficit present.     Mental Status: She is alert and oriented to person, place, and time. Mental status is at baseline.   Psychiatric:        Mood and Affect: Mood normal.        Behavior: Behavior normal.        Thought Content: Thought content normal.        Judgment: Judgment normal.     (all labs ordered are listed, but only abnormal results are displayed) Labs Reviewed  CBC WITH DIFFERENTIAL/PLATELET - Abnormal; Notable for the following components:      Result Value   WBC 11.5 (*)    Hemoglobin 10.3 (*)    HCT 30.8 (*)    MCV 78.2 (*)    Neutro Abs 10.3 (*)    Abs Immature  Granulocytes 0.12 (*)    All other components within normal limits  COMPREHENSIVE METABOLIC PANEL WITH GFR - Abnormal; Notable for the following components:   Sodium 128 (*)    Potassium 3.2 (*)    Chloride 94 (*)    CO2 20 (*)    Glucose, Bld 260 (*)    BUN  53 (*)    Creatinine, Ser 2.54 (*)    Calcium  8.8 (*)    Total Protein 6.1 (*)    Albumin 2.2 (*)    Alkaline Phosphatase 176 (*)    GFR, Estimated 20 (*)    All other components within normal limits  TROPONIN I (HIGH SENSITIVITY) - Abnormal; Notable for the following components:   Troponin I (High Sensitivity) 71 (*)    All other components within normal limits  LACTIC ACID, PLASMA  MAGNESIUM  URINALYSIS, ROUTINE W REFLEX MICROSCOPIC    EKG: None  Radiology: CT Renal Stone Study Result Date: 10/20/2023 CLINICAL DATA:  68 year old female with abdomen and flank pain. History of staghorn calculi, status post recent lithotripsy. Previous left side nephrostomy. EXAM: CT ABDOMEN AND PELVIS WITHOUT CONTRAST TECHNIQUE: Multidetector CT imaging of the abdomen and pelvis was performed following the standard protocol without IV contrast. RADIATION DOSE REDUCTION: This exam was performed according to the departmental dose-optimization program which includes automated exposure control, adjustment of the mA and/or kV according to patient size and/or use of iterative reconstruction technique. COMPARISON:  CT Abdomen and Pelvis 05/28/2023 and earlier. FINDINGS: Lower chest: Streaky and platelike opacity now at both lung bases, more resembles atelectasis than infection. No pleural effusion. Calcified coronary artery atherosclerosis. Calcified aortic atherosclerosis. No pericardial effusion or cardiomegaly. Hepatobiliary: Stable and negative noncontrast appearance. Pancreas: Negative. Spleen: Negative. Adrenals/Urinary Tract: Adrenal glands are stable and within normal limits. Left nephrostomy and ureteral stent have been removed since February.  Regressed but not resolved left nephrolithiasis since that time, residual calcifications in the left kidney up to 8 mm. No left hydronephrosis or hydroureter. Contralateral right nephromegaly, moderate to severe right hydronephrosis and right hydroureter. Regression of the previous staghorn calculus, but right lower pole confluent calcifications encompass up to 11 mm. Dilated and tortuous right ureter to the level of clustered calcifications at the pelvic inlet which are numerous. Confluent, up to 15 mm collection of calcifications as seen on coronal image 58. Distal to that additional extensive and confluent distal right ureter calcifications best seen on coronal image 63, more than 2 cm in length and about 6-7 mm diameter (series 2, image 66. No calculi within the bladder which seems to remain normal. Stomach/Bowel: Redundant sigmoid colon with diverticulosis. No dilated large or small bowel. Decompressed right colon. Cecum on a lax mesentery. Evidence of prior appendectomy. However, chronic calcification of a lower abdominal small bowel loop which today is on coronal image 42, previously just to the right of midline, and appearance of that loop unchanged from December. No bowel enlargement or regional inflammation there. But furthermore, faint nodular calcification along the noncontrast SMA on series 2, image 55 stable. No pneumoperitoneum. No free fluid. No peritoneal space inflammation is identified. Vascular/Lymphatic: Chronic severe Aortoiliac calcified atherosclerosis. Vascular patency is not evaluated in the absence of IV contrast. Reproductive: Stable and negative noncontrast appearance. Other: No pelvis free fluid. Musculoskeletal: Advanced chronic lumbar spine degeneration is stable. No acute osseous abnormality identified. IMPRESSION: 1. Positive for Moderate to Severe Obstructive Uropathy on the Right due to severe Steinstrasse in the right ureter at and below the pelvic inlet. 2. Constellation of  incidental small bowel and mesentery finding finding suspicious for Small Bowel Carcinoid Tumor. Recommend follow-up outpatient CT Enterography to evaluate: Abnormal calcification of a distal small bowel loop in the lower abdomen (coronal image 42) and associated suspicious nodular calcification in the nearby small bowel mesentery (coronal image 29). 3. Regressed bilateral staghorn calculi since  February but bulky intrarenal calcifications persist. Electronically Signed   By: VEAR Hurst M.D.   On: 10/20/2023 12:08     Procedures   Medications Ordered in the ED  ondansetron  (ZOFRAN ) injection 4 mg (0 mg Intravenous Hold 10/20/23 1122)  sodium chloride  0.9 % bolus 1,000 mL (0 mLs Intravenous Stopped 10/20/23 1223)  potassium chloride SA (KLOR-CON M) CR tablet 40 mEq (40 mEq Oral Given 10/20/23 1222)                                    Medical Decision Making Amount and/or Complexity of Data Reviewed Labs: ordered. Radiology: ordered.  Risk Prescription drug management.   This patient presents to the ED for concern of weakness, this involves an extensive number of treatment options, and is a complaint that carries with it a high risk of complications and morbidity.  The differential diagnosis includes acute renal failure, ureteral stone, electrolyte derangement, ACS, dehydration   Co morbidities that complicate the patient evaluation  Recent lithotripsy   Additional history obtained:  Additional history obtained from family External records from outside source obtained and reviewed including medical records   Lab Tests:  I Ordered, and personally interpreted labs.  The pertinent results include: Mild leukocytosis, mild anemia, hyponatremia, hypokalemia, elevated creatinine, normal liver function, elevated troponin, normal lactic acid   Imaging Studies ordered:  I ordered imaging studies including CT scan of the abdomen and pelvis I independently visualized and interpreted imaging  which showed obstructive uropathy on the right side with moderate to severe hydro, calcification within the small bowel I agree with the radiologist interpretation   Cardiac Monitoring: / EKG:  The patient was maintained on a cardiac monitor.  I personally viewed and interpreted the cardiac monitored which showed an underlying rhythm of: Normal sinus rhythm, no ST/T wave changes, no ischemic changes, no STEMI   Consultations Obtained:  I requested consultation with the urology, Dr. Alvaro,  and discussed lab and imaging findings as well as pertinent plan - they recommend: Transfer ER to ER to [facility] List / ED Course / Critical interventions / Medication management  Patient does remain stable at this time.  Discussed with patient that I did discuss patient case with Dr. Alvaro with urology who did recommend emergent transfer to [facility] for ureteral stenting.  Patient does not actively meet sepsis criteria at this point.  We have been unable to obtain a urine in the emergency department.  She does have an elevated troponin but do suspect that this is type II secondary to her acute renal failure.  Patient does have a mild leukocytosis at this point but feel like this is secondary to her lack of p.o. intake and dehydration.  Have discussed patient case with Dr. Mannie in the emergency department at [facility] who has accepted for transfer at this time. I ordered medication including IV fluids, Rocephin , potassium, morphine for weakness Reevaluation of the patient after these medicines showed that the patient improved I have reviewed the patients home medicines and have made adjustments as needed   Social Determinants of Health:  None   Test / Admission - Considered:  Admission       Final diagnoses:  None    ED Discharge Orders     None          Daralene Lonni BIRCH, PA-C 10/20/23 1405    Zammit,  Fairy, MD 10/21/23 [phone]

## 2023-10-20 NOTE — Anesthesia Postprocedure Evaluation (Signed)
 Anesthesia Post Note  Patient: Adrienne Davidson  Procedure(s) Performed: CYSTOSCOPY, WITH RETROGRADE PYELOGRAM AND URETERAL STENT INSERTION (Bilateral: Ureter)     Patient location during evaluation: PACU Anesthesia Type: General Level of consciousness: awake Pain management: pain level controlled Vital Signs Assessment: post-procedure vital signs reviewed and stable Respiratory status: spontaneous breathing, nonlabored ventilation and respiratory function stable Cardiovascular status: blood pressure returned to baseline and stable Postop Assessment: no apparent nausea or vomiting Anesthetic complications: no   No notable events documented.  Last Vitals:  Vitals:   10/20/23 1653 10/20/23 1702  BP: (!) 130/58 124/69  Pulse:  80  Resp: (!) 21 (!) 23  Temp: 37 C   SpO2: 100% 96%    Last Pain:  Vitals:   10/20/23 1653  TempSrc:   PainSc: 0-No pain                 Delon Aisha Arch

## 2023-10-20 NOTE — Anesthesia Preprocedure Evaluation (Addendum)
 Anesthesia Evaluation  Patient identified by MRN, date of birth, ID band Patient awake    Reviewed: Allergy & Precautions, NPO status , Patient's Chart, lab work & pertinent test results  History of Anesthesia Complications Negative for: history of anesthetic complications  Airway Mallampati: III  TM Distance: >3 FB Neck ROM: Full   Comment: Previous grade I view with Glidescope 3, easy mask  Left side of the mouth opens more than the right side. Dental  (+) Dental Advisory Given,    Pulmonary neg shortness of breath, sleep apnea and Continuous Positive Airway Pressure Ventilation , neg COPD, neg recent URI, former smoker   Pulmonary exam normal breath sounds clear to auscultation       Cardiovascular hypertension (amlodipine ), Pt. on medications (-) angina (-) Past MI, (-) Cardiac Stents and (-) CABG (-) dysrhythmias  Rhythm:Regular Rate:Normal  HLD   Neuro/Psych neg Seizures PSYCHIATRIC DISORDERS Anxiety        GI/Hepatic negative GI ROS, Neg liver ROS,,,  Endo/Other  diabetes, Type 2, Oral Hypoglycemic Agents    Renal/GU ARFRenal disease (bilateral stones)     Musculoskeletal  (+) Arthritis ,    Abdominal   Peds  Hematology  (+) Blood dyscrasia, anemia Lab Results      Component                Value               Date                      WBC                      11.5 (H)            10/20/2023                HGB                      10.3 (L)            10/20/2023                HCT                      30.8 (L)            10/20/2023                MCV                      78.2 (L)            10/20/2023                PLT                      179                 10/20/2023              Anesthesia Other Findings Na 128  Last Trulicity : 2 weeks ago  Last Farxiga : this morning  Reproductive/Obstetrics                             Anesthesia Physical Anesthesia Plan  ASA:  3  Anesthesia Plan: General   Post-op Pain Management:    Induction: Intravenous  PONV Risk Score  and Plan: 3 and Ondansetron , Dexamethasone  and Treatment may vary due to age or medical condition  Airway Management Planned: LMA  Additional Equipment:   Intra-op Plan:   Post-operative Plan: Extubation in OR  Informed Consent: I have reviewed the patients History and Physical, chart, labs and discussed the procedure including the risks, benefits and alternatives for the proposed anesthesia with the patient or authorized representative who has indicated his/her understanding and acceptance.     Dental advisory given  Plan Discussed with: CRNA and Anesthesiologist  Anesthesia Plan Comments: (Risks of general anesthesia discussed including, but not limited to, sore throat, hoarse voice, chipped/damaged teeth, injury to vocal cords, nausea and vomiting, allergic reactions, lung infection, heart attack, stroke, and death. All questions answered. )       Anesthesia Quick Evaluation

## 2023-10-20 NOTE — ED Notes (Signed)
 [facility] at bedside to transport pt to [facility] ED

## 2023-10-20 NOTE — Brief Op Note (Signed)
 10/20/2023  4:46 PM  PATIENT:  Adrien ONEIDA Lamer  68 y.o. female  PRE-OPERATIVE DIAGNOSIS:  BILATERAL URETERAL STONES WITH ACUTE RENAL FAILURE  POST-OPERATIVE DIAGNOSIS:  BILATERAL URETERAL STONES WITH ACUTE RENAL FAILURE  PROCEDURE:  Procedure(s): CYSTOSCOPY, WITH RETROGRADE PYELOGRAM AND URETERAL STENT INSERTION (Bilateral)  SURGEON:  Surgeons and Role:    * Manny, Ricardo KATHEE Raddle., MD - Primary  PHYSICIAN ASSISTANT:   ASSISTANTS: none   ANESTHESIA:   general  EBL:  0 mL   BLOOD ADMINISTERED:none  DRAINS: 25F foley to gravity   LOCAL MEDICATIONS USED:  NONE  SPECIMEN:  No Specimen  DISPOSITION OF SPECIMEN:  N/A  COUNTS:  YES  TOURNIQUET:  * No tourniquets in log *  DICTATION: .Other Dictation: Dictation Number [id number]  PLAN OF CARE: Admit for overnight observation  PATIENT DISPOSITION:  PACU - hemodynamically stable.   Delay start of Pharmacological VTE agent (>24hrs) due to surgical blood loss or risk of bleeding: yes

## 2023-10-20 NOTE — Anesthesia Procedure Notes (Signed)
 Procedure Name: LMA Insertion Date/Time: 10/20/2023 4:21 PM  Performed by: Judythe Tanda Aran, CRNAPre-anesthesia Checklist: Emergency Drugs available, Patient identified, Suction available and Patient being monitored Patient Re-evaluated:Patient Re-evaluated prior to induction Oxygen Delivery Method: Circle system utilized Preoxygenation: Pre-oxygenation with 100% oxygen Induction Type: IV induction Ventilation: Mask ventilation without difficulty LMA: LMA inserted LMA Size: 4.0 Number of attempts: 1 Placement Confirmation: positive ETCO2 and breath sounds checked- equal and bilateral Tube secured with: Tape Dental Injury: Teeth and Oropharynx as per pre-operative assessment

## 2023-10-20 NOTE — ED Triage Notes (Signed)
 Pt reports lithotripsy on 6/9 and not been the same since. Reports she was seen at walk in clinic yesterday and called this morning due to low sodium and kidney failure. Pt denies pain at this time. Endorses n/d. Diagnosed with UTI and started on Macrobid  yesterday.

## 2023-10-20 NOTE — Transfer of Care (Signed)
 Immediate Anesthesia Transfer of Care Note  Patient: Adrienne Davidson  Procedure(s) Performed: CYSTOSCOPY, WITH RETROGRADE PYELOGRAM AND URETERAL STENT INSERTION (Bilateral: Ureter)  Patient Location: PACU  Anesthesia Type:General  Level of Consciousness: awake  Airway & Oxygen Therapy: Patient Spontanous Breathing and Patient connected to face mask  Post-op Assessment: Report given to RN and Post -op Vital signs reviewed and stable  Post vital signs: Reviewed and stable  Last Vitals:  Vitals Value Taken Time  BP    Temp    Pulse    Resp    SpO2      Last Pain:  Vitals:   10/20/23 1603  TempSrc:   PainSc: 0-No pain         Complications: No notable events documented.

## 2023-10-21 ENCOUNTER — Encounter (HOSPITAL_COMMUNITY): Payer: Self-pay | Admitting: Urology

## 2023-10-21 DIAGNOSIS — N179 Acute kidney failure, unspecified: Secondary | ICD-10-CM | POA: Diagnosis not present

## 2023-10-21 DIAGNOSIS — N132 Hydronephrosis with renal and ureteral calculous obstruction: Secondary | ICD-10-CM | POA: Diagnosis not present

## 2023-10-21 LAB — BASIC METABOLIC PANEL WITH GFR
Anion gap: 9 (ref 5–15)
BUN: 45 mg/dL — ABNORMAL HIGH (ref 8–23)
CO2: 19 mmol/L — ABNORMAL LOW (ref 22–32)
Calcium: 8.5 mg/dL — ABNORMAL LOW (ref 8.9–10.3)
Chloride: 104 mmol/L (ref 98–111)
Creatinine, Ser: 1.67 mg/dL — ABNORMAL HIGH (ref 0.44–1.00)
GFR, Estimated: 33 mL/min — ABNORMAL LOW (ref >60)
Glucose, Bld: 239 mg/dL — ABNORMAL HIGH (ref 70–99)
Potassium: 4.1 mmol/L (ref 3.5–5.1)
Sodium: 132 mmol/L — ABNORMAL LOW (ref 135–145)

## 2023-10-21 LAB — GLUCOSE, CAPILLARY
Glucose-Capillary: 176 mg/dL — ABNORMAL HIGH (ref 70–99)
Glucose-Capillary: 212 mg/dL — ABNORMAL HIGH (ref 70–99)

## 2023-10-21 MED ORDER — ENSURE PLUS HIGH PROTEIN PO LIQD
237.0000 mL | Freq: Two times a day (BID) | ORAL | Status: DC
  Administered 2023-10-21: 237 mL via ORAL

## 2023-10-21 NOTE — Discharge Instructions (Signed)
 1 - You may have urinary urgency (bladder spasms) and bloody urine on / off with stent in place. This is normal.  2 - Call MD or go to ER for fever >102, severe pain / nausea / vomiting not relieved by medications, or acute change in medical status

## 2023-10-21 NOTE — Progress Notes (Signed)
 1 Day Post-Op Subjective: No acute events overnight.  Patient feeling better this morning and voiding without difficulty after catheter removal.  She was accompanied by her daughter.  Case and plan was reviewed and all questions answered to their satisfaction.  Objective: Vital signs in last 24 hours: Temp:  [97.5 F (36.4 C)-98.8 F (37.1 C)] 97.5 F (36.4 C) (06/30 0547) Pulse Rate:  [62-80] 63 (06/30 0547) Resp:  [14-24] 14 (06/30 0547) BP: (111-136)/(52-69) 136/60 (06/30 0547) SpO2:  [93 %-100 %] 98 % (06/30 0547)  Assessment/Plan: # Large volume right ureteral Steinstrasse # Bilateral renal stones  S/p right ESWL of large renal stone with Dr. Sherrilee on 10/08/2023. Presented to [facility] emergency department on 10/20/23 for generalized weakness nausea/vomiting.  CT A/P noted severe right side Steinstrasse. S/p bilateral ureteral stent placement Dr. Alvaro on 10/20/2023. Trend labs.  AKI not unexpected at this stage.  Now that her kidney has been decompressed and volume resuscitated, I expect that she will return to baseline within a short time. Discharge home once SCr/GFR improving. Keep outpt follow up with [facility].  Urology will follow.  Please call with questions.  Intake/Output from previous day: 06/29 0701 - 06/30 0700 In: 1940 [P.O.:240; I.V.:600; IV Piggyback:1100] Out: 4650 [Urine:4650]  Intake/Output this shift: Total I/O In: 240 [P.O.:240] Out: 450 [Urine:450]  Physical Exam:  General: Alert and oriented CV: No cyanosis Lungs: equal chest rise Abdomen: Soft, NTND, no rebound or guarding Gu: Foley removed  Lab Results: Recent Labs    10/20/23 1110  HGB 10.3*  HCT 30.8*   BMET Recent Labs    10/20/23 1110 10/21/23 0442  NA 128* 132*  K 3.2* 4.1  CL 94* 104  CO2 20* 19*  GLUCOSE 260* 239*  BUN 53* 45*  CREATININE 2.54* 1.67*  CALCIUM  8.8* 8.5*  HGB 10.3*  --   WBC 11.5*  --      Studies/Results: DG C-Arm 1-60 Min-No  Report Result Date: 10/20/2023 Fluoroscopy was utilized by the requesting physician.  No radiographic interpretation.   CT Renal Stone Study Result Date: 10/20/2023 CLINICAL DATA:  68 year old female with abdomen and flank pain. History of staghorn calculi, status post recent lithotripsy. Previous left side nephrostomy. EXAM: CT ABDOMEN AND PELVIS WITHOUT CONTRAST TECHNIQUE: Multidetector CT imaging of the abdomen and pelvis was performed following the standard protocol without IV contrast. RADIATION DOSE REDUCTION: This exam was performed according to the departmental dose-optimization program which includes automated exposure control, adjustment of the mA and/or kV according to patient size and/or use of iterative reconstruction technique. COMPARISON:  CT Abdomen and Pelvis 05/28/2023 and earlier. FINDINGS: Lower chest: Streaky and platelike opacity now at both lung bases, more resembles atelectasis than infection. No pleural effusion. Calcified coronary artery atherosclerosis. Calcified aortic atherosclerosis. No pericardial effusion or cardiomegaly. Hepatobiliary: Stable and negative noncontrast appearance. Pancreas: Negative. Spleen: Negative. Adrenals/Urinary Tract: Adrenal glands are stable and within normal limits. Left nephrostomy and ureteral stent have been removed since February. Regressed but not resolved left nephrolithiasis since that time, residual calcifications in the left kidney up to 8 mm. No left hydronephrosis or hydroureter. Contralateral right nephromegaly, moderate to severe right hydronephrosis and right hydroureter. Regression of the previous staghorn calculus, but right lower pole confluent calcifications encompass up to 11 mm. Dilated and tortuous right ureter to the level of clustered calcifications at the pelvic inlet which are numerous. Confluent, up to 15 mm collection of calcifications as seen on coronal image 58. Distal to that  additional extensive and confluent distal right  ureter calcifications best seen on coronal image 63, more than 2 cm in length and about 6-7 mm diameter (series 2, image 66. No calculi within the bladder which seems to remain normal. Stomach/Bowel: Redundant sigmoid colon with diverticulosis. No dilated large or small bowel. Decompressed right colon. Cecum on a lax mesentery. Evidence of prior appendectomy. However, chronic calcification of a lower abdominal small bowel loop which today is on coronal image 42, previously just to the right of midline, and appearance of that loop unchanged from December. No bowel enlargement or regional inflammation there. But furthermore, faint nodular calcification along the noncontrast SMA on series 2, image 55 stable. No pneumoperitoneum. No free fluid. No peritoneal space inflammation is identified. Vascular/Lymphatic: Chronic severe Aortoiliac calcified atherosclerosis. Vascular patency is not evaluated in the absence of IV contrast. Reproductive: Stable and negative noncontrast appearance. Other: No pelvis free fluid. Musculoskeletal: Advanced chronic lumbar spine degeneration is stable. No acute osseous abnormality identified. IMPRESSION: 1. Positive for Moderate to Severe Obstructive Uropathy on the Right due to severe Steinstrasse in the right ureter at and below the pelvic inlet. 2. 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 Recommend follow-up outpatient CT Enterography to evaluate: Abnormal calcification of a distal small bowel loop in the lower abdomen (coronal image 42) and associated suspicious nodular calcification in the nearby small bowel mesentery (coronal image 29). 3. Regressed bilateral staghorn calculi since February but bulky intrarenal calcifications persist. Electronically Signed   By: VEAR Hurst M.D.   On: 10/20/2023 12:08      LOS: 1 day   Ole Bourdon, NP [facility] Pager: [phone]  10/21/2023,  12:07 PM

## 2023-10-21 NOTE — Discharge Summary (Signed)
 Physician Discharge Summary  Patient ID: ABBAGAYLE ZARAGOZA MRN: [id number] DOB/AGE: October 09, 1955 68 y.o.  Admit date: 10/20/2023 Discharge date: 10/21/2023  Admission Diagnoses: Right ureteral Steinstrasse, acute renal failure  Discharge Diagnoses:  Principal Problem:   Kidney stone Active Problems:   Ureteral stone with hydronephrosis   Discharged Condition: good  Hospital Course:  Pt underwernt urgetn bilateral ureterlal stenting on 10/20/23 for large voluem Rt ureteral steinstrasse after previousliy shockwave lithotripsy. Some bilateral renal stones as well. Cr 2.5 on admission. Observed overnight on IV hydration and Cr 1.6 and improving on 10/21/23. Catheter removed and voiding wtihout difficulty and felt to be adequate for discahrge.   Consults: None  Significant Diagnostic Studies: labs: as per above  Treatments: surgery: as per above  Discharge Exam: Blood pressure 136/60, pulse 63, temperature (!) 97.5 F (36.4 C), resp. rate 14, height 5' 2 (1.575 m), weight 72.5 kg, SpO2 98%.  NAD with daughter at bedside. More vigorous today Non-labored breathing on RA SNTND NO CVAT NO foley  NO c/c/e  Disposition: Home   Allergies as of 10/21/2023       Reactions   Enalapril Anaphylaxis   Angioedema Aug 2018        Medication List     TAKE these medications    acetaminophen  500 MG tablet Commonly known as: TYLENOL  Take 500-1,000 mg by mouth every 6 (six) hours as needed (pain.).   amLODipine  5 MG tablet Commonly known as: NORVASC  Take 1 tablet (5 mg total) by mouth daily. What changed: when to take this   Coenzyme Q10 200 MG capsule Take 200 mg by mouth at bedtime.   CRANBERRY PO Take 504 mg by mouth at bedtime.   D-3-5 125 MCG (5000 UT) capsule Generic drug: Cholecalciferol Take 5,000 Units by mouth in the morning.   EPINEPHrine  0.3 mg/0.3 mL Soaj injection Commonly known as: EPI-PEN Inject 0.3 mg into the muscle as needed for anaphylaxis. Then call  [phone]   Farxiga  5 MG Tabs tablet Generic drug: dapagliflozin  propanediol Take 1 tablet (5 mg total) by mouth daily.   glucose blood test strip Test blood sugar once daily (one touch verio if covered by ins/ otherwise whatever ins preference is) E11.65   IMODIUM PO Take 2 tablets by mouth as needed.   Krill Oil 500 MG Caps Take 500 mg by mouth in the morning.   LORazepam  0.5 MG tablet Commonly known as: ATIVAN  Take 0.5-1 tablets (0.25-0.5 mg total) by mouth 2 (two) times daily as needed for anxiety.   metFORMIN  1000 MG tablet Commonly known as: GLUCOPHAGE  Take 1 tablet (1,000 mg total) by mouth 2 (two) times daily with a meal.   multivitamin with minerals Tabs tablet Take 1 tablet by mouth in the morning.   nitrofurantoin  (macrocrystal-monohydrate) 100 MG capsule Commonly known as: MACROBID  Take 100 mg by mouth 2 (two) times daily.   ondansetron  4 MG tablet Commonly known as: Zofran  Take 1 tablet (4 mg total) by mouth daily as needed for nausea or vomiting.   onetouch ultrasoft lancets Use once daily as instructed E11.65 (one touch verio)   OVER THE COUNTER MEDICATION Take 1 tablet by mouth daily. Nervive supplement   oxyCODONE -acetaminophen  5-325 MG tablet Commonly known as: Percocet Take 1 tablet by mouth every 4 (four) hours as needed.   rosuvastatin  10 MG tablet Commonly known as: CRESTOR  Take 1 tablet (10 mg total) by mouth daily. What changed: when to take this   sertraline  100 MG tablet Commonly known as:  ZOLOFT  Take 1 tablet (100 mg total) by mouth daily.   tamsulosin  0.4 MG Caps capsule Commonly known as: Flomax  Take 1 capsule (0.4 mg total) by mouth daily after supper.   Trulicity  3 MG/0.5ML Soaj Generic drug: Dulaglutide  Inject 3 mg as directed once a week. What changed: additional instructions         Signed: Ricardo KATHEE Alvaro Mickey. 10/21/2023, 12:33 PM

## 2023-10-21 NOTE — Care Management CC44 (Signed)
 Condition Code 44 Documentation Completed  Patient Details  Name: CAMELLA SEIM MRN: [id number] Date of Birth: 09/12/1955   Condition Code 44 given:  Yes Patient signature on Condition Code 44 notice:  Yes Documentation of 2 MD's agreement:  Yes Code 44 added to claim:  Yes    Aleila Syverson M Lataja Newland, LCSW 10/21/2023, 12:47 PM

## 2023-10-21 NOTE — TOC CM/SW Note (Signed)
 Transition of Care [facility]) - Inpatient Brief Assessment   Patient Details  Name: Adrienne Davidson MRN: [id number] Date of Birth: 12/10/1955  [facility] CM/SW Contact:    Tawni CHRISTELLA Eva, LCSW Phone Number: 10/21/2023, 12:48 PM    Transition of Care Asessment: Insurance and Status: Insurance coverage has been reviewed Patient has primary care physician: Yes Home environment has been reviewed: home with spouse Prior level of function:: mod independent Prior/Current Home Services: No current home services Social Drivers of Health Review: SDOH reviewed no interventions necessary Readmission risk has been reviewed: Yes Transition of care needs: no transition of care needs at this time

## 2023-10-21 NOTE — Op Note (Signed)
 [name], Adrienne Davidson MEDICAL RECORD NO: [id number] ACCOUNT NO: [id number] DATE OF BIRTH: 1955/08/21 FACILITY: [facility] LOCATION: [id number] PHYSICIAN: Ricardo Likens, MD  Operative Report   DATE OF PROCEDURE: 10/20/2023  PREOPERATIVE DIAGNOSES: 1. Large volume right ureteral steinstrasse. 2. Bilateral renal stones.  PROCEDURE PERFORMED: Cystoscopy, bilateral retrograde pyelograms, interpretation and insertion of bilateral ureteral stents.  ESTIMATED BLOOD LOSS:  Nil.  COMPLICATIONS:  None.  SPECIMENS:  None.  FINDINGS: Severe right hydronephrosis to the distal ureter. Excessive placement of bilateral ureteral stents.  DRAIN:  Foley catheter to straight drain.  INDICATIONS: The patient is a 68 year old lady with a longstanding history of recurrent urolithiasis. She is status post multiple stone surgeries in the last year, including left percutaneous surgery and right shock wave lithotripsy, with the latter  being earlier this month.  She was found on workup of several days of malaise, poor intake, and flank pain to be in acute renal failure with a large volume right ureteral steinstrasse at the referring hospital. I recommended urgent decompression with  stenting. She was transferred for this and presents for this now. Informed consent was obtained and placed in medical record.  DESCRIPTION OF PROCEDURE:  The patient being verified as herself, procedure being cystoscopy, bilateral stent placement was confirmed. Procedure timeout was performed. Intravenous antibiotics were administered. General endotracheal anesthesia induced.  The patient was placed into a low lithotomy position. Sterile field was created, prepped and draped the patient's vagina, introitus, and proximal thighs. Cystourethroscopy was performed using 21-French rigid cystoscope with offset lens. Inspection of  urinary bladder revealed no diverticula, calcifications, papillary lesions.  The urine was somewhat proteinaceous. The  right ureteral orifice was cannulated with a 6-French end-hole catheter and right retrograde pyelogram was obtained.  Right retrograde pyelogram demonstrated single right ureter, single system right kidney. There was severe hydronephrosis too. Large steinstrasse in the distal ureter. A 0.038 sensor wire was very carefully navigated next to this using multiple  angulations to the level of the upper mid calyx, and a 6 x 26 Contour-type stent was carefully placed using cystoscopic and fluoroscopic guidance. Good proximal and distal planes were noted. Copious efflux of urine was seen around and through the distal  end of the stent. Next, left retrograde pyelogram was obtained.  Left retrograde pyelogram demonstrate single left ureter with single-system left kidney. No obvious bony defects were noted. A separate 6 x 26 Contour-type stent was carefully placed using fluoroscopic guidance. Good proximal and distal planes were  noted.  Given her acute renal failure, a 16-French Foley catheter was placed free to straight drain. 10 mL of water  was placed in the balloon. Procedure was terminated.  The patient tolerated the procedure well. No immediate periprocedural complications.  The patient was taken to the Postanesthesia Care Unit in stable condition with plan for observation admission.       PAA D: 10/20/2023 4:49:48 pm T: 10/21/2023 12:34:00 am  JOB: [id number]/ [id number]

## 2023-10-21 NOTE — Care Management Obs Status (Signed)
 MEDICARE OBSERVATION STATUS NOTIFICATION   Patient Details  Name: BREANE GRUNWALD MRN: [id number] Date of Birth: 24-Mar-1956   Medicare Observation Status Notification Given:  Yes    Tawni CHRISTELLA Eva, LCSW 10/21/2023, 12:47 PM

## 2023-10-30 ENCOUNTER — Ambulatory Visit (INDEPENDENT_AMBULATORY_CARE_PROVIDER_SITE_OTHER): Admitting: Urology

## 2023-10-30 ENCOUNTER — Encounter: Payer: Self-pay | Admitting: Urology

## 2023-10-30 VITALS — BP 120/69 | HR 80

## 2023-10-30 DIAGNOSIS — N200 Calculus of kidney: Secondary | ICD-10-CM | POA: Diagnosis not present

## 2023-10-30 LAB — MICROSCOPIC EXAMINATION
RBC, Urine: >30 /HPF — AB (ref 0–2)
WBC, UA: >30 /HPF — AB (ref 0–5)

## 2023-10-30 LAB — URINALYSIS, ROUTINE W REFLEX MICROSCOPIC
Bilirubin, UA: NEGATIVE
Ketones, UA: NEGATIVE
Nitrite, UA: POSITIVE — AB
Specific Gravity, UA: 1.02 (ref 1.005–1.030)
Urobilinogen, Ur: 0.2 mg/dL (ref 0.2–1.0)
pH, UA: 6 (ref 5.0–7.5)

## 2023-10-30 NOTE — Progress Notes (Signed)
 10/30/2023 10:23 AM   Adrienne Davidson 1955-04-25 [id number]  Referring provider: [name], DO [address] [place],  [place] [address]  nephrolithiasis   HPI: [name] is a 68yo here for followup for nephrolithiasis. After ESWL she required bilateral stent placement. She passed numerous fragments. She denies any worsening LUTS. She has intermittent hematuria.    PMH: Past Medical History:  Diagnosis Date   Allergic rhinitis    Allergy    Anxiety    Arthritis    Diabetes mellitus (HCC)    Hyperlipidemia    Hypertension    Psoriasis    Bilateral feet   Sleep apnea    Sleep apnea    Vitamin D  deficiency     Surgical History: Past Surgical History:  Procedure Laterality Date   APPENDECTOMY     CESAREAN SECTION  1980   CESAREAN SECTION  1984   CYSTOSCOPY W/ URETERAL STENT PLACEMENT Bilateral 10/20/2023   Procedure: CYSTOSCOPY, WITH RETROGRADE PYELOGRAM AND URETERAL STENT INSERTION;  Surgeon: Adrienne Davidson., MD;  Location: [facility];  Service: Urology;  Laterality: Bilateral;   EXTRACORPOREAL SHOCK WAVE LITHOTRIPSY Right 10/08/2023   Procedure: LITHOTRIPSY, ESWL;  Surgeon: Adrienne Belvie CROME, MD;  Location: [facility];  Service: Urology;  Laterality: Right;   IR URETERAL STENT RIGHT NEW ACCESS W/O SEP NEPHROSTOMY CATH  05/22/2023   NEPHROLITHOTOMY Left 05/23/2023   Procedure: NEPHROLITHOTOMY PERCUTANEOUS;  Surgeon: Adrienne Belvie CROME, MD;  Location: [facility];  Service: Urology;  Laterality: Left;   NEPHROLITHOTOMY Left 06/24/2023   Procedure: NEPHROLITHOTOMY PERCUTANEOUS- second look;  Surgeon: Adrienne Belvie CROME, MD;  Location: [facility];  Service: Urology;  Laterality: Left;   TONSILLECTOMY      Home Medications:  Allergies as of 10/30/2023       Reactions   Enalapril Anaphylaxis   Angioedema Aug 2018        Medication List        Accurate as of October 30, 2023 10:23 AM. If you have any questions, ask your nurse or doctor.          acetaminophen  500  MG tablet Commonly known as: TYLENOL  Take 500-1,000 mg by mouth every 6 (six) hours as needed (pain.).   amLODipine  5 MG tablet Commonly known as: NORVASC  Take 1 tablet (5 mg total) by mouth daily. What changed: when to take this   Coenzyme Q10 200 MG capsule Take 200 mg by mouth at bedtime.   CRANBERRY PO Take 504 mg by mouth at bedtime.   D-3-5 125 MCG (5000 UT) capsule Generic drug: Cholecalciferol Take 5,000 Units by mouth in the morning.   EPINEPHrine  0.3 mg/0.3 mL Soaj injection Commonly known as: EPI-PEN Inject 0.3 mg into the muscle as needed for anaphylaxis. Then call 911   Farxiga  5 MG Tabs tablet Generic drug: dapagliflozin  propanediol Take 1 tablet (5 mg total) by mouth daily.   glucose blood test strip Test blood sugar once daily (one touch verio if covered by ins/ otherwise whatever ins preference is) E11.65   IMODIUM PO Take 2 tablets by mouth as needed.   Krill Oil 500 MG Caps Take 500 mg by mouth in the morning.   LORazepam  0.5 MG tablet Commonly known as: ATIVAN  Take 0.5-1 tablets (0.25-0.5 mg total) by mouth 2 (two) times daily as needed for anxiety.   metFORMIN  1000 MG tablet Commonly known as: GLUCOPHAGE  Take 1 tablet (1,000 mg total) by mouth 2 (two) times daily with a meal.  multivitamin with minerals Tabs tablet Take 1 tablet by mouth in the morning.   nitrofurantoin  (macrocrystal-monohydrate) 100 MG capsule Commonly known as: MACROBID  Take 100 mg by mouth 2 (two) times daily.   ondansetron  4 MG tablet Commonly known as: Zofran  Take 1 tablet (4 mg total) by mouth daily as needed for nausea or vomiting.   onetouch ultrasoft lancets Use once daily as instructed E11.65 (one touch verio)   OVER THE COUNTER MEDICATION Take 1 tablet by mouth daily. Nervive supplement   oxyCODONE -acetaminophen  5-325 MG tablet Commonly known as: Percocet Take 1 tablet by mouth every 4 (four) hours as needed.   rosuvastatin  10 MG tablet Commonly known  as: CRESTOR  Take 1 tablet (10 mg total) by mouth daily. What changed: when to take this   sertraline  100 MG tablet Commonly known as: ZOLOFT  Take 1 tablet (100 mg total) by mouth daily.   tamsulosin  0.4 MG Caps capsule Commonly known as: Flomax  Take 1 capsule (0.4 mg total) by mouth daily after supper.   Trulicity  3 MG/0.5ML Soaj Generic drug: Dulaglutide  Inject 3 mg as directed once a week. What changed: additional instructions        Allergies:  Allergies  Allergen Reactions   Enalapril Anaphylaxis    Angioedema Aug 2018    Family History: Family History  Problem Relation Age of Onset   Non-Hodgkin's lymphoma Mother    Arthritis Mother    Cancer Mother    Heart disease Father    Arthritis Father    COPD Father    Hypertension Father    Diabetes Daughter    Rheum arthritis Daughter    Polycystic ovary syndrome Daughter    Breast cancer Neg Hx     Social History:  reports that she quit smoking about 5 years ago. Her smoking use included cigarettes. She started smoking about 30 years ago. She has a 6.3 pack-year smoking history. She has never used smokeless tobacco. She reports that she does not currently use alcohol. She reports that she does not currently use drugs.  ROS: All other review of systems were reviewed and are negative except what is noted above in HPI  Physical Exam: BP 120/69   Pulse 80   Constitutional:  Alert and oriented, No acute distress. HEENT: [place] AT, moist mucus membranes.  Trachea midline, no masses. Cardiovascular: No clubbing, cyanosis, or edema. Respiratory: Normal respiratory effort, no increased work of breathing. GI: Abdomen is soft, nontender, nondistended, no abdominal masses GU: No CVA tenderness.  Lymph: No cervical or inguinal lymphadenopathy. Skin: No rashes, bruises or suspicious lesions. Neurologic: Grossly intact, no focal deficits, moving all 4 extremities. Psychiatric: Normal mood and affect.  Laboratory Data: Lab  Results  Component Value Date   WBC 11.5 (H) 10/20/2023   HGB 10.3 (L) 10/20/2023   HCT 30.8 (L) 10/20/2023   MCV 78.2 (L) 10/20/2023   PLT 179 10/20/2023    Lab Results  Component Value Date   CREATININE 1.67 (H) 10/21/2023    No results found for: PSA  No results found for: TESTOSTERONE  Lab Results  Component Value Date   HGBA1C 6.7 (H) 05/21/2023    Urinalysis    Component Value Date/Time   APPEARANCEUR Cloudy (A) 07/10/2023 0853   GLUCOSEU 3+ (A) 07/10/2023 0853   BILIRUBINUR Negative 07/10/2023 0853   PROTEINUR 1+ (A) 07/10/2023 0853   NITRITE Positive (A) 07/10/2023 0853   LEUKOCYTESUR 2+ (A) 07/10/2023 0853    Lab Results  Component Value Date  LABMICR See below: 07/10/2023   WBCUA >30 (A) 07/10/2023   LABEPIT 0-10 07/10/2023   BACTERIA Many (A) 07/10/2023    Pertinent Imaging: CT 10/20/2023: Images reviewed and discussed with the patient  Results for orders placed during the hospital encounter of 10/08/23  DG Abd 1 View  Narrative CLINICAL DATA:  Nephrolithiasis  EXAM: ABDOMEN - 1 VIEW  COMPARISON:  Renal ultrasound 09/02/2023  FINDINGS: A cluster of calcifications in the left mid kidney measures proximally 1.4 by 1.1 cm. This likely represents at least two stones in close proximity.  In the right mid to lower kidney, a large 2.6 by 2.0 cm stone is observed. Just below this projecting over the right kidney lower pole is a 0.9 by 1.0 cm stone.  There is calcification projecting over the left sacroiliac joint and some hazy additional calcifications along the sacrum. On review of the CT scan from 05/28/2023, there is a 1.4 cm partially calcified central mesenteric lesion which could represent a partially calcified central mesenteric lymph node or an aneurysm of the ileocolic artery. There is also a 1.5 cm densely calcified lesion along the wall of adjacent small bowel on image 51 series 2. Small bowel carcinoid with adjacent calcified  lymph node not excluded.  IMPRESSION: 1. Bilateral nephrolithiasis. 2. On review of the CT scan from 05/28/2023, there is a 1.4 cm partially calcified central mesenteric lesion which could represent a partially calcified central mesenteric lymph node or an aneurysm of the ileocolic artery. There is also a 1.5 cm densely calcified lesion along the wall of adjacent small bowel. Small bowel carcinoid with adjacent calcified lymph node not excluded. Consider gastroenterology referral for further workup.   Electronically Signed By: Ryan Salvage M.D. On: 10/08/2023 10:22  No results found for this or any previous visit.  No results found for this or any previous visit.  No results found for this or any previous visit.  Results for orders placed during the hospital encounter of 09/02/23  US  RENAL  Narrative CLINICAL DATA:  Nephrolithiasis follow-up.  EXAM: RENAL / URINARY TRACT ULTRASOUND COMPLETE  COMPARISON:  CT renal stone protocol May 28, 2023  FINDINGS: Right Kidney:  Renal measurements: 10.9 x 5.4 x 5.6 cm = volume: 170.4 mL. 1.7 cm nonobstructing stone identified in the midpole right kidney/renal pelvis. Echogenicity within normal limits. No mass or hydronephrosis visualized.  Left Kidney:  Renal measurements: 9.7 x 5.4 x 5 cm = volume: 136 mL. 1.4 cm nonobstructing stone identified in midpole left kidney. echogenicity within normal limits. No mass or hydronephrosis visualized.  Bladder:  Appears normal for degree of bladder distention.  Other:  None.  IMPRESSION: Bilateral nonobstructing renal stones. No hydronephrosis bilaterally.   Electronically Signed By: Craig Farr M.D. On: 09/02/2023 15:32  No results found for this or any previous visit.  No results found for this or any previous visit.  Results for orders placed during the hospital encounter of 10/20/23  CT Renal Stone Study  Narrative CLINICAL DATA:  68 year old female  with abdomen and flank pain. History of staghorn calculi, status post recent lithotripsy. Previous left side nephrostomy.  EXAM: CT ABDOMEN AND PELVIS WITHOUT CONTRAST  TECHNIQUE: Multidetector CT imaging of the abdomen and pelvis was performed following the standard protocol without IV contrast.  RADIATION DOSE REDUCTION: This exam was performed according to the departmental dose-optimization program which includes automated exposure control, adjustment of the mA and/or kV according to patient size and/or use of iterative reconstruction technique.  COMPARISON:  CT Abdomen and Pelvis 05/28/2023 and earlier.  FINDINGS: Lower chest: Streaky and platelike opacity now at both lung bases, more resembles atelectasis than infection. No pleural effusion. Calcified coronary artery atherosclerosis. Calcified aortic atherosclerosis. No pericardial effusion or cardiomegaly.  Hepatobiliary: Stable and negative noncontrast appearance.  Pancreas: Negative.  Spleen: Negative.  Adrenals/Urinary Tract: Adrenal glands are stable and within normal limits. Left nephrostomy and ureteral stent have been removed since February. Regressed but not resolved left nephrolithiasis since that time, residual calcifications in the left kidney up to 8 mm. No left hydronephrosis or hydroureter.  Contralateral right nephromegaly, moderate to severe right hydronephrosis and right hydroureter. Regression of the previous staghorn calculus, but right lower pole confluent calcifications encompass up to 11 mm. Dilated and tortuous right ureter to the level of clustered calcifications at the pelvic inlet which are numerous. Confluent, up to 15 mm collection of calcifications as seen on coronal image 58. Distal to that additional extensive and confluent distal right ureter calcifications best seen on coronal image 63, more than 2 cm in length and about 6-7 mm diameter (series 2, image 66.  No calculi within the  bladder which seems to remain normal.  Stomach/Bowel: Redundant sigmoid colon with diverticulosis. No dilated large or small bowel. Decompressed right colon. Cecum on a lax mesentery. Evidence of prior appendectomy.  However, chronic calcification of a lower abdominal small bowel loop which today is on coronal image 42, previously just to the right of midline, and appearance of that loop unchanged from December.  No bowel enlargement or regional inflammation there.  But furthermore, faint nodular calcification along the noncontrast SMA on series 2, image 55 stable.  No pneumoperitoneum. No free fluid. No peritoneal space inflammation is identified.  Vascular/Lymphatic: Chronic severe Aortoiliac calcified atherosclerosis. Vascular patency is not evaluated in the absence of IV contrast.  Reproductive: Stable and negative noncontrast appearance.  Other: No pelvis free fluid.  Musculoskeletal: Advanced chronic lumbar spine degeneration is stable. No acute osseous abnormality identified.  IMPRESSION: 1. Positive for Moderate to Severe Obstructive Uropathy on the Right due to severe Steinstrasse in the right ureter at and below the pelvic inlet.  2. 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 Recommend follow-up outpatient CT Enterography to evaluate: Abnormal calcification of a distal small bowel loop in the lower abdomen (coronal image 42) and associated suspicious nodular calcification in the nearby small bowel mesentery (coronal image 29).  3. Regressed bilateral staghorn calculi since February but bulky intrarenal calcifications persist.   Electronically Signed By: VEAR Hurst M.D. On: 10/20/2023 12:08   Assessment & Plan:    1. Kidney stones (Primary) -We discussed the management of kidney stones. These options include observation, ureteroscopy, shockwave lithotripsy (ESWL) and percutaneous  nephrolithotomy (PCNL). We discussed which options are relevant to the patient's stone(s). We discussed the natural history of kidney stones as well as the complications of untreated stones and the impact on quality of life without treatment as well as with each of the above listed treatments. We also discussed the efficacy of each treatment in its ability to clear the stone burden. With any of these management options I discussed the signs and symptoms of infection and the need for emergent treatment should these be experienced. For each option we discussed the ability of each procedure to clear the patient of their stone burden.   For observation I described the risks which include but are not limited to silent renal damage,  life-threatening infection, need for emergent surgery, failure to pass stone and pain.   For ureteroscopy I described the risks which include bleeding, infection, damage to contiguous structures, positioning injury, ureteral stricture, ureteral avulsion, ureteral injury, need for prolonged ureteral stent, inability to perform ureteroscopy, need for an interval procedure, inability to clear stone burden, stent discomfort/pain, heart attack, stroke, pulmonary embolus and the inherent risks with general anesthesia.   For shockwave lithotripsy I described the risks which include arrhythmia, kidney contusion, kidney hemorrhage, need for transfusion, pain, inability to adequately break up stone, inability to pass stone fragments, Steinstrasse, infection associated with obstructing stones, need for alternate surgical procedure, need for repeat shockwave lithotripsy, MI, CVA, PE and the inherent risks with anesthesia/conscious sedation.   For PCNL I described the risks including positioning injury, pneumothorax, hydrothorax, need for chest tube, inability to clear stone burden, renal laceration, arterial venous fistula or malformation, need for embolization of kidney, loss of kidney or renal  function, need for repeat procedure, need for prolonged nephrostomy tube, ureteral avulsion, MI, CVA, PE and the inherent risks of general anesthesia.   - The patient would like to proceed with bilateral ureteroscopic stone extraction - Urinalysis, Routine w reflex microscopic   No follow-ups on file.  [name], MD  [facility] 

## 2023-10-30 NOTE — H&P (View-Only) (Signed)
 10/30/2023 10:23 AM   Adrienne Davidson 1955-04-25 [id number]  Referring provider: [name], DO [address] [place],  [place] [address]  nephrolithiasis   HPI: Adrienne Davidson is a 68yo here for followup for nephrolithiasis. After ESWL she required bilateral stent placement. She passed numerous fragments. She denies any worsening LUTS. She has intermittent hematuria.    PMH: Past Medical History:  Diagnosis Date   Allergic rhinitis    Allergy    Anxiety    Arthritis    Diabetes mellitus (HCC)    Hyperlipidemia    Hypertension    Psoriasis    Bilateral feet   Sleep apnea    Sleep apnea    Vitamin D  deficiency     Surgical History: Past Surgical History:  Procedure Laterality Date   APPENDECTOMY     CESAREAN SECTION  1980   CESAREAN SECTION  1984   CYSTOSCOPY W/ URETERAL STENT PLACEMENT Bilateral 10/20/2023   Procedure: CYSTOSCOPY, WITH RETROGRADE PYELOGRAM AND URETERAL STENT INSERTION;  Surgeon: Alvaro Ricardo KATHEE Mickey., MD;  Location: [facility];  Service: Urology;  Laterality: Bilateral;   EXTRACORPOREAL SHOCK WAVE LITHOTRIPSY Right 10/08/2023   Procedure: LITHOTRIPSY, ESWL;  Surgeon: Sherrilee Belvie CROME, MD;  Location: [facility];  Service: Urology;  Laterality: Right;   IR URETERAL STENT RIGHT NEW ACCESS W/O SEP NEPHROSTOMY CATH  05/22/2023   NEPHROLITHOTOMY Left 05/23/2023   Procedure: NEPHROLITHOTOMY PERCUTANEOUS;  Surgeon: Sherrilee Belvie CROME, MD;  Location: [facility];  Service: Urology;  Laterality: Left;   NEPHROLITHOTOMY Left 06/24/2023   Procedure: NEPHROLITHOTOMY PERCUTANEOUS- second look;  Surgeon: Sherrilee Belvie CROME, MD;  Location: [facility];  Service: Urology;  Laterality: Left;   TONSILLECTOMY      Home Medications:  Allergies as of 10/30/2023       Reactions   Enalapril Anaphylaxis   Angioedema Aug 2018        Medication List        Accurate as of October 30, 2023 10:23 AM. If you have any questions, ask your nurse or doctor.          acetaminophen  500  MG tablet Commonly known as: TYLENOL  Take 500-1,000 mg by mouth every 6 (six) hours as needed (pain.).   amLODipine  5 MG tablet Commonly known as: NORVASC  Take 1 tablet (5 mg total) by mouth daily. What changed: when to take this   Coenzyme Q10 200 MG capsule Take 200 mg by mouth at bedtime.   CRANBERRY PO Take 504 mg by mouth at bedtime.   D-3-5 125 MCG (5000 UT) capsule Generic drug: Cholecalciferol Take 5,000 Units by mouth in the morning.   EPINEPHrine  0.3 mg/0.3 mL Soaj injection Commonly known as: EPI-PEN Inject 0.3 mg into the muscle as needed for anaphylaxis. Then call 911   Farxiga  5 MG Tabs tablet Generic drug: dapagliflozin  propanediol Take 1 tablet (5 mg total) by mouth daily.   glucose blood test strip Test blood sugar once daily (one touch verio if covered by ins/ otherwise whatever ins preference is) E11.65   IMODIUM PO Take 2 tablets by mouth as needed.   Krill Oil 500 MG Caps Take 500 mg by mouth in the morning.   LORazepam  0.5 MG tablet Commonly known as: ATIVAN  Take 0.5-1 tablets (0.25-0.5 mg total) by mouth 2 (two) times daily as needed for anxiety.   metFORMIN  1000 MG tablet Commonly known as: GLUCOPHAGE  Take 1 tablet (1,000 mg total) by mouth 2 (two) times daily with a meal.  multivitamin with minerals Tabs tablet Take 1 tablet by mouth in the morning.   nitrofurantoin  (macrocrystal-monohydrate) 100 MG capsule Commonly known as: MACROBID  Take 100 mg by mouth 2 (two) times daily.   ondansetron  4 MG tablet Commonly known as: Zofran  Take 1 tablet (4 mg total) by mouth daily as needed for nausea or vomiting.   onetouch ultrasoft lancets Use once daily as instructed E11.65 (one touch verio)   OVER THE COUNTER MEDICATION Take 1 tablet by mouth daily. Nervive supplement   oxyCODONE -acetaminophen  5-325 MG tablet Commonly known as: Percocet Take 1 tablet by mouth every 4 (four) hours as needed.   rosuvastatin  10 MG tablet Commonly known  as: CRESTOR  Take 1 tablet (10 mg total) by mouth daily. What changed: when to take this   sertraline  100 MG tablet Commonly known as: ZOLOFT  Take 1 tablet (100 mg total) by mouth daily.   tamsulosin  0.4 MG Caps capsule Commonly known as: Flomax  Take 1 capsule (0.4 mg total) by mouth daily after supper.   Trulicity  3 MG/0.5ML Soaj Generic drug: Dulaglutide  Inject 3 mg as directed once a week. What changed: additional instructions        Allergies:  Allergies  Allergen Reactions   Enalapril Anaphylaxis    Angioedema Aug 2018    Family History: Family History  Problem Relation Age of Onset   Non-Hodgkin's lymphoma Mother    Arthritis Mother    Cancer Mother    Heart disease Father    Arthritis Father    COPD Father    Hypertension Father    Diabetes Daughter    Rheum arthritis Daughter    Polycystic ovary syndrome Daughter    Breast cancer Neg Hx     Social History:  reports that she quit smoking about 5 years ago. Her smoking use included cigarettes. She started smoking about 30 years ago. She has a 6.3 pack-year smoking history. She has never used smokeless tobacco. She reports that she does not currently use alcohol. She reports that she does not currently use drugs.  ROS: All other review of systems were reviewed and are negative except what is noted above in HPI  Physical Exam: BP 120/69   Pulse 80   Constitutional:  Alert and oriented, No acute distress. HEENT: [place] AT, moist mucus membranes.  Trachea midline, no masses. Cardiovascular: No clubbing, cyanosis, or edema. Respiratory: Normal respiratory effort, no increased work of breathing. GI: Abdomen is soft, nontender, nondistended, no abdominal masses GU: No CVA tenderness.  Lymph: No cervical or inguinal lymphadenopathy. Skin: No rashes, bruises or suspicious lesions. Neurologic: Grossly intact, no focal deficits, moving all 4 extremities. Psychiatric: Normal mood and affect.  Laboratory Data: Lab  Results  Component Value Date   WBC 11.5 (H) 10/20/2023   HGB 10.3 (L) 10/20/2023   HCT 30.8 (L) 10/20/2023   MCV 78.2 (L) 10/20/2023   PLT 179 10/20/2023    Lab Results  Component Value Date   CREATININE 1.67 (H) 10/21/2023    No results found for: PSA  No results found for: TESTOSTERONE  Lab Results  Component Value Date   HGBA1C 6.7 (H) 05/21/2023    Urinalysis    Component Value Date/Time   APPEARANCEUR Cloudy (A) 07/10/2023 0853   GLUCOSEU 3+ (A) 07/10/2023 0853   BILIRUBINUR Negative 07/10/2023 0853   PROTEINUR 1+ (A) 07/10/2023 0853   NITRITE Positive (A) 07/10/2023 0853   LEUKOCYTESUR 2+ (A) 07/10/2023 0853    Lab Results  Component Value Date  LABMICR See below: 07/10/2023   WBCUA >30 (A) 07/10/2023   LABEPIT 0-10 07/10/2023   BACTERIA Many (A) 07/10/2023    Pertinent Imaging: CT 10/20/2023: Images reviewed and discussed with the patient  Results for orders placed during the hospital encounter of 10/08/23  DG Abd 1 View  Narrative CLINICAL DATA:  Nephrolithiasis  EXAM: ABDOMEN - 1 VIEW  COMPARISON:  Renal ultrasound 09/02/2023  FINDINGS: A cluster of calcifications in the left mid kidney measures proximally 1.4 by 1.1 cm. This likely represents at least two stones in close proximity.  In the right mid to lower kidney, a large 2.6 by 2.0 cm stone is observed. Just below this projecting over the right kidney lower pole is a 0.9 by 1.0 cm stone.  There is calcification projecting over the left sacroiliac joint and some hazy additional calcifications along the sacrum. On review of the CT scan from 05/28/2023, there is a 1.4 cm partially calcified central mesenteric lesion which could represent a partially calcified central mesenteric lymph node or an aneurysm of the ileocolic artery. There is also a 1.5 cm densely calcified lesion along the wall of adjacent small bowel on image 51 series 2. Small bowel carcinoid with adjacent calcified  lymph node not excluded.  IMPRESSION: 1. Bilateral nephrolithiasis. 2. On review of the CT scan from 05/28/2023, there is a 1.4 cm partially calcified central mesenteric lesion which could represent a partially calcified central mesenteric lymph node or an aneurysm of the ileocolic artery. There is also a 1.5 cm densely calcified lesion along the wall of adjacent small bowel. Small bowel carcinoid with adjacent calcified lymph node not excluded. Consider gastroenterology referral for further workup.   Electronically Signed By: Ryan Salvage M.D. On: 10/08/2023 10:22  No results found for this or any previous visit.  No results found for this or any previous visit.  No results found for this or any previous visit.  Results for orders placed during the hospital encounter of 09/02/23  US  RENAL  Narrative CLINICAL DATA:  Nephrolithiasis follow-up.  EXAM: RENAL / URINARY TRACT ULTRASOUND COMPLETE  COMPARISON:  CT renal stone protocol May 28, 2023  FINDINGS: Right Kidney:  Renal measurements: 10.9 x 5.4 x 5.6 cm = volume: 170.4 mL. 1.7 cm nonobstructing stone identified in the midpole right kidney/renal pelvis. Echogenicity within normal limits. No mass or hydronephrosis visualized.  Left Kidney:  Renal measurements: 9.7 x 5.4 x 5 cm = volume: 136 mL. 1.4 cm nonobstructing stone identified in midpole left kidney. echogenicity within normal limits. No mass or hydronephrosis visualized.  Bladder:  Appears normal for degree of bladder distention.  Other:  None.  IMPRESSION: Bilateral nonobstructing renal stones. No hydronephrosis bilaterally.   Electronically Signed By: Craig Farr M.D. On: 09/02/2023 15:32  No results found for this or any previous visit.  No results found for this or any previous visit.  Results for orders placed during the hospital encounter of 10/20/23  CT Renal Stone Study  Narrative CLINICAL DATA:  68 year old female  with abdomen and flank pain. History of staghorn calculi, status post recent lithotripsy. Previous left side nephrostomy.  EXAM: CT ABDOMEN AND PELVIS WITHOUT CONTRAST  TECHNIQUE: Multidetector CT imaging of the abdomen and pelvis was performed following the standard protocol without IV contrast.  RADIATION DOSE REDUCTION: This exam was performed according to the departmental dose-optimization program which includes automated exposure control, adjustment of the mA and/or kV according to patient size and/or use of iterative reconstruction technique.  COMPARISON:  CT Abdomen and Pelvis 05/28/2023 and earlier.  FINDINGS: Lower chest: Streaky and platelike opacity now at both lung bases, more resembles atelectasis than infection. No pleural effusion. Calcified coronary artery atherosclerosis. Calcified aortic atherosclerosis. No pericardial effusion or cardiomegaly.  Hepatobiliary: Stable and negative noncontrast appearance.  Pancreas: Negative.  Spleen: Negative.  Adrenals/Urinary Tract: Adrenal glands are stable and within normal limits. Left nephrostomy and ureteral stent have been removed since February. Regressed but not resolved left nephrolithiasis since that time, residual calcifications in the left kidney up to 8 mm. No left hydronephrosis or hydroureter.  Contralateral right nephromegaly, moderate to severe right hydronephrosis and right hydroureter. Regression of the previous staghorn calculus, but right lower pole confluent calcifications encompass up to 11 mm. Dilated and tortuous right ureter to the level of clustered calcifications at the pelvic inlet which are numerous. Confluent, up to 15 mm collection of calcifications as seen on coronal image 58. Distal to that additional extensive and confluent distal right ureter calcifications best seen on coronal image 63, more than 2 cm in length and about 6-7 mm diameter (series 2, image 66.  No calculi within the  bladder which seems to remain normal.  Stomach/Bowel: Redundant sigmoid colon with diverticulosis. No dilated large or small bowel. Decompressed right colon. Cecum on a lax mesentery. Evidence of prior appendectomy.  However, chronic calcification of a lower abdominal small bowel loop which today is on coronal image 42, previously just to the right of midline, and appearance of that loop unchanged from December.  No bowel enlargement or regional inflammation there.  But furthermore, faint nodular calcification along the noncontrast SMA on series 2, image 55 stable.  No pneumoperitoneum. No free fluid. No peritoneal space inflammation is identified.  Vascular/Lymphatic: Chronic severe Aortoiliac calcified atherosclerosis. Vascular patency is not evaluated in the absence of IV contrast.  Reproductive: Stable and negative noncontrast appearance.  Other: No pelvis free fluid.  Musculoskeletal: Advanced chronic lumbar spine degeneration is stable. No acute osseous abnormality identified.  IMPRESSION: 1. Positive for Moderate to Severe Obstructive Uropathy on the Right due to severe Steinstrasse in the right ureter at and below the pelvic inlet.  2. 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 Recommend follow-up outpatient CT Enterography to evaluate: Abnormal calcification of a distal small bowel loop in the lower abdomen (coronal image 42) and associated suspicious nodular calcification in the nearby small bowel mesentery (coronal image 29).  3. Regressed bilateral staghorn calculi since February but bulky intrarenal calcifications persist.   Electronically Signed By: VEAR Hurst M.D. On: 10/20/2023 12:08   Assessment & Plan:    1. Kidney stones (Primary) -We discussed the management of kidney stones. These options include observation, ureteroscopy, shockwave lithotripsy (ESWL) and percutaneous  nephrolithotomy (PCNL). We discussed which options are relevant to the patient's stone(s). We discussed the natural history of kidney stones as well as the complications of untreated stones and the impact on quality of life without treatment as well as with each of the above listed treatments. We also discussed the efficacy of each treatment in its ability to clear the stone burden. With any of these management options I discussed the signs and symptoms of infection and the need for emergent treatment should these be experienced. For each option we discussed the ability of each procedure to clear the patient of their stone burden.   For observation I described the risks which include but are not limited to silent renal damage,  life-threatening infection, need for emergent surgery, failure to pass stone and pain.   For ureteroscopy I described the risks which include bleeding, infection, damage to contiguous structures, positioning injury, ureteral stricture, ureteral avulsion, ureteral injury, need for prolonged ureteral stent, inability to perform ureteroscopy, need for an interval procedure, inability to clear stone burden, stent discomfort/pain, heart attack, stroke, pulmonary embolus and the inherent risks with general anesthesia.   For shockwave lithotripsy I described the risks which include arrhythmia, kidney contusion, kidney hemorrhage, need for transfusion, pain, inability to adequately break up stone, inability to pass stone fragments, Steinstrasse, infection associated with obstructing stones, need for alternate surgical procedure, need for repeat shockwave lithotripsy, MI, CVA, PE and the inherent risks with anesthesia/conscious sedation.   For PCNL I described the risks including positioning injury, pneumothorax, hydrothorax, need for chest tube, inability to clear stone burden, renal laceration, arterial venous fistula or malformation, need for embolization of kidney, loss of kidney or renal  function, need for repeat procedure, need for prolonged nephrostomy tube, ureteral avulsion, MI, CVA, PE and the inherent risks of general anesthesia.   - The patient would like to proceed with bilateral ureteroscopic stone extraction - Urinalysis, Routine w reflex microscopic   No follow-ups on file.  [name], MD  [facility] 

## 2023-10-30 NOTE — Patient Instructions (Signed)
Ureteroscopy  Ureteroscopy is a procedure to check for and treat problems inside part of the urinary tract. In this procedure, a long rigid or flexible tube with a lens and light at the end (ureteroscope) is used to look at the inside of the kidneys and the ureters. The ureters are the tubes that carry urine from the kidneys to the bladder. The ureteroscope is inserted into one or both of the ureters. You may need this procedure if you have frequent urinary tract infections (UTIs), blood in your urine, or a stone in one or both of your ureters. A ureteroscopy can be done: To find the cause of urine blockage in a ureter and to evaluate other abnormalities inside the ureters or kidneys. To remove stones. To remove or treat growths of tissue (polyps), abnormal tissue, and some types of tumors. To remove a tissue sample and check it for disease under a microscope (biopsy). Tell a health care provider about: Any allergies you have. All medicines you are taking, including vitamins, herbs, eye drops, creams, and over-the-counter medicines. Any problems you or family members have had with anesthetic medicines. Any bleeding problems you have. Any surgeries you have had. Any medical conditions you have. Whether you are pregnant or may be pregnant. What are the risks? Your health care provider will talk with you about risks. These may include: Abdominal pain or a burning feeling or pain while urinating. Abnormal bleeding. A UTI. Allergic reactions to medicines. Scarring that narrows the ureter (stricture) or swelling. Creating a hole (perforation) in the ureter. Damage to other structures or organs, such as the part of your body that drains urine from your bladder (urethra), your bladder, or your uterus. What happens before the procedure? When to stop eating and drinking  8 hours before your procedure Stop eating most foods. Do not eat meat, fried foods, or fatty foods. Eat only light foods, such  as toast or crackers. All liquids are okay except energy drinks and alcohol. 6 hours before your procedure Stop eating. Drink only clear liquids, such as water, clear fruit juice, black coffee, plain tea, and sports drinks. Do not drink energy drinks or alcohol. 2 hours before your procedure Stop drinking all liquids. You may be allowed to take medicines with small sips of water. Medicines Ask your health care provider about: Changing or stopping your regular medicines. These include any diabetes medicines or blood thinners you take. Taking medicines such as aspirin and ibuprofen. These medicines can thin your blood. Do not take these medicines unless your health care provider tells you to. Taking over-the-counter medicines, vitamins, herbs, and supplements. General instructions Do not use any products that contain nicotine or tobacco for at least 4 weeks before the procedure. These products include cigarettes, chewing tobacco, and vaping devices, such as e-cigarettes. If you need help quitting, ask your health care provider. If you will be going home right after the procedure, plan to have a responsible adult: Take you home from the hospital or clinic. You will not be allowed to drive. Care for you for the time you are told. Ask your health care provider what steps will be taken to help prevent infection. These may include: Washing skin with a soap that kills germs. Receiving antibiotic medicine. Tests You may have an exam or testing. You may have a urine sample taken to check for infection. What happens during the procedure? An IV will be inserted into one of your veins. You may be given: A sedative. This helps   you relax. Anesthesia. This will: Numb certain areas of your body. Make you fall asleep for surgery. Your urethra will be cleaned with a germ-killing solution. The ureteroscope will be passed through your urethra into your bladder. A salt-water solution will be sent through  the ureteroscope to fill your bladder. This will help the health care provider see the openings of your ureters more clearly. The ureteroscope will be passed into your ureter. If a growth is found, a biopsy may be done. If a stone is found, it may be removed through the ureteroscope, or the stone may be broken up using a laser, shock waves, or electrical energy. In some cases, if the ureter is too small, a tube may be inserted that keeps the ureter open (ureteral stent). The stent may be left in place for 1 or 2 weeks, and then the ureteroscopy procedure will be done again. The scope will be removed, and your bladder will be emptied. The procedure may vary among health care providers and hospitals. What happens after the procedure? Your blood pressure, heart rate, breathing rate, and blood oxygen level will be monitored until you leave the hospital or clinic. It is up to you to get the results of your procedure. Ask your health care provider, or the department that is doing the procedure, when your results will be ready. Summary Ureteroscopy is a procedure used to look at the inside of the kidneys and the ureters. You may need this procedure if you have frequent urinary tract infections (UTIs), blood in your urine, or a stone in one or both of your ureters. Follow instructions from your health care provider about eating and drinking. In some cases, if the ureter is too small, a tube may be inserted that keeps the ureter open (ureteral stent). The stent may be left in place for 1 or 2 weeks to keep the ureter open, and then the ureteroscopy procedure will be done again. This information is not intended to replace advice given to you by your health care provider. Make sure you discuss any questions you have with your health care provider. Document Revised: 03/12/2022 Document Reviewed: 03/12/2022 Elsevier Patient Education  2024 [employer]  

## 2023-11-08 ENCOUNTER — Encounter (HOSPITAL_COMMUNITY): Payer: Self-pay

## 2023-11-08 ENCOUNTER — Encounter (HOSPITAL_COMMUNITY)
Admission: RE | Admit: 2023-11-08 | Discharge: 2023-11-08 | Disposition: A | Discharge status: Home / Self Care | Source: Ambulatory Visit | Attending: Urology | Admitting: Urology

## 2023-11-12 ENCOUNTER — Ambulatory Visit (INDEPENDENT_AMBULATORY_CARE_PROVIDER_SITE_OTHER): Admitting: *Deleted

## 2023-11-12 DIAGNOSIS — E1129 Type 2 diabetes mellitus with other diabetic kidney complication: Secondary | ICD-10-CM

## 2023-11-12 DIAGNOSIS — R809 Proteinuria, unspecified: Secondary | ICD-10-CM | POA: Diagnosis not present

## 2023-11-12 LAB — HM DIABETES EYE EXAM

## 2023-11-12 NOTE — Progress Notes (Signed)
 Adrienne Davidson arrived 11/12/2023 and has given verbal consent to obtain images and complete their overdue diabetic retinal screening.  The images have been sent to an ophthalmologist or optometrist for review and interpretation.  Results will be sent back to [name], DO for review.  Patient has been informed they will be contacted when we receive the results via telephone or MyChart

## 2023-11-13 MED ORDER — GENTAMICIN SULFATE 40 MG/ML IJ SOLN
5.0000 mg/kg | INTRAVENOUS | Status: AC
  Administered 2023-11-14: 294.4 mg via INTRAVENOUS
  Filled 2023-11-13: qty 7.25

## 2023-11-14 ENCOUNTER — Ambulatory Visit (HOSPITAL_COMMUNITY)

## 2023-11-14 ENCOUNTER — Ambulatory Visit (HOSPITAL_COMMUNITY)
Admission: RE | Admit: 2023-11-14 | Discharge: 2023-11-14 | Disposition: A | Discharge status: Home / Self Care | Attending: Urology | Admitting: Urology

## 2023-11-14 ENCOUNTER — Ambulatory Visit (HOSPITAL_COMMUNITY): Admitting: Anesthesiology

## 2023-11-14 ENCOUNTER — Encounter (HOSPITAL_COMMUNITY)
Admission: RE | Disposition: A | Discharge status: Home / Self Care | Payer: Self-pay | Source: Home / Self Care | Attending: Urology

## 2023-11-14 ENCOUNTER — Other Ambulatory Visit: Payer: Self-pay | Admitting: Urology

## 2023-11-14 DIAGNOSIS — N200 Calculus of kidney: Secondary | ICD-10-CM | POA: Diagnosis not present

## 2023-11-14 DIAGNOSIS — Z7984 Long term (current) use of oral hypoglycemic drugs: Secondary | ICD-10-CM | POA: Insufficient documentation

## 2023-11-14 DIAGNOSIS — N202 Calculus of kidney with calculus of ureter: Secondary | ICD-10-CM | POA: Diagnosis not present

## 2023-11-14 DIAGNOSIS — N201 Calculus of ureter: Secondary | ICD-10-CM | POA: Diagnosis not present

## 2023-11-14 DIAGNOSIS — Z87891 Personal history of nicotine dependence: Secondary | ICD-10-CM | POA: Insufficient documentation

## 2023-11-14 DIAGNOSIS — I10 Essential (primary) hypertension: Secondary | ICD-10-CM | POA: Diagnosis not present

## 2023-11-14 DIAGNOSIS — E119 Type 2 diabetes mellitus without complications: Secondary | ICD-10-CM | POA: Insufficient documentation

## 2023-11-14 HISTORY — PX: CYSTOSCOPY/URETEROSCOPY/HOLMIUM LASER/STENT PLACEMENT: SHX6546

## 2023-11-14 HISTORY — PX: HOLMIUM LASER APPLICATION: SHX5852

## 2023-11-14 HISTORY — PX: CYSTOSCOPY/RETROGRADE/URETEROSCOPY/STONE EXTRACTION WITH BASKET: SHX5317

## 2023-11-14 LAB — GLUCOSE, CAPILLARY
Glucose-Capillary: 156 mg/dL — ABNORMAL HIGH (ref 70–99)
Glucose-Capillary: 162 mg/dL — ABNORMAL HIGH (ref 70–99)

## 2023-11-14 LAB — STONE ANALYSIS
Ammonium Acid Urate Calculi: 10 %
Calcium Oxalate Dihydrate: 10 %
Calcium Oxalate Monohydrate: 80 %
Weight Calculi: 454 mg

## 2023-11-14 SURGERY — CYSTOSCOPY/URETEROSCOPY/HOLMIUM LASER/STENT PLACEMENT
Anesthesia: General | Site: Ureter | Laterality: Right

## 2023-11-14 MED ORDER — LACTATED RINGERS IV SOLN: INTRAVENOUS | Status: DC

## 2023-11-14 MED ORDER — FENTANYL CITRATE (PF) 100 MCG/2ML IJ SOLN
INTRAMUSCULAR | Status: AC
  Filled 2023-11-14: qty 2

## 2023-11-14 MED ORDER — EPHEDRINE 5 MG/ML INJ
INTRAVENOUS | Status: AC
  Filled 2023-11-14: qty 5

## 2023-11-14 MED ORDER — PROPOFOL 10 MG/ML IV BOLUS
INTRAVENOUS | Status: DC | PRN
  Administered 2023-11-14 (×2): 50 mg via INTRAVENOUS
  Administered 2023-11-14: 150 mg via INTRAVENOUS

## 2023-11-14 MED ORDER — DEXAMETHASONE SODIUM PHOSPHATE 10 MG/ML IJ SOLN
INTRAMUSCULAR | Status: DC | PRN
  Administered 2023-11-14: 5 mg via INTRAVENOUS

## 2023-11-14 MED ORDER — PROPOFOL 10 MG/ML IV BOLUS
INTRAVENOUS | Status: AC
  Filled 2023-11-14: qty 20

## 2023-11-14 MED ORDER — LIDOCAINE 2% (20 MG/ML) 5 ML SYRINGE
INTRAMUSCULAR | Status: DC | PRN
  Administered 2023-11-14: 60 mg via INTRAVENOUS

## 2023-11-14 MED ORDER — ORAL CARE MOUTH RINSE: 15.0000 mL | Freq: Once | OROMUCOSAL | Status: AC

## 2023-11-14 MED ORDER — DIATRIZOATE MEGLUMINE 30 % UR SOLN
URETHRAL | Status: AC
  Filled 2023-11-14: qty 100

## 2023-11-14 MED ORDER — FENTANYL CITRATE (PF) 100 MCG/2ML IJ SOLN
INTRAMUSCULAR | Status: DC | PRN
  Administered 2023-11-14 (×2): 50 ug via INTRAVENOUS
  Administered 2023-11-14: 100 ug via INTRAVENOUS

## 2023-11-14 MED ORDER — DEXAMETHASONE SODIUM PHOSPHATE 10 MG/ML IJ SOLN
INTRAMUSCULAR | Status: AC
  Filled 2023-11-14: qty 3

## 2023-11-14 MED ORDER — DIATRIZOATE MEGLUMINE 30 % UR SOLN
URETHRAL | Status: DC | PRN
  Administered 2023-11-14: 10 mL via URETHRAL

## 2023-11-14 MED ORDER — OXYCODONE HCL 5 MG/5ML PO SOLN: 5.0000 mg | Freq: Once | ORAL | Status: DC | PRN

## 2023-11-14 MED ORDER — WATER FOR IRRIGATION, STERILE IR SOLN
Status: DC | PRN
  Administered 2023-11-14: 500 mL

## 2023-11-14 MED ORDER — SODIUM CHLORIDE 0.9 % IR SOLN
Status: DC | PRN
  Administered 2023-11-14: 3000 mL

## 2023-11-14 MED ORDER — OXYCODONE-ACETAMINOPHEN 5-325 MG PO TABS: 1.0000 | ORAL_TABLET | ORAL | 0 refills | Status: DC | PRN

## 2023-11-14 MED ORDER — ONDANSETRON HCL 4 MG/2ML IJ SOLN: 4.0000 mg | Freq: Once | INTRAMUSCULAR | Status: DC | PRN

## 2023-11-14 MED ORDER — ONDANSETRON HCL 4 MG/2ML IJ SOLN
INTRAMUSCULAR | Status: AC
  Filled 2023-11-14: qty 6

## 2023-11-14 MED ORDER — CHLORHEXIDINE GLUCONATE 0.12 % MT SOLN
15.0000 mL | Freq: Once | OROMUCOSAL | Status: AC
  Administered 2023-11-14: 15 mL via OROMUCOSAL

## 2023-11-14 MED ORDER — OXYCODONE HCL 5 MG PO TABS: 5.0000 mg | ORAL_TABLET | Freq: Once | ORAL | Status: DC | PRN

## 2023-11-14 MED ORDER — ONDANSETRON HCL 4 MG/2ML IJ SOLN
INTRAMUSCULAR | Status: DC | PRN
  Administered 2023-11-14: 4 mg via INTRAVENOUS

## 2023-11-14 MED ORDER — LIDOCAINE 2% (20 MG/ML) 5 ML SYRINGE
INTRAMUSCULAR | Status: AC
  Filled 2023-11-14: qty 15

## 2023-11-14 MED ORDER — PHENYLEPHRINE 80 MCG/ML (10ML) SYRINGE FOR IV PUSH (FOR BLOOD PRESSURE SUPPORT)
PREFILLED_SYRINGE | INTRAVENOUS | Status: AC
Start: 2023-11-14 — End: 2023-11-14
  Filled 2023-11-14: qty 30

## 2023-11-14 MED ORDER — FENTANYL CITRATE PF 50 MCG/ML IJ SOSY: 25.0000 ug | PREFILLED_SYRINGE | INTRAMUSCULAR | Status: DC | PRN

## 2023-11-14 SURGICAL SUPPLY — 23 items
BAG DRAIN URO TABLE W/ADPT NS (BAG) ×1 IMPLANT
BAG HAMPER (MISCELLANEOUS) ×1 IMPLANT
CATH INTERMIT  6FR 70CM (CATHETERS) ×1 IMPLANT
CLOTH BEACON ORANGE TIMEOUT ST (SAFETY) ×1 IMPLANT
EXTRACTOR STONE NITINOL NGAGE (UROLOGICAL SUPPLIES) ×1 IMPLANT
GLOVE BIO SURGEON STRL SZ8 (GLOVE) ×1 IMPLANT
GLOVE BIOGEL PI IND STRL 6.5 (GLOVE) IMPLANT
GLOVE BIOGEL PI IND STRL 7.0 (GLOVE) ×2 IMPLANT
GOWN STRL REUS W/TWL LRG LVL3 (GOWN DISPOSABLE) ×1 IMPLANT
GOWN STRL REUS W/TWL XL LVL3 (GOWN DISPOSABLE) ×1 IMPLANT
GUIDEWIRE ANG ZIPWIRE 038X150 (WIRE) ×1 IMPLANT
GUIDEWIRE STR DUAL SENSOR (WIRE) ×1 IMPLANT
KIT TURNOVER CYSTO (KITS) ×1 IMPLANT
MANIFOLD NEPTUNE II (INSTRUMENTS) ×1 IMPLANT
PACK CYSTO (CUSTOM PROCEDURE TRAY) ×1 IMPLANT
PAD ARMBOARD POSITIONER FOAM (MISCELLANEOUS) ×1 IMPLANT
POSITIONER HEAD 8X9X4 ADT (SOFTGOODS) ×1 IMPLANT
SOL .9 NS 3000ML IRR UROMATIC (IV SOLUTION) ×2 IMPLANT
STENT URET 6FRX26 CONTOUR (STENTS) IMPLANT
SYR 10ML LL (SYRINGE) ×1 IMPLANT
TOWEL OR 17X26 4PK STRL BLUE (TOWEL DISPOSABLE) ×1 IMPLANT
TRACTIP FLEXIVA PULS ID 200XHI (Laser) IMPLANT
WATER STERILE IRR 500ML POUR (IV SOLUTION) ×1 IMPLANT

## 2023-11-14 NOTE — Anesthesia Preprocedure Evaluation (Signed)
 Anesthesia Evaluation  Patient identified by MRN, date of birth, ID band Patient awake    Reviewed: Allergy & Precautions, H&P , NPO status , Patient's Chart, lab work & pertinent test results, reviewed documented beta blocker date and time   Airway Mallampati: II  TM Distance: >3 FB Neck ROM: full    Dental no notable dental hx.    Pulmonary neg pulmonary ROS, former smoker   Pulmonary exam normal breath sounds clear to auscultation       Cardiovascular Exercise Tolerance: Good hypertension, negative cardio ROS  Rhythm:regular Rate:Normal     Neuro/Psych negative neurological ROS  negative psych ROS   GI/Hepatic negative GI ROS, Neg liver ROS,,,  Endo/Other  negative endocrine ROSdiabetes    Renal/GU negative Renal ROS  negative genitourinary   Musculoskeletal   Abdominal   Peds  Hematology negative hematology ROS (+)   Anesthesia Other Findings   Reproductive/Obstetrics negative OB ROS                             Anesthesia Physical Anesthesia Plan  ASA: 2  Anesthesia Plan: General and General LMA   Post-op Pain Management:    Induction:   PONV Risk Score and Plan: Ondansetron  Airway Management Planned:   Additional Equipment:   Intra-op Plan:   Post-operative Plan:   Informed Consent: I have reviewed the patients History and Physical, chart, labs and discussed the procedure including the risks, benefits and alternatives for the proposed anesthesia with the patient or authorized representative who has indicated his/her understanding and acceptance.     Dental Advisory Given  Plan Discussed with: CRNA  Anesthesia Plan Comments:        Anesthesia Quick Evaluation

## 2023-11-14 NOTE — Anesthesia Procedure Notes (Signed)
 Procedure Name: LMA Insertion Date/Time: 11/14/2023 10:48 AM  Performed by: Cordella Elvie HERO, CRNAPre-anesthesia Checklist: Patient identified, Emergency Drugs available, Suction available, Patient being monitored and Timeout performed Patient Re-evaluated:Patient Re-evaluated prior to induction Oxygen Delivery Method: Circle system utilized Preoxygenation: Pre-oxygenation with 100% oxygen Induction Type: IV induction LMA: LMA inserted LMA Size: 4.0 Number of attempts: 1 Placement Confirmation: positive ETCO2, CO2 detector and breath sounds checked- equal and bilateral Tube secured with: Tape Dental Injury: Teeth and Oropharynx as per pre-operative assessment

## 2023-11-14 NOTE — Op Note (Signed)
 Preoperative diagnosis: Right ureteral stone  Postoperative diagnosis: Same  Procedure: 1 cystoscopy 2 right retrograde pyelography 3.  Intraoperative fluoroscopy, under one hour, with interpretation 4.  Right ureteroscopic stone manipulation with laser lithotripsy 5.  Right 6 x 26 JJ stent placement  Attending: Belvie Standing  Anesthesia: General  Estimated blood loss: None  Drains: Right 6 x 26 JJ ureteral stent without tether  Specimens: stone for analysis  Antibiotics: ancef   Findings: numerous impacted right distal and mid ureteral calculi. Moderate to severe hydronephrosis.  Indications: Patient is a 68 year old female with a history of ureteral stones who failed ESWL and required stent placement for pain control.  After discussing treatment options, she decided proceed with right ureteroscopic stone manipulation.  Procedure in detail: The patient was brought to the operating room and a brief timeout was done to ensure correct patient, correct procedure, correct site.  General anesthesia was administered patient was placed in dorsal lithotomy position.  Her genitalia was then prepped and draped in usual sterile fashion.  A rigid 22 French cystoscope was passed in the urethra and the bladder.  Bladder was inspected free masses or lesions.  the right ureteral orifices were in the normal orthotopic locations.  a 6 french ureteral catheter was then instilled into the right ureter orifice.  a gentle retrograde was obtained and findings noted above. Using a grasper the right ureteral stent was brought to the urethral meatus.  we then placed a zip wire through the ureteral stent  and advanced up to the renal pelvis.  We removed the stent. we then removed the cystoscope and cannulated the right ureteral orifice with a semirigid ureteroscope.  we then encountered the stones in the distal and mid ureter. Using a 242 nm laser fiber and fragmented the stone into smaller pieces.  the pieces  were then removed with a Ngage basket. once the stone fragments were removed we then placed a 6 x 26 double-j ureteral stent over the original zip wire.   We then removed the wire and good coil was noted in the the renal pelvis under fluoroscopy and the bladder under direct vision.    the stone fragments were then removed from the bladder and sent for analysis.   the bladder was then drained and this concluded the procedure which was well tolerated by patient.  Complications: None  Condition: Stable, extubated, transferred to PACU  Plan: Patient is to be discharged home as to follow-up in 2 weeks for bilateral ureteroscopic stone extraction.

## 2023-11-14 NOTE — Interval H&P Note (Signed)
 History and Physical Interval Note:  11/14/2023 10:38 AM  Adrienne Davidson  has presented today for surgery, with the diagnosis of bilateral renal calculi.  The various methods of treatment have been discussed with the patient and family. After consideration of risks, benefits and other options for treatment, the patient has consented to  Procedure(s) with comments: CYSTOSCOPY/URETEROSCOPY/HOLMIUM LASER/STENT PLACEMENT (Bilateral) - pt to arrive at 8:30 HOLMIUM LASER APPLICATION (Bilateral) as a surgical intervention.  The patient's history has been reviewed, patient examined, no change in status, stable for surgery.  I have reviewed the patient's chart and labs.  Questions were answered to the patient's satisfaction.     [name]

## 2023-11-14 NOTE — Transfer of Care (Signed)
 Immediate Anesthesia Transfer of Care Note  Patient: Adrienne Davidson  Procedure(s) Performed: CYSTOSCOPY/URETEROSCOPY/HOLMIUM LASER/STENT PLACEMENT (Right: Ureter) CYSTOSCOPY, WITH CALCULUS REMOVAL USING BASKET (Right: Ureter) HOLMIUM LASER APPLICATION (Right: Ureter)  Patient Location: PACU  Anesthesia Type:General  Level of Consciousness: awake, drowsy, and patient cooperative  Airway & Oxygen Therapy: Patient Spontanous Breathing and Patient connected to face mask oxygen  Post-op Assessment: Report given to RN, Post -op Vital signs reviewed and stable, and Patient moving all extremities X 4  Post vital signs: Reviewed and stable  Last Vitals:  Vitals Value Taken Time  BP 122/69 11/14/23 12:56  Temp 98.9 1258  Pulse 102 11/14/23 12:58  Resp 17 11/14/23 12:58  SpO2 98 % 11/14/23 12:58  Vitals shown include unfiled device data.  Last Pain:  Vitals:   11/14/23 0845  PainSc: 0-No pain         Complications: No notable events documented.

## 2023-11-15 ENCOUNTER — Ambulatory Visit: Payer: Self-pay | Admitting: Family Medicine

## 2023-11-15 ENCOUNTER — Encounter (HOSPITAL_COMMUNITY): Payer: Self-pay | Admitting: Urology

## 2023-11-15 ENCOUNTER — Other Ambulatory Visit: Payer: Self-pay

## 2023-11-15 DIAGNOSIS — N200 Calculus of kidney: Secondary | ICD-10-CM

## 2023-11-16 NOTE — Anesthesia Postprocedure Evaluation (Signed)
 Anesthesia Post Note  Patient: Adrienne Davidson  Procedure(s) Performed: CYSTOSCOPY/URETEROSCOPY/HOLMIUM LASER/STENT PLACEMENT (Right: Ureter) CYSTOSCOPY, WITH CALCULUS REMOVAL USING BASKET (Right: Ureter) HOLMIUM LASER APPLICATION (Right: Ureter)  Patient location during evaluation: Phase II Anesthesia Type: General Level of consciousness: awake Pain management: pain level controlled Vital Signs Assessment: post-procedure vital signs reviewed and stable Respiratory status: spontaneous breathing and respiratory function stable Cardiovascular status: blood pressure returned to baseline and stable Postop Assessment: no headache and no apparent nausea or vomiting Anesthetic complications: no Comments: Late entry   No notable events documented.   Last Vitals:  Vitals:   11/14/23 1330 11/14/23 1346  BP: 125/66 122/72  Pulse: 88 84  Resp: 18   Temp: 36.8 C 36.7 C  SpO2: 95% 94%    Last Pain:  Vitals:   11/15/23 1417  TempSrc:   PainSc: 0-No pain                 [name]

## 2023-11-21 ENCOUNTER — Ambulatory Visit: Admitting: Urology

## 2023-11-23 LAB — STONE ANALYSIS
Calcium Oxalate Dihydrate: 10 %
Calcium Oxalate Monohydrate: 70 %
Uric Acid Calculi: 20 %
Weight Calculi: 623 mg

## 2023-12-13 ENCOUNTER — Telehealth: Payer: Self-pay | Admitting: Pharmacist

## 2023-12-13 DIAGNOSIS — E119 Type 2 diabetes mellitus without complications: Secondary | ICD-10-CM

## 2023-12-13 DIAGNOSIS — E1169 Type 2 diabetes mellitus with other specified complication: Secondary | ICD-10-CM

## 2023-12-13 DIAGNOSIS — I152 Hypertension secondary to endocrine disorders: Secondary | ICD-10-CM

## 2023-12-13 MED ORDER — ROSUVASTATIN CALCIUM 10 MG PO TABS: 10.0000 mg | ORAL_TABLET | Freq: Every day | ORAL | 3 refills | Status: DC

## 2023-12-13 NOTE — Telephone Encounter (Signed)
 12/13/2023 Name: Adrienne Davidson MRN: [id number] DOB: November 27, 1955  Chief Complaint  Patient presents with   Medication Management    [id number] to PharmD   Subjective:  Will request to meet with patient to discuss chronic conditions (T2DM, HLD, HTN) and medication adherence.  Diabetes:  Current medications:  Trulicity  (dulaglutide ) 3mg  weekly Farxiga  (dapagliflozin ) 5mg  daily Metformin  1G twice daily Medications tried in the past:  Ozempic  (semaglutide )-cost Victoza  (liraglutide ) Januvia (sitagliptin) 25mg  daily  Hyperlipidemia Rosuvastatin  10mg  evenings (refills sent today) LF 09/17/23 for #90 [id number] Past meds: gemfibrozil  (stopped 2023)  Clinical ASCVD: No  The 10-year ASCVD risk score (Arnett DK, et al., 2019) is: 18.4%   Values used to calculate the score:     Age: 68 years     Clincally relevant sex: Female     Is Non-Hispanic African American: No     Diabetic: Yes     Tobacco smoker: No     Systolic Blood Pressure: 122 mmHg     Is BP treated: Yes     HDL Cholesterol: 65 mg/dL     Total Cholesterol: 136 mg/dL   Objective:  Lab Results  Component Value Date   HGBA1C 6.7 (H) 05/21/2023   Lab Results  Component Value Date   CHOL 136 06/13/2022   HDL 65 06/13/2022   LDLCALC 51 06/13/2022   LDLDIRECT 116 (H) 10/23/2019   TRIG 109 06/13/2022   CHOLHDL 2.1 06/13/2022    Medications Reviewed Today     Reviewed by Billee Mliss BIRCH, [facility] (Pharmacist) on 12/13/23 at [id number]  Med List Status: <None>   Medication Order Taking? Sig Documenting Provider Last Dose Status Informant  acetaminophen  (TYLENOL ) 500 MG tablet [id number]  Take 500-1,000 mg by mouth every 6 (six) hours as needed (pain.). [provider]  Active Self  amLODipine  (NORVASC ) 5 MG tablet [id number] No Take 1 tablet (5 mg total) by mouth daily.  Patient taking differently: Take 5 mg by mouth every evening.   Jolinda Potter M, DO 11/13/2023 Active Self  Cholecalciferol (D-3-5) 125 MCG (5000 UT)  capsule [id number] No Take 5,000 Units by mouth in the morning. [provider] 11/13/2023 Active Self  Coenzyme Q10 200 MG capsule [id number] No Take 200 mg by mouth at bedtime. [provider] 11/13/2023 Active Self  CRANBERRY PO [id number] No Take 500 mg by mouth at bedtime. [provider] 11/13/2023 Active Self  Dulaglutide  (TRULICITY ) 3 MG/0.5ML SOPN [id number] No Inject 3 mg as directed once a week. Jolinda Potter M, DO 11/04/2023 Active Self           Med Note JERALYN, KYLE A   Thu Nov 07, 2023  2:49 PM) Mondays  EPINEPHrine  0.3 mg/0.3 mL IJ SOAJ injection [id number]  Inject 0.3 mg into the muscle as needed for anaphylaxis. Then call 911 Jolinda Potter M, [place]  Active Self  FARXIGA  5 MG TABS tablet [id number] No Take 1 tablet (5 mg total) by mouth daily. Jolinda Potter HERO, DO 11/09/2023 Active Self           Med Note (WARD, ANGELICA G   Sun Oct 20, 2023 12:09 PM)    glucose blood test strip [id number]  Test blood sugar once daily (one touch verio if covered by ins/ otherwise whatever ins preference is) E11.65 Jolinda Potter HERO, DO  Active Self  Krill Oil 500 MG CAPS [id number] No Take 500 mg by mouth in the morning. [provider] 11/13/2023 Active Self  Lancets JANETT ULTRASOFT) lancets [id number]  Use once daily as instructed E11.65 (one touch verio) Jolinda Potter M, DO  Active Self  LORazepam  (ATIVAN ) 0.5 MG tablet [id number]  Take 0.5-1 tablets (0.25-0.5 mg total) by mouth 2 (two) times daily as needed for anxiety. Jolinda Potter M, DO  Active Self  metFORMIN  (GLUCOPHAGE ) 1000 MG tablet [id number] No Take 1 tablet (1,000 mg total) by mouth 2 (two) times daily with a meal. Jolinda Potter HERO, DO 11/13/2023 Active Self  Multiple Vitamin (MULTIVITAMIN WITH MINERALS) TABS tablet [id number] No Take 1 tablet by mouth in the morning. [provider] 11/13/2023 Active Self  OVER THE COUNTER MEDICATION [id number]  Take 1 tablet by mouth at bedtime.  Nervive supplement [provider]  Active Self  oxyCODONE -acetaminophen  (PERCOCET) 5-325 MG tablet [id number]  Take 1 tablet by mouth every 4 (four) hours as needed. McKenzie, Belvie CROME, MD  Active   rosuvastatin  (CRESTOR ) 10 MG tablet [id number] No Take 1 tablet (10 mg total) by mouth daily.  Patient taking differently: Take 10 mg by mouth every evening.   Jolinda Potter M, DO 11/13/2023 Active Self  sertraline  (ZOLOFT ) 100 MG tablet [id number] No Take 1 tablet (100 mg total) by mouth daily. Jolinda Potter [place], DO 11/13/2023 Active Self            Mliss Tarry Griffin, PharmD, BCACP, CPP Clinical Pharmacist, [facility]

## 2023-12-18 ENCOUNTER — Telehealth: Payer: Self-pay

## 2023-12-18 ENCOUNTER — Other Ambulatory Visit: Payer: Self-pay

## 2023-12-18 ENCOUNTER — Other Ambulatory Visit

## 2023-12-18 DIAGNOSIS — Z09 Encounter for follow-up examination after completed treatment for conditions other than malignant neoplasm: Secondary | ICD-10-CM

## 2023-12-18 LAB — URINALYSIS, ROUTINE W REFLEX MICROSCOPIC
Bilirubin, UA: NEGATIVE
Ketones, UA: NEGATIVE
Nitrite, UA: POSITIVE — AB
Specific Gravity, UA: 1.02 (ref 1.005–1.030)
Urobilinogen, Ur: 0.2 mg/dL (ref 0.2–1.0)
pH, UA: 6 (ref 5.0–7.5)

## 2023-12-18 LAB — MICROSCOPIC EXAMINATION: RBC, Urine: >30 /HPF — AB (ref 0–2)

## 2023-12-18 MED ORDER — NITROFURANTOIN MONOHYD MACRO 100 MG PO CAPS: 100.0000 mg | ORAL_CAPSULE | Freq: Two times a day (BID) | ORAL | 0 refills | Status: DC

## 2023-12-18 NOTE — Telephone Encounter (Addendum)
 Tried calling patient with no answer. Left detailed vm making pt aware urine appears infected and Macrobid  100 mg BID X 7 days Per MD, McKenzie

## 2023-12-18 NOTE — Telephone Encounter (Signed)
Pt was made aware and voiced understanding

## 2023-12-24 ENCOUNTER — Encounter (HOSPITAL_COMMUNITY): Payer: Self-pay

## 2023-12-24 ENCOUNTER — Encounter (HOSPITAL_COMMUNITY)
Admission: RE | Admit: 2023-12-24 | Discharge: 2023-12-24 | Disposition: A | Discharge status: Home / Self Care | Source: Ambulatory Visit | Attending: Urology | Admitting: Urology

## 2023-12-24 LAB — URINE CULTURE

## 2023-12-25 NOTE — Anesthesia Preprocedure Evaluation (Signed)
 Anesthesia Evaluation  Patient identified by MRN, date of birth, ID band Patient awake    Reviewed: Allergy & Precautions, H&P , NPO status , Patient's Chart, lab work & pertinent test results, reviewed documented beta blocker date and time   Airway Mallampati: II  TM Distance: >3 FB Neck ROM: full    Dental no notable dental hx. (+) Dental Advisory Given, Teeth Intact   Pulmonary sleep apnea and Continuous Positive Airway Pressure Ventilation , former smoker   Pulmonary exam normal breath sounds clear to auscultation       Cardiovascular Exercise Tolerance: Good hypertension, Normal cardiovascular exam Rhythm:regular Rate:Normal     Neuro/Psych   Anxiety     negative neurological ROS     GI/Hepatic negative GI ROS, Neg liver ROS,,,  Endo/Other  diabetes, Type 2    Renal/GU negative Renal ROS  negative genitourinary   Musculoskeletal  (+) Arthritis , Osteoarthritis,    Abdominal   Peds  Hematology negative hematology ROS (+)   Anesthesia Other Findings angioedema  Reproductive/Obstetrics negative OB ROS                              Anesthesia Physical Anesthesia Plan  ASA: 3  Anesthesia Plan: General   Post-op Pain Management: Dilaudid  IV   Induction: Intravenous  PONV Risk Score and Plan: Ondansetron , Midazolam  and Dexamethasone   Airway Management Planned: Oral ETT  Additional Equipment: None  Intra-op Plan:   Post-operative Plan: Extubation in OR  Informed Consent: I have reviewed the patients History and Physical, chart, labs and discussed the procedure including the risks, benefits and alternatives for the proposed anesthesia with the patient or authorized representative who has indicated his/her understanding and acceptance.     Dental Advisory Given  Plan Discussed with: CRNA  Anesthesia Plan Comments:          Anesthesia Quick Evaluation

## 2023-12-26 ENCOUNTER — Ambulatory Visit (HOSPITAL_COMMUNITY)

## 2023-12-26 ENCOUNTER — Encounter (HOSPITAL_COMMUNITY)
Admission: RE | Disposition: A | Discharge status: Home / Self Care | Payer: Self-pay | Source: Home / Self Care | Attending: Urology

## 2023-12-26 ENCOUNTER — Ambulatory Visit (HOSPITAL_BASED_OUTPATIENT_CLINIC_OR_DEPARTMENT_OTHER): Admitting: Anesthesiology

## 2023-12-26 ENCOUNTER — Encounter (HOSPITAL_COMMUNITY): Admitting: Anesthesiology

## 2023-12-26 ENCOUNTER — Other Ambulatory Visit: Payer: Self-pay

## 2023-12-26 ENCOUNTER — Ambulatory Visit: Payer: Self-pay

## 2023-12-26 ENCOUNTER — Ambulatory Visit (HOSPITAL_COMMUNITY)
Admission: RE | Admit: 2023-12-26 | Discharge: 2023-12-26 | Disposition: A | Discharge status: Home / Self Care | Attending: Urology | Admitting: Urology

## 2023-12-26 ENCOUNTER — Encounter (HOSPITAL_COMMUNITY): Payer: Self-pay | Admitting: Urology

## 2023-12-26 DIAGNOSIS — I10 Essential (primary) hypertension: Secondary | ICD-10-CM | POA: Diagnosis not present

## 2023-12-26 DIAGNOSIS — Z87891 Personal history of nicotine dependence: Secondary | ICD-10-CM | POA: Insufficient documentation

## 2023-12-26 DIAGNOSIS — N202 Calculus of kidney with calculus of ureter: Secondary | ICD-10-CM

## 2023-12-26 DIAGNOSIS — Z7984 Long term (current) use of oral hypoglycemic drugs: Secondary | ICD-10-CM | POA: Diagnosis not present

## 2023-12-26 DIAGNOSIS — N200 Calculus of kidney: Secondary | ICD-10-CM | POA: Diagnosis not present

## 2023-12-26 DIAGNOSIS — G4733 Obstructive sleep apnea (adult) (pediatric): Secondary | ICD-10-CM

## 2023-12-26 DIAGNOSIS — E119 Type 2 diabetes mellitus without complications: Secondary | ICD-10-CM | POA: Diagnosis not present

## 2023-12-26 DIAGNOSIS — M1990 Unspecified osteoarthritis, unspecified site: Secondary | ICD-10-CM | POA: Insufficient documentation

## 2023-12-26 DIAGNOSIS — G4730 Sleep apnea, unspecified: Secondary | ICD-10-CM | POA: Insufficient documentation

## 2023-12-26 DIAGNOSIS — E785 Hyperlipidemia, unspecified: Secondary | ICD-10-CM | POA: Insufficient documentation

## 2023-12-26 DIAGNOSIS — N201 Calculus of ureter: Secondary | ICD-10-CM | POA: Diagnosis present

## 2023-12-26 HISTORY — PX: HOLMIUM LASER APPLICATION: SHX5852

## 2023-12-26 HISTORY — PX: CYSTOSCOPY/URETEROSCOPY/HOLMIUM LASER/STENT PLACEMENT: SHX6546

## 2023-12-26 HISTORY — PX: CYSTOSCOPY/RETROGRADE/URETEROSCOPY/STONE EXTRACTION WITH BASKET: SHX5317

## 2023-12-26 LAB — GLUCOSE, CAPILLARY
Glucose-Capillary: 138 mg/dL — ABNORMAL HIGH (ref 70–99)
Glucose-Capillary: 129 mg/dL — ABNORMAL HIGH (ref 70–99)

## 2023-12-26 SURGERY — CYSTOSCOPY/URETEROSCOPY/HOLMIUM LASER/STENT PLACEMENT
Anesthesia: General | Site: Ureter | Laterality: Bilateral

## 2023-12-26 MED ORDER — PROPOFOL 10 MG/ML IV BOLUS
INTRAVENOUS | Status: AC
  Filled 2023-12-26: qty 20

## 2023-12-26 MED ORDER — MIDAZOLAM HCL 5 MG/5ML IJ SOLN
INTRAMUSCULAR | Status: DC | PRN
  Administered 2023-12-26: 2 mg via INTRAVENOUS

## 2023-12-26 MED ORDER — SODIUM CHLORIDE 0.9 % IV SOLN: 12.5000 mg | INTRAVENOUS | Status: DC | PRN

## 2023-12-26 MED ORDER — OXYCODONE HCL 5 MG PO TABS: 5.0000 mg | ORAL_TABLET | Freq: Once | ORAL | Status: DC | PRN

## 2023-12-26 MED ORDER — ORAL CARE MOUTH RINSE: 15.0000 mL | Freq: Once | OROMUCOSAL | Status: AC

## 2023-12-26 MED ORDER — HYDROMORPHONE HCL 1 MG/ML IJ SOLN: 0.2500 mg | INTRAMUSCULAR | Status: DC | PRN

## 2023-12-26 MED ORDER — EPHEDRINE 5 MG/ML INJ
INTRAVENOUS | Status: AC
  Filled 2023-12-26: qty 5

## 2023-12-26 MED ORDER — PHENYLEPHRINE HCL (PRESSORS) 10 MG/ML IV SOLN
INTRAVENOUS | Status: DC | PRN
  Administered 2023-12-26: 80 ug via INTRAVENOUS

## 2023-12-26 MED ORDER — FENTANYL CITRATE (PF) 100 MCG/2ML IJ SOLN
INTRAMUSCULAR | Status: DC | PRN
  Administered 2023-12-26 (×4): 25 ug via INTRAVENOUS

## 2023-12-26 MED ORDER — SODIUM CHLORIDE 0.9 % IV SOLN
INTRAVENOUS | Status: AC
  Filled 2023-12-26: qty 20

## 2023-12-26 MED ORDER — HYDROCODONE-ACETAMINOPHEN 5-325 MG PO TABS: 1.0000 | ORAL_TABLET | Freq: Four times a day (QID) | ORAL | 0 refills | Status: AC | PRN

## 2023-12-26 MED ORDER — CHLORHEXIDINE GLUCONATE 0.12 % MT SOLN: 15.0000 mL | Freq: Once | OROMUCOSAL | Status: AC

## 2023-12-26 MED ORDER — PROPOFOL 10 MG/ML IV BOLUS
INTRAVENOUS | Status: DC | PRN
  Administered 2023-12-26: 150 mg via INTRAVENOUS

## 2023-12-26 MED ORDER — ONDANSETRON HCL 4 MG/2ML IJ SOLN
INTRAMUSCULAR | Status: AC
  Filled 2023-12-26: qty 2

## 2023-12-26 MED ORDER — OXYCODONE HCL 5 MG/5ML PO SOLN: 5.0000 mg | Freq: Once | ORAL | Status: DC | PRN

## 2023-12-26 MED ORDER — ACETAMINOPHEN 500 MG PO TABS
ORAL_TABLET | ORAL | Status: AC
  Administered 2023-12-26: 1000 mg via ORAL
  Filled 2023-12-26: qty 2

## 2023-12-26 MED ORDER — DEXAMETHASONE SODIUM PHOSPHATE 10 MG/ML IJ SOLN
INTRAMUSCULAR | Status: AC
  Filled 2023-12-26: qty 2

## 2023-12-26 MED ORDER — LIDOCAINE 2% (20 MG/ML) 5 ML SYRINGE
INTRAMUSCULAR | Status: AC
  Filled 2023-12-26: qty 10

## 2023-12-26 MED ORDER — LACTATED RINGERS IV SOLN: INTRAVENOUS | Status: DC

## 2023-12-26 MED ORDER — ACETAMINOPHEN 500 MG PO TABS: 1000.0000 mg | ORAL_TABLET | Freq: Once | ORAL | Status: AC

## 2023-12-26 MED ORDER — FENTANYL CITRATE (PF) 100 MCG/2ML IJ SOLN
INTRAMUSCULAR | Status: AC
  Filled 2023-12-26: qty 2

## 2023-12-26 MED ORDER — WATER FOR IRRIGATION, STERILE IR SOLN
Status: DC | PRN
  Administered 2023-12-26: 500 mL

## 2023-12-26 MED ORDER — ACETAMINOPHEN 160 MG/5ML PO SOLN
960.0000 mg | Freq: Once | ORAL | Status: AC
  Filled 2023-12-26: qty 30

## 2023-12-26 MED ORDER — MIDAZOLAM HCL 2 MG/2ML IJ SOLN
INTRAMUSCULAR | Status: AC
  Filled 2023-12-26: qty 2

## 2023-12-26 MED ORDER — CHLORHEXIDINE GLUCONATE 0.12 % MT SOLN
OROMUCOSAL | Status: AC
  Administered 2023-12-26: 15 mL via OROMUCOSAL
  Filled 2023-12-26: qty 15

## 2023-12-26 MED ORDER — SODIUM CHLORIDE 0.9 % IR SOLN
Status: DC | PRN
  Administered 2023-12-26: 3000 mL

## 2023-12-26 MED ORDER — SODIUM CHLORIDE 0.9 % IV SOLN
2.0000 g | INTRAVENOUS | Status: AC
  Administered 2023-12-26: 2 g via INTRAVENOUS

## 2023-12-26 MED ORDER — DEXAMETHASONE SODIUM PHOSPHATE 4 MG/ML IJ SOLN
INTRAMUSCULAR | Status: DC | PRN
  Administered 2023-12-26: 4 mg via INTRAVENOUS

## 2023-12-26 MED ORDER — DIATRIZOATE MEGLUMINE 30 % UR SOLN
URETHRAL | Status: DC | PRN
  Administered 2023-12-26: 18 mL via URETHRAL

## 2023-12-26 MED ORDER — PHENYLEPHRINE 80 MCG/ML (10ML) SYRINGE FOR IV PUSH (FOR BLOOD PRESSURE SUPPORT)
PREFILLED_SYRINGE | INTRAVENOUS | Status: AC
  Filled 2023-12-26: qty 10

## 2023-12-26 MED ORDER — ONDANSETRON HCL 4 MG/2ML IJ SOLN
INTRAMUSCULAR | Status: DC | PRN
  Administered 2023-12-26: 4 mg via INTRAVENOUS

## 2023-12-26 MED ORDER — LIDOCAINE HCL (CARDIAC) PF 100 MG/5ML IV SOSY
PREFILLED_SYRINGE | INTRAVENOUS | Status: DC | PRN
  Administered 2023-12-26: 50 mg via INTRAVENOUS

## 2023-12-26 MED ORDER — DIATRIZOATE MEGLUMINE 30 % UR SOLN
URETHRAL | Status: AC
  Filled 2023-12-26: qty 100

## 2023-12-26 MED ORDER — EPHEDRINE SULFATE (PRESSORS) 50 MG/ML IJ SOLN
INTRAMUSCULAR | Status: DC | PRN
  Administered 2023-12-26 (×2): 10 mg via INTRAVENOUS

## 2023-12-26 SURGICAL SUPPLY — 23 items
BAG DRAIN URO TABLE W/ADPT NS (BAG) ×2 IMPLANT
BAG HAMPER (MISCELLANEOUS) ×2 IMPLANT
BASKET NITINOL 4 WIRE 16 (BASKET) ×2 IMPLANT
CLOTH BEACON ORANGE TIMEOUT ST (SAFETY) ×2 IMPLANT
EXTRACTOR STONE NITINOL NGAGE (UROLOGICAL SUPPLIES) ×2 IMPLANT
GLOVE BIO SURGEON STRL SZ8 (GLOVE) ×2 IMPLANT
GLOVE BIOGEL PI IND STRL 7.0 (GLOVE) ×4 IMPLANT
GOWN STRL REUS W/TWL LRG LVL3 (GOWN DISPOSABLE) ×2 IMPLANT
GOWN STRL REUS W/TWL XL LVL3 (GOWN DISPOSABLE) ×2 IMPLANT
GUIDEWIRE STR DUAL SENSOR (WIRE) ×2 IMPLANT
GUIDEWIRE STR ZIPWIRE 035X150 (MISCELLANEOUS) ×2 IMPLANT
KIT TURNOVER CYSTO (KITS) ×2 IMPLANT
MANIFOLD NEPTUNE II (INSTRUMENTS) ×2 IMPLANT
PACK CYSTO (CUSTOM PROCEDURE TRAY) ×2 IMPLANT
PAD ARMBOARD POSITIONER FOAM (MISCELLANEOUS) ×2 IMPLANT
POSITIONER HEAD 8X9X4 ADT (SOFTGOODS) ×2 IMPLANT
SHEATH NAVIGATOR HD 11/13X36 (SHEATH) ×2 IMPLANT
SOL .9 NS 3000ML IRR UROMATIC (IV SOLUTION) ×4 IMPLANT
STENT URET 6FRX26 CONTOUR (STENTS) IMPLANT
SYR 10ML LL (SYRINGE) ×2 IMPLANT
TOWEL OR 17X26 4PK STRL BLUE (TOWEL DISPOSABLE) ×2 IMPLANT
TRACTIP FLEXIVA PULS ID 200XHI (Laser) IMPLANT
WATER STERILE IRR 500ML POUR (IV SOLUTION) ×2 IMPLANT

## 2023-12-26 NOTE — Op Note (Signed)
 SABRAPreoperative diagnosis: bilateral renal calculi  Postoperative diagnosis: Same  Procedure: 1 cystoscopy 2. bilateralretrograde pyelography 3.  Intraoperative fluoroscopy, under one hour, with interpretation 4.  Bilateral ureteroscopic stone manipulation with laser lithotripsy 5.  bilateral 6 x 26 JJ stent exchange  Attending: Belvie Standing  Anesthesia: General  Estimated blood loss: None  Drains: bilateral 6 x 26 JJ ureteral stent with tether  Specimens: stone for analysis  Antibiotics: ancef   Findings: left mid and lower pole renal calculi. Right distal ureteral calculi and a lower pole calculus. No hydronephrosis. No masses/lesions in the bladder. Ureteral orifices in normal anatomic location.  Indications: Patient is a 68 year old female with a history of bilateral renal calculi and right ureteral calculi. After discussing treatment options, they decided proceed with bilateral ureteroscopic stone manipulation.  Procedure in detail: The patient was brought to the operating room and a brief timeout was done to ensure correct patient, correct procedure, correct site.  General anesthesia was administered patient was placed in dorsal lithotomy position.  Her genitalia was then prepped and draped in usual sterile fashion.  A rigid 22 French cystoscope was passed in the urethra and the bladder.  Bladder was inspected free masses or lesions.  the ureteral orifices were in the normal orthotopic locations. a 6 french ureteral catheter was then instilled into the left ureteral orifice.  a gentle retrograde was obtained and findings noted above. Using a grasper the left ureteral stent was brought to the urethral meatus. We then advanced a zipwire through the stent and up to the renal pelvis.  The stent was removed. we then removed the cystoscope and cannulated the left ureteral orifice with a semirigid ureteroscope.  We located no stone in the ureter. We then placed a sensor wire up to the  renal pelvis. We removed the scope and advanced a 12/14 x 35cm access sheath up to the renal pelvis. We then used the flexible ureteroscope to perform nephroscopy. We located calculi in the mid and lower poles which were fragmented with a 242nm laser fiber. The fragments were removed with an NGage basket. Once the stone were removed we then removed the access sheath under direct vision and noted to injury to the ureter.  We then placed a 6 x 26 double-j ureteral stent over the original zip wire. We then removed the wire and good coil was noted in the the renal pelvis under fluoroscopy and the bladder under direct vision.  We then turned out attention to the right side. a 6 french ureteral catheter was then instilled into the right ureteral orifice.  a gentle retrograde was obtained and findings noted above. Using a grasper the right ureteral stent was brought to the urethral meatus. We then advanced a zipwire through the stent and up to the renal pelvis.We then removed the stent. we then removed the cystoscope and cannulated the right ureteral orifice with a semirigid ureteroscope.  We located multiple stones in the mid and distal ureter which were fragmented with a 242nm laser fiber. The fragments were removed with an NGage basket. We then repeated ureteroscopy to the UPJ. We then placed a sensor wire up to the renal pelvis. We removed the scope and advanced a 12/14 x 38cm access sheath up to the renal pelvis. We then used the flexible ureteroscope to perform nephroscopy. We located calculi in the lower pole which were removed with an NGage basket. Once the stone were removed we then removed the access sheath under direct vision and noted to injury  to the ureter. we then placed a 6 x 26 double-j ureteral stent over the original zip wire.  We then removed the wire and good coil was noted in the the renal pelvis under fluoroscopy and the bladder under direct vision.   the bladder was then drained and this concluded  the procedure which was well tolerated by patient.  Complications: None  Condition: Stable, extubated, transferred to PACU  Plan: Patient is to be discharged home as to follow-up in 2 weeks. She is to remove her stents by pulling the tether in 72 hours

## 2023-12-26 NOTE — Anesthesia Procedure Notes (Signed)
 Procedure Name: LMA Insertion Date/Time: 12/26/2023 7:48 AM  Performed by: Pheobe Adine CROME, CRNAPre-anesthesia Checklist: Patient identified, Emergency Drugs available, Suction available, Patient being monitored and Timeout performed Patient Re-evaluated:Patient Re-evaluated prior to induction Oxygen Delivery Method: Circle system utilized Preoxygenation: Pre-oxygenation with 100% oxygen Induction Type: IV induction LMA: LMA inserted LMA Size: 3.0 Number of attempts: 1 Placement Confirmation: positive ETCO2, CO2 detector and breath sounds checked- equal and bilateral Tube secured with: Tape Dental Injury: Teeth and Oropharynx as per pre-operative assessment

## 2023-12-26 NOTE — H&P (Signed)
 HPI: Ms Sek is a 68yo here for followup for nephrolithiasis. After ESWL she required bilateral stent placement. She passed numerous fragments. She denies any worsening LUTS. She has intermittent hematuria.      PMH:     Past Medical History:  Diagnosis Date   Allergic rhinitis     Allergy     Anxiety     Arthritis     Diabetes mellitus (HCC)     Hyperlipidemia     Hypertension     Psoriasis      Bilateral feet   Sleep apnea     Sleep apnea     Vitamin D  deficiency            Surgical History:      Past Surgical History:  Procedure Laterality Date   APPENDECTOMY       CESAREAN SECTION   1980   CESAREAN SECTION   1984   CYSTOSCOPY W/ URETERAL STENT PLACEMENT Bilateral 10/20/2023    Procedure: CYSTOSCOPY, WITH RETROGRADE PYELOGRAM AND URETERAL STENT INSERTION;  Surgeon: Alvaro Ricardo KATHEE Mickey., MD;  Location: [facility];  Service: Urology;  Laterality: Bilateral;   EXTRACORPOREAL SHOCK WAVE LITHOTRIPSY Right 10/08/2023    Procedure: LITHOTRIPSY, ESWL;  Surgeon: Sherrilee Belvie CROME, MD;  Location: [facility];  Service: Urology;  Laterality: Right;   IR URETERAL STENT RIGHT NEW ACCESS W/O SEP NEPHROSTOMY CATH   05/22/2023   NEPHROLITHOTOMY Left 05/23/2023    Procedure: NEPHROLITHOTOMY PERCUTANEOUS;  Surgeon: Sherrilee Belvie CROME, MD;  Location: [facility];  Service: Urology;  Laterality: Left;   NEPHROLITHOTOMY Left 06/24/2023    Procedure: NEPHROLITHOTOMY PERCUTANEOUS- second look;  Surgeon: Sherrilee Belvie CROME, MD;  Location: [facility];  Service: Urology;  Laterality: Left;   TONSILLECTOMY              Home Medications:  Allergies as of 10/30/2023         Reactions    Enalapril Anaphylaxis    Angioedema Aug 2018            Medication List           Accurate as of October 30, 2023 10:23 AM. If you have any questions, ask your nurse or doctor.              acetaminophen  500 MG tablet Commonly known as: TYLENOL  Take 500-1,000 mg by mouth every 6 (six) hours as needed (pain.).     amLODipine  5 MG tablet Commonly known as: NORVASC  Take 1 tablet (5 mg total) by mouth daily. What changed: when to take this    Coenzyme Q10 200 MG capsule Take 200 mg by mouth at bedtime.    CRANBERRY PO Take 504 mg by mouth at bedtime.    D-3-5 125 MCG (5000 UT) capsule Generic drug: Cholecalciferol Take 5,000 Units by mouth in the morning.    EPINEPHrine  0.3 mg/0.3 mL Soaj injection Commonly known as: EPI-PEN Inject 0.3 mg into the muscle as needed for anaphylaxis. Then call 911    Farxiga  5 MG Tabs tablet Generic drug: dapagliflozin  propanediol Take 1 tablet (5 mg total) by mouth daily.    glucose blood test strip Test blood sugar once daily (one touch verio if covered by ins/ otherwise whatever ins preference is) E11.65    IMODIUM PO Take 2 tablets by mouth as needed.    Krill Oil 500 MG Caps Take 500 mg by mouth in the morning.    LORazepam  0.5 MG tablet Commonly known as: ATIVAN  Take  0.5-1 tablets (0.25-0.5 mg total) by mouth 2 (two) times daily as needed for anxiety.    metFORMIN  1000 MG tablet Commonly known as: GLUCOPHAGE  Take 1 tablet (1,000 mg total) by mouth 2 (two) times daily with a meal.    multivitamin with minerals Tabs tablet Take 1 tablet by mouth in the morning.    nitrofurantoin  (macrocrystal-monohydrate) 100 MG capsule Commonly known as: MACROBID  Take 100 mg by mouth 2 (two) times daily.    ondansetron  4 MG tablet Commonly known as: Zofran  Take 1 tablet (4 mg total) by mouth daily as needed for nausea or vomiting.    onetouch ultrasoft lancets Use once daily as instructed E11.65 (one touch verio)    OVER THE COUNTER MEDICATION Take 1 tablet by mouth daily. Nervive supplement    oxyCODONE -acetaminophen  5-325 MG tablet Commonly known as: Percocet Take 1 tablet by mouth every 4 (four) hours as needed.    rosuvastatin  10 MG tablet Commonly known as: CRESTOR  Take 1 tablet (10 mg total) by mouth daily. What changed: when to take  this    sertraline  100 MG tablet Commonly known as: ZOLOFT  Take 1 tablet (100 mg total) by mouth daily.    tamsulosin  0.4 MG Caps capsule Commonly known as: Flomax  Take 1 capsule (0.4 mg total) by mouth daily after supper.    Trulicity  3 MG/0.5ML Soaj Generic drug: Dulaglutide  Inject 3 mg as directed once a week. What changed: additional instructions             Allergies:  Allergies       Allergies  Allergen Reactions   Enalapril Anaphylaxis      Angioedema Aug 2018        Family History:      Family History  Problem Relation Age of Onset   Non-Hodgkin's lymphoma Mother     Arthritis Mother     Cancer Mother     Heart disease Father     Arthritis Father     COPD Father     Hypertension Father     Diabetes Daughter     Rheum arthritis Daughter     Polycystic ovary syndrome Daughter     Breast cancer Neg Hx            Social History:  reports that she quit smoking about 5 years ago. Her smoking use included cigarettes. She started smoking about 30 years ago. She has a 6.3 pack-year smoking history. She has never used smokeless tobacco. She reports that she does not currently use alcohol. She reports that she does not currently use drugs.   ROS: All other review of systems were reviewed and are negative except what is noted above in HPI   Physical Exam: BP 120/69   Pulse 80   Constitutional:  Alert and oriented, No acute distress. HEENT: [place] AT, moist mucus membranes.  Trachea midline, no masses. Cardiovascular: No clubbing, cyanosis, or edema. Respiratory: Normal respiratory effort, no increased work of breathing. GI: Abdomen is soft, nontender, nondistended, no abdominal masses GU: No CVA tenderness.  Lymph: No cervical or inguinal lymphadenopathy. Skin: No rashes, bruises or suspicious lesions. Neurologic: Grossly intact, no focal deficits, moving all 4 extremities. Psychiatric: Normal mood and affect.   Laboratory Data: Recent Labs       Lab  Results  Component Value Date    WBC 11.5 (H) 10/20/2023    HGB 10.3 (L) 10/20/2023    HCT 30.8 (L) 10/20/2023    MCV 78.2 (L) 10/20/2023  PLT 179 10/20/2023        Recent Labs       Lab Results  Component Value Date    CREATININE 1.67 (H) 10/21/2023        Recent Labs  No results found for: PSA     Recent Labs  No results found for: TESTOSTERONE     Recent Labs       Lab Results  Component Value Date    HGBA1C 6.7 (H) 05/21/2023        Urinalysis Labs (Brief)          Component Value Date/Time    APPEARANCEUR Cloudy (A) 07/10/2023 0853    GLUCOSEU 3+ (A) 07/10/2023 0853    BILIRUBINUR Negative 07/10/2023 0853    PROTEINUR 1+ (A) 07/10/2023 0853    NITRITE Positive (A) 07/10/2023 0853    LEUKOCYTESUR 2+ (A) 07/10/2023 0853        Recent Labs       Lab Results  Component Value Date    LABMICR See below: 07/10/2023    WBCUA >30 (A) 07/10/2023    LABEPIT 0-10 07/10/2023    BACTERIA Many (A) 07/10/2023        Pertinent Imaging: CT 10/20/2023: Images reviewed and discussed with the patient  Results for orders placed during the hospital encounter of 10/08/23   DG Abd 1 View   Narrative CLINICAL DATA:  Nephrolithiasis   EXAM: ABDOMEN - 1 VIEW   COMPARISON:  Renal ultrasound 09/02/2023   FINDINGS: A cluster of calcifications in the left mid kidney measures proximally 1.4 by 1.1 cm. This likely represents at least two stones in close proximity.   In the right mid to lower kidney, a large 2.6 by 2.0 cm stone is observed. Just below this projecting over the right kidney lower pole is a 0.9 by 1.0 cm stone.   There is calcification projecting over the left sacroiliac joint and some hazy additional calcifications along the sacrum. On review of the CT scan from 05/28/2023, there is a 1.4 cm partially calcified central mesenteric lesion which could represent a partially calcified central mesenteric lymph node or an aneurysm of the ileocolic  artery. There is also a 1.5 cm densely calcified lesion along the wall of adjacent small bowel on image 51 series 2. Small bowel carcinoid with adjacent calcified lymph node not excluded.   IMPRESSION: 1. Bilateral nephrolithiasis. 2. On review of the CT scan from 05/28/2023, there is a 1.4 cm partially calcified central mesenteric lesion which could represent a partially calcified central mesenteric lymph node or an aneurysm of the ileocolic artery. There is also a 1.5 cm densely calcified lesion along the wall of adjacent small bowel. Small bowel carcinoid with adjacent calcified lymph node not excluded. Consider gastroenterology referral for further workup.     Electronically Signed By: Ryan Salvage M.D. On: 10/08/2023 10:22   No results found for this or any previous visit.   No results found for this or any previous visit.   No results found for this or any previous visit.   Results for orders placed during the hospital encounter of 09/02/23   US  RENAL   Narrative CLINICAL DATA:  Nephrolithiasis follow-up.   EXAM: RENAL / URINARY TRACT ULTRASOUND COMPLETE   COMPARISON:  CT renal stone protocol May 28, 2023   FINDINGS: Right Kidney:   Renal measurements: 10.9 x 5.4 x 5.6 cm = volume: 170.4 mL. 1.7 cm nonobstructing stone identified in the midpole right kidney/renal pelvis.  Echogenicity within normal limits. No mass or hydronephrosis visualized.   Left Kidney:   Renal measurements: 9.7 x 5.4 x 5 cm = volume: 136 mL. 1.4 cm nonobstructing stone identified in midpole left kidney. echogenicity within normal limits. No mass or hydronephrosis visualized.   Bladder:   Appears normal for degree of bladder distention.   Other:   None.   IMPRESSION: Bilateral nonobstructing renal stones. No hydronephrosis bilaterally.     Electronically Signed By: Craig Farr M.D. On: 09/02/2023 15:32   No results found for this or any previous visit.   No  results found for this or any previous visit.   Results for orders placed during the hospital encounter of 10/20/23   CT Renal Stone Study   Narrative CLINICAL DATA:  68 year old female with abdomen and flank pain. History of staghorn calculi, status post recent lithotripsy. Previous left side nephrostomy.   EXAM: CT ABDOMEN AND PELVIS WITHOUT CONTRAST   TECHNIQUE: Multidetector CT imaging of the abdomen and pelvis was performed following the standard protocol without IV contrast.   RADIATION DOSE REDUCTION: This exam was performed according to the departmental dose-optimization program which includes automated exposure control, adjustment of the mA and/or kV according to patient size and/or use of iterative reconstruction technique.   COMPARISON:  CT Abdomen and Pelvis 05/28/2023 and earlier.   FINDINGS: Lower chest: Streaky and platelike opacity now at both lung bases, more resembles atelectasis than infection. No pleural effusion. Calcified coronary artery atherosclerosis. Calcified aortic atherosclerosis. No pericardial effusion or cardiomegaly.   Hepatobiliary: Stable and negative noncontrast appearance.   Pancreas: Negative.   Spleen: Negative.   Adrenals/Urinary Tract: Adrenal glands are stable and within normal limits. Left nephrostomy and ureteral stent have been removed since February. Regressed but not resolved left nephrolithiasis since that time, residual calcifications in the left kidney up to 8 mm. No left hydronephrosis or hydroureter.   Contralateral right nephromegaly, moderate to severe right hydronephrosis and right hydroureter. Regression of the previous staghorn calculus, but right lower pole confluent calcifications encompass up to 11 mm. Dilated and tortuous right ureter to the level of clustered calcifications at the pelvic inlet which are numerous. Confluent, up to 15 mm collection of calcifications as seen on coronal image 58. Distal to that  additional extensive and confluent distal right ureter calcifications best seen on coronal image 63, more than 2 cm in length and about 6-7 mm diameter (series 2, image 66.   No calculi within the bladder which seems to remain normal.   Stomach/Bowel: Redundant sigmoid colon with diverticulosis. No dilated large or small bowel. Decompressed right colon. Cecum on a lax mesentery. Evidence of prior appendectomy.   However, chronic calcification of a lower abdominal small bowel loop which today is on coronal image 42, previously just to the right of midline, and appearance of that loop unchanged from December.   No bowel enlargement or regional inflammation there.   But furthermore, faint nodular calcification along the noncontrast SMA on series 2, image 55 stable.   No pneumoperitoneum. No free fluid. No peritoneal space inflammation is identified.   Vascular/Lymphatic: Chronic severe Aortoiliac calcified atherosclerosis. Vascular patency is not evaluated in the absence of IV contrast.   Reproductive: Stable and negative noncontrast appearance.   Other: No pelvis free fluid.   Musculoskeletal: Advanced chronic lumbar spine degeneration is stable. No acute osseous abnormality identified.   IMPRESSION: 1. Positive for Moderate to Severe Obstructive Uropathy on the Right due to severe Steinstrasse in the  right ureter at and below the pelvic inlet.   2. 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 Recommend follow-up outpatient CT Enterography to evaluate: Abnormal calcification of a distal small bowel loop in the lower abdomen (coronal image 42) and associated suspicious nodular calcification in the nearby small bowel mesentery (coronal image 29).   3. Regressed bilateral staghorn calculi since February but bulky intrarenal calcifications persist.     Electronically Signed By: VEAR Hurst M.D. On:  10/20/2023 12:08     Assessment & Plan:     1. Kidney stones (Primary) -We discussed the management of kidney stones. These options include observation, ureteroscopy, shockwave lithotripsy (ESWL) and percutaneous nephrolithotomy (PCNL). We discussed which options are relevant to the patient's stone(s). We discussed the natural history of kidney stones as well as the complications of untreated stones and the impact on quality of life without treatment as well as with each of the above listed treatments. We also discussed the efficacy of each treatment in its ability to clear the stone burden. With any of these management options I discussed the signs and symptoms of infection and the need for emergent treatment should these be experienced. For each option we discussed the ability of each procedure to clear the patient of their stone burden.   For observation I described the risks which include but are not limited to silent renal damage, life-threatening infection, need for emergent surgery, failure to pass stone and pain.   For ureteroscopy I described the risks which include bleeding, infection, damage to contiguous structures, positioning injury, ureteral stricture, ureteral avulsion, ureteral injury, need for prolonged ureteral stent, inability to perform ureteroscopy, need for an interval procedure, inability to clear stone burden, stent discomfort/pain, heart attack, stroke, pulmonary embolus and the inherent risks with general anesthesia.   For shockwave lithotripsy I described the risks which include arrhythmia, kidney contusion, kidney hemorrhage, need for transfusion, pain, inability to adequately break up stone, inability to pass stone fragments, Steinstrasse, infection associated with obstructing stones, need for alternate surgical procedure, need for repeat shockwave lithotripsy, MI, CVA, PE and the inherent risks with anesthesia/conscious sedation.   For PCNL I described the risks including  positioning injury, pneumothorax, hydrothorax, need for chest tube, inability to clear stone burden, renal laceration, arterial venous fistula or malformation, need for embolization of kidney, loss of kidney or renal function, need for repeat procedure, need for prolonged nephrostomy tube, ureteral avulsion, MI, CVA, PE and the inherent risks of general anesthesia.   - The patient would like to proceed with bilateral ureteroscopic stone extraction - Urinalysis, Routine w reflex microscopic     No follow-ups on file.   [name], MD   [facility] [place]

## 2023-12-26 NOTE — Anesthesia Postprocedure Evaluation (Signed)
 Anesthesia Post Note  Patient: Adrienne Davidson  Procedure(s) Performed: CYSTOSCOPY/URETEROSCOPY/HOLMIUM LASER/STENT PLACEMENT (Bilateral) HOLMIUM LASER APPLICATION (Bilateral: Ureter) CYSTOSCOPY, WITH CALCULUS REMOVAL USING BASKET (Bilateral: Ureter)  Patient location during evaluation: PACU Anesthesia Type: General Level of consciousness: awake and alert Pain management: pain level controlled Vital Signs Assessment: post-procedure vital signs reviewed and stable Respiratory status: spontaneous breathing, nonlabored ventilation, respiratory function stable and patient connected to nasal cannula oxygen Cardiovascular status: blood pressure returned to baseline and stable Postop Assessment: no apparent nausea or vomiting Anesthetic complications: no   There were no known notable events for this encounter.   Last Vitals:  Vitals:   12/26/23 0947 12/26/23 0953  BP: 131/66 139/72  Pulse: 79 76  Resp: 16 11  Temp:    SpO2: 99% 100%    Last Pain:  Vitals:   12/26/23 0953  PainSc: 0-No pain                 Markail Diekman L Mylz Yuan

## 2023-12-26 NOTE — Transfer of Care (Signed)
 Immediate Anesthesia Transfer of Care Note  Patient: Adrienne Davidson  Procedure(s) Performed: CYSTOSCOPY/URETEROSCOPY/HOLMIUM LASER/STENT PLACEMENT (Bilateral) HOLMIUM LASER APPLICATION (Bilateral: Ureter) CYSTOSCOPY, WITH CALCULUS REMOVAL USING BASKET (Bilateral: Ureter)  Patient Location: PACU  Anesthesia Type:General  Level of Consciousness: awake, alert , patient cooperative, and responds to stimulation  Airway & Oxygen Therapy: Patient Spontanous Breathing  Post-op Assessment: Report given to RN and Post -op Vital signs reviewed and stable  Post vital signs: Reviewed and stable  Last Vitals:  Vitals Value Taken Time  BP 135/59 12/26/23 09:21  Temp    Pulse 79 12/26/23 09:24  Resp 16 12/26/23 09:24  SpO2 97 % 12/26/23 09:24  Vitals shown include unfiled device data.  Last Pain:  Vitals:   12/26/23 0644  PainSc: 0-No pain         Complications: No notable events documented.

## 2023-12-27 ENCOUNTER — Encounter (HOSPITAL_COMMUNITY): Payer: Self-pay | Admitting: Urology

## 2023-12-30 ENCOUNTER — Ambulatory Visit: Admitting: Urology

## 2024-01-03 ENCOUNTER — Other Ambulatory Visit (INDEPENDENT_AMBULATORY_CARE_PROVIDER_SITE_OTHER)

## 2024-01-03 DIAGNOSIS — E119 Type 2 diabetes mellitus without complications: Secondary | ICD-10-CM

## 2024-01-03 DIAGNOSIS — Z7985 Long-term (current) use of injectable non-insulin antidiabetic drugs: Secondary | ICD-10-CM

## 2024-01-03 NOTE — Progress Notes (Signed)
 12/13/2023 Name: Adrienne Davidson         MRN: [id number]       DOB: November 13, 1955       Chief Complaint  Patient presents with   Medication Management      Diabetes;med assistance     Subjective:   Met with patient to discuss chronic conditions (T2DM, HLD, HTN) and medication adherence.   Diabetes:   Current medications:  Trulicity  (dulaglutide ) 3mg  weekly Farxiga  (dapagliflozin ) 5mg  daily Metformin  1G twice daily Medications tried in the past:  Ozempic  (semaglutide )-cost Victoza  (liraglutide ) Januvia (sitagliptin) 25mg  daily   Hyperlipidemia Rosuvastatin  10mg  evenings (refills sent today) LF 09/17/23 for #90 [id number] Past meds: gemfibrozil  (stopped 2023)   Clinical ASCVD: No  The 10-year ASCVD risk score (Arnett DK, et al., 2019) is: 18.4%   Values used to calculate the score:     Age: 68 years     Clincally relevant sex: Female     Is Non-Hispanic African American: No     Diabetic: Yes     Tobacco smoker: No     Systolic Blood Pressure: 122 mmHg     Is BP treated: Yes     HDL Cholesterol: 65 mg/dL     Total Cholesterol: 136 mg/dL    Objective:   Recent Labs       Lab Results  Component Value Date    HGBA1C 6.7 (H) 05/21/2023      Recent Labs       Lab Results  Component Value Date    CHOL 136 06/13/2022    HDL 65 06/13/2022    LDLCALC 51 06/13/2022    LDLDIRECT 116 (H) 10/23/2019    TRIG 109 06/13/2022    CHOLHDL 2.1 06/13/2022        Medications Reviewed Today       Reviewed by Billee Mliss BIRCH, [facility] (Pharmacist) on 12/13/23 at [phone]  Med List Status: <None>    Medication Order Taking? Sig Documenting Provider Last Dose Status Informant  acetaminophen  (TYLENOL ) 500 MG tablet [id number]   Take 500-1,000 mg by mouth every 6 (six) hours as needed (pain.). [provider]   Active Self  amLODipine  (NORVASC ) 5 MG tablet [id number] No Take 1 tablet (5 mg total) by mouth daily.  Patient taking differently: Take 5 mg by mouth every evening.    Jolinda Potter M, DO 11/13/2023 Active Self  Cholecalciferol (D-3-5) 125 MCG (5000 UT) capsule [id number] No Take 5,000 Units by mouth in the morning. [provider] 11/13/2023 Active Self  Coenzyme Q10 200 MG capsule [id number] No Take 200 mg by mouth at bedtime. [provider] 11/13/2023 Active Self  CRANBERRY PO [id number] No Take 500 mg by mouth at bedtime. [provider] 11/13/2023 Active Self  Dulaglutide  (TRULICITY ) 3 MG/0.5ML SOPN [id number] No Inject 3 mg as directed once a week. Jolinda Potter M, DO 11/04/2023 Active Self                   Med Note JERALYN, KYLE A   Thu Nov 07, 2023  2:49 PM) Mondays  EPINEPHrine  0.3 mg/0.3 mL IJ SOAJ injection [id number]   Inject 0.3 mg into the muscle as needed for anaphylaxis. Then call 911 Jolinda Potter M, [place]   Active Self  FARXIGA  5 MG TABS tablet [id number] No Take 1 tablet (5 mg total) by mouth daily. Jolinda Potter HERO, DO 11/09/2023 Active Self  Med Note (WARD, ANGELICA G   Sun Oct 20, 2023 12:09 PM)    glucose blood test strip [id number]   Test blood sugar once daily (one touch verio if covered by ins/ otherwise whatever ins preference is) E11.65 [name], DO   Active Self  Krill Oil 500 MG CAPS [id number] No Take 500 mg by mouth in the morning. [provider] 11/13/2023 Active Self  Lancets JANETT ULTRASOFT) lancets [id number]   Use once daily as instructed E11.65 (one touch verio) [name], DO   Active Self  LORazepam  (ATIVAN ) 0.5 MG tablet [id number]   Take 0.5-1 tablets (0.25-0.5 mg total) by mouth 2 (two) times daily as needed for anxiety. Jolinda Norene M, DO   Active Self  metFORMIN  (GLUCOPHAGE ) 1000 MG tablet [id number] No Take 1 tablet (1,000 mg total) by mouth 2 (two) times daily with a meal. [name], DO 11/13/2023 Active Self  Multiple Vitamin (MULTIVITAMIN WITH MINERALS) TABS tablet [id number] No Take 1 tablet by mouth in the morning. [provider] 11/13/2023 Active Self  OVER THE COUNTER MEDICATION [id number]   Take 1 tablet by mouth at bedtime. Nervive supplement [provider]   Active Self  oxyCODONE -acetaminophen  (PERCOCET) 5-325 MG tablet [id number]   Take 1 tablet by mouth every 4 (four) hours as needed. McKenzie, Belvie CROME, MD   Active    rosuvastatin  (CRESTOR ) 10 MG tablet [id number] No Take 1 tablet (10 mg total) by mouth daily.  Patient taking differently: Take 10 mg by mouth every evening.   Jolinda Norene M, DO 11/13/2023 Active Self  sertraline  (ZOLOFT ) 100 MG tablet [id number] No Take 1 tablet (100 mg total) by mouth daily. Jolinda Norene M, DO 11/13/2023 Active Self                 A/P: Assisted patient with review of patient assistance programs (over income) A1c at goal--continue current medication Encouraged patient to talk with insurance agent to guide next year's insurance plan Will await samples for patient and leave up front when they arrive Compliant with statin ACEi allergy; continue SGLT2 F/u PCP this month    Mliss Tarry Griffin, PharmD, BCACP, CPP Clinical Pharmacist, [facility]

## 2024-01-06 ENCOUNTER — Telehealth: Payer: Self-pay | Admitting: Pharmacist

## 2024-01-06 LAB — STONE ANALYSIS
Calcium Oxalate Dihydrate: 20 %
Calcium Oxalate Monohydrate: 80 %
Weight Calculi: 204 mg

## 2024-01-06 NOTE — Telephone Encounter (Signed)
   Trulicity  and Farxiga  samples left for patient up front to pick up.  Patient and spouse are over the income limit for patient assistance programs.  Reviewed quality data--patient has ACEi allergy; stable on SGLT2 and she recently filled rosuvastatin  100 day supply Patient verbalizes understanding.  Brown Dunlap Dattero Winslow Ederer, PharmD, BCACP, CPP Clinical Pharmacist, [facility]

## 2024-01-08 ENCOUNTER — Ambulatory Visit: Admitting: Urology

## 2024-01-08 ENCOUNTER — Encounter: Payer: Self-pay | Admitting: Urology

## 2024-01-08 VITALS — BP 130/67 | HR 87

## 2024-01-08 DIAGNOSIS — N200 Calculus of kidney: Secondary | ICD-10-CM

## 2024-01-08 DIAGNOSIS — K6389 Other specified diseases of intestine: Secondary | ICD-10-CM

## 2024-01-08 DIAGNOSIS — N3000 Acute cystitis without hematuria: Secondary | ICD-10-CM | POA: Diagnosis not present

## 2024-01-08 LAB — URINALYSIS, ROUTINE W REFLEX MICROSCOPIC
Bilirubin, UA: NEGATIVE
Ketones, UA: NEGATIVE
Nitrite, UA: NEGATIVE
RBC, UA: NEGATIVE
Specific Gravity, UA: 1.01 (ref 1.005–1.030)
Urobilinogen, Ur: 0.2 mg/dL (ref 0.2–1.0)
pH, UA: 6 (ref 5.0–7.5)

## 2024-01-08 LAB — MICROSCOPIC EXAMINATION

## 2024-01-08 NOTE — Progress Notes (Signed)
 01/08/2024 10:19 AM   Adrienne Davidson 1955-12-18 [id number]  Referring provider: [name], DO [address] [place],  [place] [address]  Followup nephrolithiasis   HPI: Adrienne Davidson is a 68yo here for followup for nephrolithiasis. She removed her stent POD#3 without issues. Her LUTS significantly improved after stent removal. No flank pain. Stone composition CaOx. She is drinking more water  and adding lemonade.    PMH: Past Medical History:  Diagnosis Date   Allergic rhinitis    Allergy    Anxiety    Arthritis    Diabetes mellitus (HCC)    Hyperlipidemia    Hypertension    Psoriasis    Bilateral feet   Sleep apnea    Sleep apnea    Vitamin D  deficiency     Surgical History: Past Surgical History:  Procedure Laterality Date   APPENDECTOMY     CESAREAN SECTION  1980   CESAREAN SECTION  1984   CYSTOSCOPY W/ URETERAL STENT PLACEMENT Bilateral 10/20/2023   Procedure: CYSTOSCOPY, WITH RETROGRADE PYELOGRAM AND URETERAL STENT INSERTION;  Surgeon: Alvaro Ricardo KATHEE Mickey., MD;  Location: [facility];  Service: Urology;  Laterality: Bilateral;   CYSTOSCOPY/RETROGRADE/URETEROSCOPY/STONE EXTRACTION WITH BASKET Right 11/14/2023   Procedure: CYSTOSCOPY, WITH CALCULUS REMOVAL USING BASKET;  Surgeon: Sherrilee Belvie CROME, MD;  Location: [facility];  Service: Urology;  Laterality: Right;   CYSTOSCOPY/RETROGRADE/URETEROSCOPY/STONE EXTRACTION WITH BASKET Bilateral 12/26/2023   Procedure: CYSTOSCOPY, WITH CALCULUS REMOVAL USING BASKET;  Surgeon: Sherrilee Belvie CROME, MD;  Location: [facility];  Service: Urology;  Laterality: Bilateral;   CYSTOSCOPY/URETEROSCOPY/HOLMIUM LASER/STENT PLACEMENT Right 11/14/2023   Procedure: CYSTOSCOPY/URETEROSCOPY/HOLMIUM LASER/STENT PLACEMENT;  Surgeon: Sherrilee Belvie CROME, MD;  Location: [facility];  Service: Urology;  Laterality: Right;  pt to arrive at 8:30   CYSTOSCOPY/URETEROSCOPY/HOLMIUM LASER/STENT PLACEMENT Bilateral 12/26/2023   Procedure: CYSTOSCOPY/URETEROSCOPY/HOLMIUM  LASER/STENT PLACEMENT;  Surgeon: Sherrilee Belvie CROME, MD;  Location: [facility];  Service: Urology;  Laterality: Bilateral;  stent exchange   EXTRACORPOREAL SHOCK WAVE LITHOTRIPSY Right 10/08/2023   Procedure: LITHOTRIPSY, ESWL;  Surgeon: Sherrilee Belvie CROME, MD;  Location: [facility];  Service: Urology;  Laterality: Right;   HOLMIUM LASER APPLICATION Right 11/14/2023   Procedure: HOLMIUM LASER APPLICATION;  Surgeon: Sherrilee Belvie CROME, MD;  Location: [facility];  Service: Urology;  Laterality: Right;   HOLMIUM LASER APPLICATION Bilateral 12/26/2023   Procedure: HOLMIUM LASER APPLICATION;  Surgeon: Sherrilee Belvie CROME, MD;  Location: [facility];  Service: Urology;  Laterality: Bilateral;   IR URETERAL STENT RIGHT NEW ACCESS W/O SEP NEPHROSTOMY CATH  05/22/2023   NEPHROLITHOTOMY Left 05/23/2023   Procedure: NEPHROLITHOTOMY PERCUTANEOUS;  Surgeon: Sherrilee Belvie CROME, MD;  Location: [facility];  Service: Urology;  Laterality: Left;   NEPHROLITHOTOMY Left 06/24/2023   Procedure: NEPHROLITHOTOMY PERCUTANEOUS- second look;  Surgeon: Sherrilee Belvie CROME, MD;  Location: [facility];  Service: Urology;  Laterality: Left;   TONSILLECTOMY      Home Medications:  Allergies as of 01/08/2024       Reactions   Enalapril Anaphylaxis   Angioedema Aug 2018        Medication List        Accurate as of January 08, 2024 10:19 AM. If you have any questions, ask your nurse or doctor.          acetaminophen  500 MG tablet Commonly known as: TYLENOL  Take 500-1,000 mg by mouth every 6 (six) hours as needed (pain.).   amLODipine  5 MG tablet Commonly known as: NORVASC  Take 1 tablet (5 mg total)  by mouth daily. What changed: when to take this   Coenzyme Q10 100 MG Tabs Take 100 mg by mouth at bedtime.   CRANBERRY PO Take 25,200 mg by mouth at bedtime.   EPINEPHrine  0.3 mg/0.3 mL Soaj injection Commonly known as: EPI-PEN Inject 0.3 mg into the muscle as needed for anaphylaxis. Then call 911   Farxiga  5 MG Tabs  tablet Generic drug: dapagliflozin  propanediol Take 1 tablet (5 mg total) by mouth daily.   glucose blood test strip Test blood sugar once daily (one touch verio if covered by ins/ otherwise whatever ins preference is) E11.65   LORazepam  0.5 MG tablet Commonly known as: ATIVAN  Take 0.5-1 tablets (0.25-0.5 mg total) by mouth 2 (two) times daily as needed for anxiety.   metFORMIN  1000 MG tablet Commonly known as: GLUCOPHAGE  Take 1 tablet (1,000 mg total) by mouth 2 (two) times daily with a meal.   multivitamin with minerals Tabs tablet Take 1 tablet by mouth in the morning.   Omega-3 Fish Oil 1200 MG Caps Take 1,200 mg by mouth daily.   onetouch ultrasoft lancets Use once daily as instructed E11.65 (one touch verio)   OVER THE COUNTER MEDICATION Take 1 tablet by mouth at bedtime. Nervive supplement   rosuvastatin  10 MG tablet Commonly known as: CRESTOR  Take 1 tablet (10 mg total) by mouth daily. What changed: when to take this   sertraline  100 MG tablet Commonly known as: ZOLOFT  Take 1 tablet (100 mg total) by mouth daily.   Trulicity  3 MG/0.5ML Soaj Generic drug: Dulaglutide  Inject 3 mg as directed once a week.   Vitamin D3 250 MCG (10000 UT) capsule Take 10,000 Units by mouth daily.        Allergies:  Allergies  Allergen Reactions   Enalapril Anaphylaxis    Angioedema Aug 2018    Family History: Family History  Problem Relation Age of Onset   Non-Hodgkin's lymphoma Mother    Arthritis Mother    Cancer Mother    Heart disease Father    Arthritis Father    COPD Father    Hypertension Father    Diabetes Daughter    Rheum arthritis Daughter    Polycystic ovary syndrome Daughter    Breast cancer Neg Hx     Social History:  reports that she quit smoking about 5 years ago. Her smoking use included cigarettes. She started smoking about 30 years ago. She has a 6.3 pack-year smoking history. She has never used smokeless tobacco. She reports that she does  not currently use alcohol. She reports that she does not currently use drugs.  ROS: All other review of systems were reviewed and are negative except what is noted above in HPI  Physical Exam: BP 130/67   Pulse 87   Constitutional:  Alert and oriented, No acute distress. HEENT:  AT, moist mucus membranes.  Trachea midline, no masses. Cardiovascular: No clubbing, cyanosis, or edema. Respiratory: Normal respiratory effort, no increased work of breathing. GI: Abdomen is soft, nontender, nondistended, no abdominal masses GU: No CVA tenderness.  Lymph: No cervical or inguinal lymphadenopathy. Skin: No rashes, bruises or suspicious lesions. Neurologic: Grossly intact, no focal deficits, moving all 4 extremities. Psychiatric: Normal mood and affect.  Laboratory Data: Lab Results  Component Value Date   WBC 11.5 (H) 10/20/2023   HGB 10.3 (L) 10/20/2023   HCT 30.8 (L) 10/20/2023   MCV 78.2 (L) 10/20/2023   PLT 179 10/20/2023    Lab Results  Component Value Date  CREATININE 1.67 (H) 10/21/2023    No results found for: PSA  No results found for: TESTOSTERONE  Lab Results  Component Value Date   HGBA1C 6.7 (H) 05/21/2023    Urinalysis    Component Value Date/Time   APPEARANCEUR Cloudy (A) 12/18/2023 1334   GLUCOSEU 3+ (A) 12/18/2023 1334   BILIRUBINUR Negative 12/18/2023 1334   PROTEINUR 3+ (A) 12/18/2023 1334   NITRITE Positive (A) 12/18/2023 1334   LEUKOCYTESUR 2+ (A) 12/18/2023 1334    Lab Results  Component Value Date   LABMICR See below: 12/18/2023   WBCUA 11-30 (A) 12/18/2023   LABEPIT 0-10 12/18/2023   BACTERIA Many (A) 12/18/2023    Pertinent Imaging: *** Results for orders placed during the hospital encounter of 10/08/23  DG Abd 1 View  Narrative CLINICAL DATA:  Nephrolithiasis  EXAM: ABDOMEN - 1 VIEW  COMPARISON:  Renal ultrasound 09/02/2023  FINDINGS: A cluster of calcifications in the left mid kidney measures proximally 1.4 by 1.1  cm. This likely represents at least two stones in close proximity.  In the right mid to lower kidney, a large 2.6 by 2.0 cm stone is observed. Just below this projecting over the right kidney lower pole is a 0.9 by 1.0 cm stone.  There is calcification projecting over the left sacroiliac joint and some hazy additional calcifications along the sacrum. On review of the CT scan from 05/28/2023, there is a 1.4 cm partially calcified central mesenteric lesion which could represent a partially calcified central mesenteric lymph node or an aneurysm of the ileocolic artery. There is also a 1.5 cm densely calcified lesion along the wall of adjacent small bowel on image 51 series 2. Small bowel carcinoid with adjacent calcified lymph node not excluded.  IMPRESSION: 1. Bilateral nephrolithiasis. 2. On review of the CT scan from 05/28/2023, there is a 1.4 cm partially calcified central mesenteric lesion which could represent a partially calcified central mesenteric lymph node or an aneurysm of the ileocolic artery. There is also a 1.5 cm densely calcified lesion along the wall of adjacent small bowel. Small bowel carcinoid with adjacent calcified lymph node not excluded. Consider gastroenterology referral for further workup.   Electronically Signed By: Ryan Salvage M.D. On: 10/08/2023 10:22  No results found for this or any previous visit.  No results found for this or any previous visit.  No results found for this or any previous visit.  Results for orders placed during the hospital encounter of 09/02/23  US  RENAL  Narrative CLINICAL DATA:  Nephrolithiasis follow-up.  EXAM: RENAL / URINARY TRACT ULTRASOUND COMPLETE  COMPARISON:  CT renal stone protocol May 28, 2023  FINDINGS: Right Kidney:  Renal measurements: 10.9 x 5.4 x 5.6 cm = volume: 170.4 mL. 1.7 cm nonobstructing stone identified in the midpole right kidney/renal pelvis. Echogenicity within normal limits.  No mass or hydronephrosis visualized.  Left Kidney:  Renal measurements: 9.7 x 5.4 x 5 cm = volume: 136 mL. 1.4 cm nonobstructing stone identified in midpole left kidney. echogenicity within normal limits. No mass or hydronephrosis visualized.  Bladder:  Appears normal for degree of bladder distention.  Other:  None.  IMPRESSION: Bilateral nonobstructing renal stones. No hydronephrosis bilaterally.   Electronically Signed By: Craig Farr M.D. On: 09/02/2023 15:32  No results found for this or any previous visit.  No results found for this or any previous visit.  Results for orders placed during the hospital encounter of 10/20/23  CT Renal Stone Study  Narrative CLINICAL DATA:  68 year old female with abdomen and flank pain. History of staghorn calculi, status post recent lithotripsy. Previous left side nephrostomy.  EXAM: CT ABDOMEN AND PELVIS WITHOUT CONTRAST  TECHNIQUE: Multidetector CT imaging of the abdomen and pelvis was performed following the standard protocol without IV contrast.  RADIATION DOSE REDUCTION: This exam was performed according to the departmental dose-optimization program which includes automated exposure control, adjustment of the mA and/or kV according to patient size and/or use of iterative reconstruction technique.  COMPARISON:  CT Abdomen and Pelvis 05/28/2023 and earlier.  FINDINGS: Lower chest: Streaky and platelike opacity now at both lung bases, more resembles atelectasis than infection. No pleural effusion. Calcified coronary artery atherosclerosis. Calcified aortic atherosclerosis. No pericardial effusion or cardiomegaly.  Hepatobiliary: Stable and negative noncontrast appearance.  Pancreas: Negative.  Spleen: Negative.  Adrenals/Urinary Tract: Adrenal glands are stable and within normal limits. Left nephrostomy and ureteral stent have been removed since February. Regressed but not resolved left nephrolithiasis since  that time, residual calcifications in the left kidney up to 8 mm. No left hydronephrosis or hydroureter.  Contralateral right nephromegaly, moderate to severe right hydronephrosis and right hydroureter. Regression of the previous staghorn calculus, but right lower pole confluent calcifications encompass up to 11 mm. Dilated and tortuous right ureter to the level of clustered calcifications at the pelvic inlet which are numerous. Confluent, up to 15 mm collection of calcifications as seen on coronal image 58. Distal to that additional extensive and confluent distal right ureter calcifications best seen on coronal image 63, more than 2 cm in length and about 6-7 mm diameter (series 2, image 66.  No calculi within the bladder which seems to remain normal.  Stomach/Bowel: Redundant sigmoid colon with diverticulosis. No dilated large or small bowel. Decompressed right colon. Cecum on a lax mesentery. Evidence of prior appendectomy.  However, chronic calcification of a lower abdominal small bowel loop which today is on coronal image 42, previously just to the right of midline, and appearance of that loop unchanged from December.  No bowel enlargement or regional inflammation there.  But furthermore, faint nodular calcification along the noncontrast SMA on series 2, image 55 stable.  No pneumoperitoneum. No free fluid. No peritoneal space inflammation is identified.  Vascular/Lymphatic: Chronic severe Aortoiliac calcified atherosclerosis. Vascular patency is not evaluated in the absence of IV contrast.  Reproductive: Stable and negative noncontrast appearance.  Other: No pelvis free fluid.  Musculoskeletal: Advanced chronic lumbar spine degeneration is stable. No acute osseous abnormality identified.  IMPRESSION: 1. Positive for Moderate to Severe Obstructive Uropathy on the Right due to severe Steinstrasse in the right ureter at and below the pelvic inlet.  2. Constellation  of incidental small bowel and mesentery finding finding suspicious for Small Bowel Carcinoid Tumor. Recommend follow-up outpatient CT Enterography to evaluate: Abnormal calcification of a distal small bowel loop in the lower abdomen (coronal image 42) and associated suspicious nodular calcification in the nearby small bowel mesentery (coronal image 29).  3. Regressed bilateral staghorn calculi since February but bulky intrarenal calcifications persist.   Electronically Signed By: VEAR Hurst M.D. On: 10/20/2023 12:08   Assessment & Plan:    1. Renal calculi (Primary) Followup 3 months with renal US  -dietary handout given - Urinalysis, Routine w reflex microscopic  2. Acute cystitis -urine for culture   No follow-ups on file.  [name], MD  [facility] [place]

## 2024-01-08 NOTE — Patient Instructions (Signed)

## 2024-01-09 ENCOUNTER — Encounter: Payer: Self-pay | Admitting: Gastroenterology

## 2024-01-10 LAB — URINE CULTURE

## 2024-01-15 ENCOUNTER — Ambulatory Visit: Payer: Self-pay | Admitting: Gastroenterology

## 2024-01-15 ENCOUNTER — Ambulatory Visit: Admitting: Gastroenterology

## 2024-01-15 ENCOUNTER — Encounter: Payer: Self-pay | Admitting: Gastroenterology

## 2024-01-15 ENCOUNTER — Other Ambulatory Visit (INDEPENDENT_AMBULATORY_CARE_PROVIDER_SITE_OTHER)

## 2024-01-15 VITALS — BP 130/66 | HR 84 | Ht 62.5 in | Wt 158.1 lb

## 2024-01-15 DIAGNOSIS — R933 Abnormal findings on diagnostic imaging of other parts of digestive tract: Secondary | ICD-10-CM

## 2024-01-15 DIAGNOSIS — R194 Change in bowel habit: Secondary | ICD-10-CM | POA: Diagnosis not present

## 2024-01-15 DIAGNOSIS — K529 Noninfective gastroenteritis and colitis, unspecified: Secondary | ICD-10-CM

## 2024-01-15 LAB — BUN: BUN: 25 mg/dL — ABNORMAL HIGH (ref 6–23)

## 2024-01-15 LAB — CREATININE, SERUM: Creatinine, Ser: 1.25 mg/dL — ABNORMAL HIGH (ref 0.40–1.20)

## 2024-01-15 NOTE — Progress Notes (Signed)
 Adrienne Davidson Consult Note:  History: Adrienne Davidson 01/15/2024  Referring provider: Belvie Davidson (urology) PCP:  Adrienne Norene HERO, DO  Reason for consult/chief complaint: Small Bowel Mass (Patient had a CT done in June. According to the findings, she was referred to GI for further evaluation.)   Subjective  Prior history:  January 2019 consult with [facility] for fecal incontinence and history of adenomatous colon polyps in 2012 She underwent colonoscopy with that practice in February 2019, though the report (and any pathology reports there may have been) cannot be viewed in this EHR.   Discussed the use of AI scribe software for clinical note transcription with the patient, who gave verbal consent to proceed.  History of Present Illness  Adrienne Davidson  is referred to us  at this time by her urologist Dr. Clara who recently performed a procedure for recurrent renal and ureteral stones.  On a CT urogram abdomen and pelvis from June of this year, there is a reported mesenteric abnormality that is partially calcified and may be an adjacent small bowel abnormality as well.   Keturah describes having about 6 months of tendencies to nonbloody diarrhea and this will occasionally cause small-volume fecal incontinence.  She has also come to feel like her internal hemorrhoids that sometimes slide out may be related to the incontinence as well.  She has received some antibiotics as best she can recall with the periodic kidney stones and UTIs. She does not have abdominal pain nausea vomiting dysphagia or weight loss.  No known family history of GI malignancies.  She has been understandably concerned about the finding on the CT scan and hoping to understand its meaning because she wants to get some health issues resolved in hopes of getting a much-needed back surgery.   ROS:  Review of Systems  Constitutional:  Negative for appetite change and unexpected  weight change.  HENT:  Negative for mouth sores and voice change.   Eyes:  Negative for pain and redness.  Respiratory:  Negative for cough and shortness of breath.   Cardiovascular:  Negative for chest pain and palpitations.  Genitourinary:  Negative for dysuria and hematuria.  Musculoskeletal:  Positive for arthralgias and back pain. Negative for myalgias.  Skin:  Negative for pallor and rash.  Neurological:  Negative for weakness and headaches.  Hematological:  Negative for adenopathy.   No fevers chills or night sweats  Past Medical History: Past Medical History:  Diagnosis Date   Allergic rhinitis    Allergy    Anxiety    Arthritis    Diabetes mellitus (HCC)    Hyperlipidemia    Hypertension    Psoriasis    Bilateral feet   Sleep apnea    Sleep apnea    Vitamin D  deficiency      Past Surgical History: Past Surgical History:  Procedure Laterality Date   APPENDECTOMY     CESAREAN SECTION  1980   CESAREAN SECTION  1984   CYSTOSCOPY W/ URETERAL STENT PLACEMENT Bilateral 10/20/2023   Procedure: CYSTOSCOPY, WITH RETROGRADE PYELOGRAM AND URETERAL STENT INSERTION;  Surgeon: Adrienne Davidson., MD;  Location: [facility];  Service: Urology;  Laterality: Bilateral;   CYSTOSCOPY/RETROGRADE/URETEROSCOPY/STONE EXTRACTION WITH BASKET Right 11/14/2023   Procedure: CYSTOSCOPY, WITH CALCULUS REMOVAL USING BASKET;  Surgeon: Davidson Adrienne CROME, MD;  Location: [facility];  Service: Urology;  Laterality: Right;   CYSTOSCOPY/RETROGRADE/URETEROSCOPY/STONE EXTRACTION WITH BASKET Bilateral 12/26/2023   Procedure: CYSTOSCOPY, WITH CALCULUS REMOVAL USING BASKET;  Surgeon:  McKenzie, Adrienne CROME, MD;  Location: [facility];  Service: Urology;  Laterality: Bilateral;   CYSTOSCOPY/URETEROSCOPY/HOLMIUM LASER/STENT PLACEMENT Right 11/14/2023   Procedure: CYSTOSCOPY/URETEROSCOPY/HOLMIUM LASER/STENT PLACEMENT;  Surgeon: Adrienne Adrienne CROME, MD;  Location: [facility];  Service: Urology;  Laterality: Right;  pt to arrive at  8:30   CYSTOSCOPY/URETEROSCOPY/HOLMIUM LASER/STENT PLACEMENT Bilateral 12/26/2023   Procedure: CYSTOSCOPY/URETEROSCOPY/HOLMIUM LASER/STENT PLACEMENT;  Surgeon: Adrienne Adrienne CROME, MD;  Location: [facility];  Service: Urology;  Laterality: Bilateral;  stent exchange   EXTRACORPOREAL SHOCK WAVE LITHOTRIPSY Right 10/08/2023   Procedure: LITHOTRIPSY, ESWL;  Surgeon: Adrienne Adrienne CROME, MD;  Location: [facility];  Service: Urology;  Laterality: Right;   HOLMIUM LASER APPLICATION Right 11/14/2023   Procedure: HOLMIUM LASER APPLICATION;  Surgeon: Adrienne Adrienne CROME, MD;  Location: [facility];  Service: Urology;  Laterality: Right;   HOLMIUM LASER APPLICATION Bilateral 12/26/2023   Procedure: HOLMIUM LASER APPLICATION;  Surgeon: Adrienne Adrienne CROME, MD;  Location: [facility];  Service: Urology;  Laterality: Bilateral;   IR URETERAL STENT RIGHT NEW ACCESS W/O SEP NEPHROSTOMY CATH  05/22/2023   NEPHROLITHOTOMY Left 05/23/2023   Procedure: NEPHROLITHOTOMY PERCUTANEOUS;  Surgeon: Adrienne Adrienne CROME, MD;  Location: [facility];  Service: Urology;  Laterality: Left;   NEPHROLITHOTOMY Left 06/24/2023   Procedure: NEPHROLITHOTOMY PERCUTANEOUS- second look;  Surgeon: Adrienne Adrienne CROME, MD;  Location: [facility];  Service: Urology;  Laterality: Left;   TONSILLECTOMY       Family History: Family History  Problem Relation Age of Onset   Non-Hodgkin's lymphoma Mother    Arthritis Mother    Cancer Mother    Heart disease Father    Arthritis Father    COPD Father    Hypertension Father    Diabetes Daughter    Rheum arthritis Daughter    Polycystic ovary syndrome Daughter    Breast cancer Neg Hx     Social History: Social History   Socioeconomic History   Marital status: Married    Spouse name: Adrienne Davidson   Number of children: 2   Years of education: 12   Highest education level: Associate degree: occupational, Scientist, product/process development, or vocational program  Occupational History   Occupation: paralegal    Comment: retired  Tobacco Use    Smoking status: Former    Current packs/day: 0.00    Average packs/day: 0.3 packs/day for 25.0 years (6.3 ttl pk-yrs)    Types: Cigarettes    Start date: 06/26/1993    Quit date: 06/27/2018    Years since quitting: 5.5   Smokeless tobacco: Never  Vaping Use   Vaping status: Never Used  Substance and Sexual Activity   Alcohol use: Not Currently    Comment: wine once per year   Drug use: Not Currently   Sexual activity: Not Currently    Birth control/protection: None  Other Topics Concern   Not on file  Social History Narrative   Not on file   Social Drivers of Health   Financial Resource Strain: Low Risk  (09/17/2022)   Overall Financial Resource Strain (CARDIA)    Difficulty of Paying Living Expenses: Not very hard  Food Insecurity: No Food Insecurity (10/20/2023)   Hunger Vital Sign    Worried About Running Out of Food in the Last Year: Never true    Ran Out of Food in the Last Year: Never true  Transportation Needs: No Transportation Needs (10/20/2023)   PRAPARE - Administrator, Civil Service (Medical): No    Lack of Transportation (Non-Medical): No  Physical  Activity: Insufficiently Active (09/17/2022)   Exercise Vital Sign    Days of Exercise per Week: 3 days    Minutes of Exercise per Session: 40 min  Stress: No Stress Concern Present (09/17/2022)   [employer] of Occupational Health - Occupational Stress Questionnaire    Feeling of Stress : Only a little  Social Connections: Moderately Integrated (10/20/2023)   Social Connection and Isolation Panel    Frequency of Communication with Friends and Family: More than three times a week    Frequency of Social Gatherings with Friends and Family: Once a week    Attends Religious Services: 1 to 4 times per year    Active Member of [employer] or Organizations: No    Attends Banker Meetings: Never    Marital Status: Married    Allergies: Allergies  Allergen Reactions   Enalapril Anaphylaxis     Angioedema Aug 2018    Outpatient Meds: Current Outpatient Medications  Medication Sig Dispense Refill   acetaminophen  (TYLENOL ) 500 MG tablet Take 500-1,000 mg by mouth every 6 (six) hours as needed (pain.).     amLODipine  (NORVASC ) 5 MG tablet Take 1 tablet (5 mg total) by mouth daily. (Patient taking differently: Take 5 mg by mouth at bedtime.) 100 tablet 3   Cholecalciferol (VITAMIN D3) 250 MCG (10000 UT) capsule Take 10,000 Units by mouth daily.     Coenzyme Q10 100 MG TABS Take 100 mg by mouth at bedtime.     CRANBERRY PO Take 25,200 mg by mouth at bedtime.     Dulaglutide  (TRULICITY ) 3 MG/0.5ML SOPN Inject 3 mg as directed once a week. 6 mL 3   EPINEPHrine  0.3 mg/0.3 mL IJ SOAJ injection Inject 0.3 mg into the muscle as needed for anaphylaxis. Then call 911 2 each 0   FARXIGA  5 MG TABS tablet Take 1 tablet (5 mg total) by mouth daily. 90 tablet 1   glucose blood test strip Test blood sugar once daily (one touch verio if covered by ins/ otherwise whatever ins preference is) E11.65 100 each 12   Lancets (ONETOUCH ULTRASOFT) lancets Use once daily as instructed E11.65 (one touch verio) 100 each 12   LORazepam  (ATIVAN ) 0.5 MG tablet Take 0.5-1 tablets (0.25-0.5 mg total) by mouth 2 (two) times daily as needed for anxiety. 10 tablet 0   metFORMIN  (GLUCOPHAGE ) 1000 MG tablet Take 1 tablet (1,000 mg total) by mouth 2 (two) times daily with a meal. 200 tablet 3   Multiple Vitamin (MULTIVITAMIN WITH MINERALS) TABS tablet Take 1 tablet by mouth in the morning.     Omega-3 Fatty Acids (OMEGA-3 FISH OIL) 1200 MG CAPS Take 1,200 mg by mouth daily.     OVER THE COUNTER MEDICATION Take 1 tablet by mouth at bedtime. Nervive supplement     rosuvastatin  (CRESTOR ) 10 MG tablet Take 1 tablet (10 mg total) by mouth daily. (Patient taking differently: Take 10 mg by mouth at bedtime.) 100 tablet 3   sertraline  (ZOLOFT ) 100 MG tablet Take 1 tablet (100 mg total) by mouth daily. 100 tablet 3   No current  facility-administered medications for this visit.      ___________________________________________________________________ Objective   Exam:  BP 130/66 (BP Location: Left Arm, Patient Position: Sitting, Cuff Size: Normal)   Pulse 84   Ht 5' 2.5 (1.588 m) Comment: Height measured without shoes  Wt 158 lb 2 oz (71.7 kg)   BMI 28.46 kg/m  Wt Readings from Last 3 Encounters:  01/15/24 158 lb  2 oz (71.7 kg)  12/26/23 155 lb (70.3 kg)  11/13/23 158 lb 11.7 oz (72 kg)   MA Rovonda Beatty present for exam General: Well-appearing Eyes: sclera anicteric, no redness ENT: oral mucosa moist without lesions, no cervical or supraclavicular lymphadenopathy CV: Regular without appreciable murmur, no JVD, no peripheral edema Resp: clear to auscultation bilaterally, normal RR and effort noted GI: soft, no tenderness, with active bowel sounds. No guarding or palpable organomegaly noted. Skin; warm and dry, no rash or jaundice noted Neuro: awake, alert and oriented x 3. Normal gross motor function and fluent speech  Rectal: Decreased resting rectal tone      scant soft brown stool rectal vault Rectal swab taken for C. difficile PCR Labs:     Latest Ref Rng & Units 10/20/2023   11:10 AM 05/24/2023    4:39 AM 05/23/2023    1:11 PM  CBC  WBC 4.0 - 10.5 K/uL 11.5  8.0  6.0   Hemoglobin 12.0 - 15.0 g/dL 89.6  88.2  87.4   Hematocrit 36.0 - 46.0 % 30.8  36.8  38.8   Platelets 150 - 400 K/uL 179  216  197       Latest Ref Rng & Units 10/21/2023    4:42 AM 10/20/2023   11:10 AM 05/24/2023    4:39 AM  CMP  Glucose 70 - 99 mg/dL 760  739  885   BUN 8 - 23 mg/dL 45  53  15   Creatinine 0.44 - 1.00 mg/dL 8.32  7.45  9.16   Sodium 135 - 145 mmol/L 132  128  139   Potassium 3.5 - 5.1 mmol/L 4.1  3.2  3.1   Chloride 98 - 111 mmol/L 104  94  107   CO2 22 - 32 mmol/L 19  20  22    Calcium  8.9 - 10.3 mg/dL 8.5  8.8  8.9   Total Protein 6.5 - 8.1 g/dL  6.1    Total Bilirubin 0.0 - 1.2 mg/dL  0.7     Alkaline Phos 38 - 126 U/L  176    AST 15 - 41 U/L  17    ALT 0 - 44 U/L  29       Radiologic Studies:  CLINICAL DATA:  68 year old female with abdomen and flank pain. History of staghorn calculi, status post recent lithotripsy. Previous left side nephrostomy.   EXAM: CT ABDOMEN AND PELVIS WITHOUT CONTRAST   TECHNIQUE: Multidetector CT imaging of the abdomen and pelvis was performed following the standard protocol without IV contrast.   RADIATION DOSE REDUCTION: This exam was performed according to the departmental dose-optimization program which includes automated exposure control, adjustment of the mA and/or kV according to patient size and/or use of iterative reconstruction technique.   COMPARISON:  CT Abdomen and Pelvis 05/28/2023 and earlier.   FINDINGS: Lower chest: Streaky and platelike opacity now at both lung bases, more resembles atelectasis than infection. No pleural effusion. Calcified coronary artery atherosclerosis. Calcified aortic atherosclerosis. No pericardial effusion or cardiomegaly.   Hepatobiliary: Stable and negative noncontrast appearance.   Pancreas: Negative.   Spleen: Negative.   Adrenals/Urinary Tract: Adrenal glands are stable and within normal limits. Left nephrostomy and ureteral stent have been removed since February. Regressed but not resolved left nephrolithiasis since that time, residual calcifications in the left kidney up to 8 mm. No left hydronephrosis or hydroureter.   Contralateral right nephromegaly, moderate to severe right hydronephrosis and right hydroureter. Regression of the previous staghorn  calculus, but right lower pole confluent calcifications encompass up to 11 mm. Dilated and tortuous right ureter to the level of clustered calcifications at the pelvic inlet which are numerous. Confluent, up to 15 mm collection of calcifications as seen on coronal image 58. Distal to that additional extensive and confluent distal  right ureter calcifications best seen on coronal image 63, more than 2 cm in length and about 6-7 mm diameter (series 2, image 66.   No calculi within the bladder which seems to remain normal.   Stomach/Bowel: Redundant sigmoid colon with diverticulosis. No dilated large or small bowel. Decompressed right colon. Cecum on a lax mesentery. Evidence of prior appendectomy.   However, chronic calcification of a lower abdominal small bowel loop which today is on coronal image 42, previously just to the right of midline, and appearance of that loop unchanged from December.   No bowel enlargement or regional inflammation there.   But furthermore, faint nodular calcification along the noncontrast SMA on series 2, image 55 stable.   No pneumoperitoneum. No free fluid. No peritoneal space inflammation is identified.   Vascular/Lymphatic: Chronic severe Aortoiliac calcified atherosclerosis. Vascular patency is not evaluated in the absence of IV contrast.   Reproductive: Stable and negative noncontrast appearance.   Other: No pelvis free fluid.   Musculoskeletal: Advanced chronic lumbar spine degeneration is stable. No acute osseous abnormality identified.   IMPRESSION: 1. Positive for Moderate to Severe Obstructive Uropathy on the Right due to severe Steinstrasse in the right ureter at and below the pelvic inlet.   2. 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 Recommend follow-up outpatient CT Enterography to evaluate: Abnormal calcification of a distal small bowel loop in the lower abdomen (coronal image 42) and associated suspicious nodular calcification in the nearby small bowel mesentery (coronal image 29).   3. Regressed bilateral staghorn calculi since February but bulky intrarenal calcifications persist.     Electronically Signed   By: VEAR Hurst M.D.   On: 10/20/2023 12:08   Encounter Diagnoses   Name Primary?   Abnormal finding on GI tract imaging Yes   Chronic diarrhea     Assessment & Plan  Abnormal CT urogram finding of the small bowel and nearby mesentery.  I personally reviewed the images and the 2 areas in question are near but not adjacent to each other.  1 appears to be within a small bowel loop and the other in the nearby mesentery.  I cannot characterize them any better than the radiologist on the scan, but I understand the concern for carcinoid given their calcification and locations. The recommendation was for a CT enterography which seems prudent.  We will get that scheduled, but today we will get a BUN and creatinine to make sure those have normalized after some obstructive uropathy months ago (see above labs).  If the nature of these findings is still uncertain of the CTE, then a dototate scan may be warranted.   Regarding her change in bowel habits, C. difficile swab was obtained and sent today.   Thank you for the courtesy of this consult.  Please call me with any questions or concerns.  Victory LITTIE Brand III  CC: Referring provider noted above

## 2024-01-15 NOTE — Patient Instructions (Addendum)
 Your provider has requested that you go to the basement level for lab work before leaving today. Press B on the elevator. The lab is located at the first door on the left as you exit the elevator.   You have been scheduled for a CT Enterography  at [facility], 1st floor Radiology. You are scheduled on 01/22/24 at 2:30 pm. You should arrive 15 minutes prior to your appointment time for registration.   Please follow the written instructions below on the day of your exam:   1) Do not eat anything after 10:30 am  (4 hours prior to your test)   You may take any medications as prescribed with a small amount of water , if necessary. If you take any of the following medications: METFORMIN , GLUCOPHAGE , GLUCOVANCE, AVANDAMET, RIOMET , FORTAMET , ACTOPLUS MET, JANUMET, GLUMETZA  or METAGLIP, you MAY be asked to HOLD this medication 48 hours AFTER the exam.   The purpose of you drinking the oral contrast is to aid in the visualization of your intestinal tract. The contrast solution may cause some diarrhea. Depending on your individual set of symptoms, you may also receive an intravenous injection of x-ray contrast/dye. Plan on being at [facility] for 45 minutes or longer, depending on the type of exam you are having performed.   If you have any questions regarding your exam or if you need to reschedule, you may call [facility] at [phone] between the hours of 8:00 am and 5:00 pm, Monday-Friday.   Due to recent changes in healthcare laws, you may see the results of your imaging and laboratory studies on MyChart before your provider has had a chance to review them.  We understand that in some cases there may be results that are confusing or concerning to you. Not all laboratory results come back in the same time frame and the provider may be waiting for multiple results in order to interpret others.  Please give us  48 hours in order for your provider to thoroughly review all the results before  contacting the office for clarification of your results.   _______________________________________________________  If your blood pressure at your visit was 140/90 or greater, please contact your primary care physician to follow up on this.  _______________________________________________________  If you are age 70 or older, your body mass index should be between 23-30. Your Body mass index is 28.46 kg/m. If this is out of the aforementioned range listed, please consider follow up with your Primary Care Provider.  If you are age 43 or younger, your body mass index should be between 19-25. Your Body mass index is 28.46 kg/m. If this is out of the aformentioned range listed, please consider follow up with your Primary Care Provider.   ________________________________________________________  The [facility] providers would like to encourage you to use MYCHART to communicate with providers for non-urgent requests or questions.  Due to long hold times on the telephone, sending your provider a message by [facility] may be a faster and more efficient way to get a response.  Please allow 48 business hours for a response.  Please remember that this is for non-urgent requests.  _______________________________________________________  [facility] is using a team-based approach to care.  Your team is made up of your doctor and two to three APPS. Our APPS (Nurse Practitioners and Physician Assistants) work with your physician to ensure care continuity for you. They are fully qualified to address your health concerns and develop a treatment plan. They communicate directly with  your gastroenterologist to care for you. Seeing the Advanced Practice Practitioners on your physician's team can help you by facilitating care more promptly, often allowing for earlier appointments, access to diagnostic testing, procedures, and other specialty referrals.   Thank you for choosing me and [facility].  Dr.Danis

## 2024-01-20 ENCOUNTER — Other Ambulatory Visit: Payer: Self-pay | Admitting: Family Medicine

## 2024-01-20 DIAGNOSIS — F40242 Fear of bridges: Secondary | ICD-10-CM

## 2024-01-20 DIAGNOSIS — F418 Other specified anxiety disorders: Secondary | ICD-10-CM

## 2024-01-21 ENCOUNTER — Ambulatory Visit: Admitting: Family Medicine

## 2024-01-21 ENCOUNTER — Ambulatory Visit (INDEPENDENT_AMBULATORY_CARE_PROVIDER_SITE_OTHER)

## 2024-01-21 ENCOUNTER — Telehealth: Payer: Self-pay

## 2024-01-21 ENCOUNTER — Other Ambulatory Visit (HOSPITAL_COMMUNITY): Payer: Self-pay

## 2024-01-21 ENCOUNTER — Encounter: Payer: Self-pay | Admitting: Family Medicine

## 2024-01-21 VITALS — BP 119/63 | HR 64 | Temp 97.0°F | Ht 62.5 in | Wt 159.4 lb

## 2024-01-21 DIAGNOSIS — M8588 Other specified disorders of bone density and structure, other site: Secondary | ICD-10-CM

## 2024-01-21 DIAGNOSIS — Z7985 Long-term (current) use of injectable non-insulin antidiabetic drugs: Secondary | ICD-10-CM

## 2024-01-21 DIAGNOSIS — E1159 Type 2 diabetes mellitus with other circulatory complications: Secondary | ICD-10-CM | POA: Diagnosis not present

## 2024-01-21 DIAGNOSIS — Z780 Asymptomatic menopausal state: Secondary | ICD-10-CM | POA: Diagnosis not present

## 2024-01-21 DIAGNOSIS — R413 Other amnesia: Secondary | ICD-10-CM

## 2024-01-21 DIAGNOSIS — Z23 Encounter for immunization: Secondary | ICD-10-CM | POA: Diagnosis not present

## 2024-01-21 DIAGNOSIS — M8580 Other specified disorders of bone density and structure, unspecified site: Secondary | ICD-10-CM

## 2024-01-21 DIAGNOSIS — Z0001 Encounter for general adult medical examination with abnormal findings: Secondary | ICD-10-CM

## 2024-01-21 DIAGNOSIS — H539 Unspecified visual disturbance: Secondary | ICD-10-CM | POA: Diagnosis not present

## 2024-01-21 DIAGNOSIS — I152 Hypertension secondary to endocrine disorders: Secondary | ICD-10-CM

## 2024-01-21 DIAGNOSIS — E1169 Type 2 diabetes mellitus with other specified complication: Secondary | ICD-10-CM

## 2024-01-21 DIAGNOSIS — I700 Atherosclerosis of aorta: Secondary | ICD-10-CM | POA: Diagnosis not present

## 2024-01-21 DIAGNOSIS — E785 Hyperlipidemia, unspecified: Secondary | ICD-10-CM | POA: Diagnosis not present

## 2024-01-21 DIAGNOSIS — G4733 Obstructive sleep apnea (adult) (pediatric): Secondary | ICD-10-CM

## 2024-01-21 DIAGNOSIS — E119 Type 2 diabetes mellitus without complications: Secondary | ICD-10-CM

## 2024-01-21 DIAGNOSIS — I708 Atherosclerosis of other arteries: Secondary | ICD-10-CM | POA: Diagnosis not present

## 2024-01-21 DIAGNOSIS — Z0000 Encounter for general adult medical examination without abnormal findings: Secondary | ICD-10-CM

## 2024-01-21 DIAGNOSIS — F418 Other specified anxiety disorders: Secondary | ICD-10-CM

## 2024-01-21 DIAGNOSIS — Z820 Family history of epilepsy and other diseases of the nervous system: Secondary | ICD-10-CM

## 2024-01-21 LAB — BAYER DCA HB A1C WAIVED: HB A1C (BAYER DCA - WAIVED): 6.5 % — ABNORMAL HIGH (ref 4.8–5.6)

## 2024-01-21 LAB — LIPID PANEL

## 2024-01-21 MED ORDER — METFORMIN HCL 1000 MG PO TABS: 1000.0000 mg | ORAL_TABLET | Freq: Two times a day (BID) | ORAL | 3 refills | Status: AC

## 2024-01-21 MED ORDER — TRULICITY 3 MG/0.5ML ~~LOC~~ SOAJ: 3.0000 mg | SUBCUTANEOUS | 3 refills | Status: AC

## 2024-01-21 MED ORDER — SERTRALINE HCL 100 MG PO TABS: 100.0000 mg | ORAL_TABLET | Freq: Every day | ORAL | 3 refills | Status: AC

## 2024-01-21 MED ORDER — AMLODIPINE BESYLATE 5 MG PO TABS: 5.0000 mg | ORAL_TABLET | Freq: Every day | ORAL | 3 refills | Status: AC

## 2024-01-21 MED ORDER — ROSUVASTATIN CALCIUM 10 MG PO TABS: 10.0000 mg | ORAL_TABLET | Freq: Every day | ORAL | 3 refills | Status: AC

## 2024-01-21 MED ORDER — FREESTYLE LIBRE 3 PLUS SENSOR MISC
3 refills | Status: DC
Start: 2024-01-21 — End: 2024-01-31

## 2024-01-21 NOTE — Progress Notes (Signed)
 Adrienne Davidson is a 68 y.o. female presents to office today for annual physical exam examination.    She has been under a bit more stress since her last visit.  She has had pretty major renal stones that resulted in a deep dive in her renal function.  She was found to have an incidental mass in the small bowel that will be further evaluated by CT with contrast tomorrow.  She is going to have renal ultrasounds repeated in December to follow-up on stones that were treated.  She reports normal urine output.  She reports no abdominal pain, rectal bleeding.  She does report some changes in memory where she has to really concentrate on how to do certain tasks that she is familiar with.  She also finds that she has issues remembering people's names.  This brings her concerned because she is the caregiver for her husband, who suffers from debilitating neuropathy.  She notes that there is a family history of Alzheimer's in her maternal aunt, uncle and grandfather.  Symptoms seem to have progressed over the last 6 months, specifically while she has been so ill and in and out of the hospital  She also reports some worsening of her vision where she feels like it is harder and harder to see light.  She also has new floaters.  She sees optometry and has had a retinal screen here that was unremarkable.  Would like referral to ophthalmology if possible today  Compliant with all medications for blood pressure, cholesterol and diabetes.  Reports no hypoglycemic episodes.  Would like to switch to freestyle libre if possible given visual disturbances   Occupation: retired, Marital status: married, Substance use: none Health Maintenance Due  Topic Date Due   DEXA SCAN  06/28/2023   Medicare Annual Wellness (AWV)  07/19/2023   HEMOGLOBIN A1C  11/18/2023   Influenza Vaccine  11/22/2023   Diabetic kidney evaluation - Urine ACR  01/18/2024    Immunization History  Administered Date(s) Administered   Fluad Quad(high  Dose 65+) 01/22/2019, 01/22/2020, 02/27/2021, 03/05/2022   Fluad Trivalent(High Dose 65+) 01/18/2023   Influenza Split 02/06/2017   Influenza,inj,Quad PF,6+ Mos 12/20/2017   Moderna Covid-19 Vaccine Bivalent Booster 48yrs & up 02/13/2021   Moderna SARS-COV2 Booster Vaccination 08/24/2020   Moderna Sars-Covid-2 Vaccination 05/20/2019, 06/17/2019, 03/02/2020   PNEUMOCOCCAL CONJUGATE-20 07/17/2023   Pneumococcal Conjugate-13 10/27/2018   Pneumococcal Polysaccharide-23 12/20/2015   Tdap 06/27/2021   Zoster Recombinant(Shingrix ) 10/27/2018, 01/01/2019   Past Medical History:  Diagnosis Date   Allergic rhinitis    Allergy    Anxiety    Arthritis    Diabetes mellitus (HCC)    Hyperlipidemia    Hypertension    Psoriasis    Bilateral feet   Sleep apnea    Sleep apnea    Vitamin D  deficiency    Social History   Socioeconomic History   Marital status: Married    Spouse name: Jerel   Number of children: 2   Years of education: 12   Highest education level: Associate degree: occupational, Scientist, product/process development, or vocational program  Occupational History   Occupation: paralegal    Comment: retired  Tobacco Use   Smoking status: Former    Current packs/day: 0.00    Average packs/day: 0.3 packs/day for 25.0 years (6.3 ttl pk-yrs)    Types: Cigarettes    Start date: 06/26/1993    Quit date: 06/27/2018    Years since quitting: 5.5   Smokeless tobacco: Never  Vaping  Use   Vaping status: Never Used  Substance and Sexual Activity   Alcohol use: Not Currently    Comment: wine once per year   Drug use: Not Currently   Sexual activity: Not Currently    Birth control/protection: None  Other Topics Concern   Not on file  Social History Narrative   Not on file   Social Drivers of Health   Financial Resource Strain: Medium Risk (01/15/2024)   Overall Financial Resource Strain (CARDIA)    Difficulty of Paying Living Expenses: Somewhat hard  Food Insecurity: No Food Insecurity (01/15/2024)    Hunger Vital Sign    Worried About Running Out of Food in the Last Year: Never true    Ran Out of Food in the Last Year: Never true  Transportation Needs: No Transportation Needs (01/15/2024)   PRAPARE - Administrator, Civil Service (Medical): No    Lack of Transportation (Non-Medical): No  Physical Activity: Insufficiently Active (01/15/2024)   Exercise Vital Sign    Days of Exercise per Week: 2 days    Minutes of Exercise per Session: 50 min  Stress: No Stress Concern Present (01/15/2024)   [employer] of Occupational Health - Occupational Stress Questionnaire    Feeling of Stress: Only a little  Social Connections: Moderately Integrated (01/15/2024)   Social Connection and Isolation Panel    Frequency of Communication with Friends and Family: More than three times a week    Frequency of Social Gatherings with Friends and Family: Once a week    Attends Religious Services: 1 to 4 times per year    Active Member of [employer] or Organizations: No    Attends Banker Meetings: Not on file    Marital Status: Married  Catering manager Violence: Not At Risk (10/20/2023)   Humiliation, Afraid, Rape, and Kick questionnaire    Fear of Current or Ex-Partner: No    Emotionally Abused: No    Physically Abused: No    Sexually Abused: No   Past Surgical History:  Procedure Laterality Date   APPENDECTOMY     CESAREAN SECTION  1980   CESAREAN SECTION  1984   CYSTOSCOPY W/ URETERAL STENT PLACEMENT Bilateral 10/20/2023   Procedure: CYSTOSCOPY, WITH RETROGRADE PYELOGRAM AND URETERAL STENT INSERTION;  Surgeon: Alvaro Ricardo KATHEE Mickey., MD;  Location: [facility];  Service: Urology;  Laterality: Bilateral;   CYSTOSCOPY/RETROGRADE/URETEROSCOPY/STONE EXTRACTION WITH BASKET Right 11/14/2023   Procedure: CYSTOSCOPY, WITH CALCULUS REMOVAL USING BASKET;  Surgeon: Sherrilee Belvie CROME, MD;  Location: [facility];  Service: Urology;  Laterality: Right;   CYSTOSCOPY/RETROGRADE/URETEROSCOPY/STONE  EXTRACTION WITH BASKET Bilateral 12/26/2023   Procedure: CYSTOSCOPY, WITH CALCULUS REMOVAL USING BASKET;  Surgeon: Sherrilee Belvie CROME, MD;  Location: [facility];  Service: Urology;  Laterality: Bilateral;   CYSTOSCOPY/URETEROSCOPY/HOLMIUM LASER/STENT PLACEMENT Right 11/14/2023   Procedure: CYSTOSCOPY/URETEROSCOPY/HOLMIUM LASER/STENT PLACEMENT;  Surgeon: Sherrilee Belvie CROME, MD;  Location: [facility];  Service: Urology;  Laterality: Right;  pt to arrive at 8:30   CYSTOSCOPY/URETEROSCOPY/HOLMIUM LASER/STENT PLACEMENT Bilateral 12/26/2023   Procedure: CYSTOSCOPY/URETEROSCOPY/HOLMIUM LASER/STENT PLACEMENT;  Surgeon: Sherrilee Belvie CROME, MD;  Location: [facility];  Service: Urology;  Laterality: Bilateral;  stent exchange   EXTRACORPOREAL SHOCK WAVE LITHOTRIPSY Right 10/08/2023   Procedure: LITHOTRIPSY, ESWL;  Surgeon: Sherrilee Belvie CROME, MD;  Location: [facility];  Service: Urology;  Laterality: Right;   HOLMIUM LASER APPLICATION Right 11/14/2023   Procedure: HOLMIUM LASER APPLICATION;  Surgeon: Sherrilee Belvie CROME, MD;  Location: [facility];  Service: Urology;  Laterality: Right;  HOLMIUM LASER APPLICATION Bilateral 12/26/2023   Procedure: HOLMIUM LASER APPLICATION;  Surgeon: Sherrilee Belvie CROME, MD;  Location: [facility];  Service: Urology;  Laterality: Bilateral;   IR URETERAL STENT RIGHT NEW ACCESS W/O SEP NEPHROSTOMY CATH  05/22/2023   NEPHROLITHOTOMY Left 05/23/2023   Procedure: NEPHROLITHOTOMY PERCUTANEOUS;  Surgeon: Sherrilee Belvie CROME, MD;  Location: [facility];  Service: Urology;  Laterality: Left;   NEPHROLITHOTOMY Left 06/24/2023   Procedure: NEPHROLITHOTOMY PERCUTANEOUS- second look;  Surgeon: Sherrilee Belvie CROME, MD;  Location: [facility];  Service: Urology;  Laterality: Left;   TONSILLECTOMY     Family History  Problem Relation Age of Onset   Non-Hodgkin's lymphoma Mother    Arthritis Mother    Cancer Mother    Heart disease Father    Arthritis Father    COPD Father    Hypertension Father    Diabetes Daughter    Rheum  arthritis Daughter    Polycystic ovary syndrome Daughter    Breast cancer Neg Hx     Current Outpatient Medications:    acetaminophen  (TYLENOL ) 500 MG tablet, Take 500-1,000 mg by mouth every 6 (six) hours as needed (pain.)., Disp: , Rfl:    Cholecalciferol (VITAMIN D3) 250 MCG (10000 UT) capsule, Take 10,000 Units by mouth daily., Disp: , Rfl:    Coenzyme Q10 100 MG TABS, Take 100 mg by mouth at bedtime., Disp: , Rfl:    Continuous Glucose Sensor (FREESTYLE LIBRE 3 PLUS SENSOR) MISC, Chech BGs continuously. Change sensor every 15 days. E11.9, Disp: 6 each, Rfl: 3   CRANBERRY PO, Take 25,200 mg by mouth at bedtime., Disp: , Rfl:    EPINEPHrine  0.3 mg/0.3 mL IJ SOAJ injection, Inject 0.3 mg into the muscle as needed for anaphylaxis. Then call 911, Disp: 2 each, Rfl: 0   FARXIGA  5 MG TABS tablet, Take 1 tablet (5 mg total) by mouth daily., Disp: 90 tablet, Rfl: 1   glucose blood test strip, Test blood sugar once daily (one touch verio if covered by ins/ otherwise whatever ins preference is) E11.65, Disp: 100 each, Rfl: 12   Lancets (ONETOUCH ULTRASOFT) lancets, Use once daily as instructed E11.65 (one touch verio), Disp: 100 each, Rfl: 12   LORazepam  (ATIVAN ) 0.5 MG tablet, Take 0.5-1 tablets (0.25-0.5 mg total) by mouth 2 (two) times daily as needed for anxiety., Disp: 10 tablet, Rfl: 0   Multiple Vitamin (MULTIVITAMIN WITH MINERALS) TABS tablet, Take 1 tablet by mouth in the morning., Disp: , Rfl:    Omega-3 Fatty Acids (OMEGA-3 FISH OIL) 1200 MG CAPS, Take 1,200 mg by mouth daily., Disp: , Rfl:    OVER THE COUNTER MEDICATION, Take 1 tablet by mouth at bedtime. Nervive supplement, Disp: , Rfl:    amLODipine  (NORVASC ) 5 MG tablet, Take 1 tablet (5 mg total) by mouth at bedtime., Disp: 100 tablet, Rfl: 3   Dulaglutide  (TRULICITY ) 3 MG/0.5ML SOAJ, Inject 3 mg as directed once a week., Disp: 6 mL, Rfl: 3   metFORMIN  (GLUCOPHAGE ) 1000 MG tablet, Take 1 tablet (1,000 mg total) by mouth 2 (two) times  daily with a meal., Disp: 200 tablet, Rfl: 3   rosuvastatin  (CRESTOR ) 10 MG tablet, Take 1 tablet (10 mg total) by mouth daily., Disp: 100 tablet, Rfl: 3   sertraline  (ZOLOFT ) 100 MG tablet, Take 1 tablet (100 mg total) by mouth daily., Disp: 100 tablet, Rfl: 3  Allergies  Allergen Reactions   Enalapril Anaphylaxis    Angioedema Aug 2018     ROS: Review  of Systems Pertinent items noted in HPI and remainder of comprehensive ROS otherwise negative.    Physical exam BP 119/63   Pulse 64   Temp (!) 97 F (36.1 C)   Ht 5' 2.5 (1.588 m)   Wt 159 lb 6.4 oz (72.3 kg)   SpO2 99%   BMI 28.69 kg/m  General appearance: alert, cooperative, appears stated age, and no distress Head: Normocephalic, without obvious abnormality, atraumatic Eyes: negative findings: lids and lashes normal, conjunctivae and sclerae normal, corneas clear, and pupils equal, round, reactive to light and accomodation Ears: normal TM's and external ear canals both ears Nose: Nares normal. Septum midline. Mucosa normal. No drainage or sinus tenderness. Throat: lips, mucosa, and tongue normal; teeth and gums normal Neck: no adenopathy, no carotid bruit, supple, symmetrical, trachea midline, and thyroid  not enlarged, symmetric, no tenderness/mass/nodules Back: symmetric, no curvature. ROM normal. No CVA tenderness. Lungs: clear to auscultation bilaterally Heart: regular rate and rhythm, S1, S2 normal, no murmur, click, rub or gallop Abdomen: soft, non-tender; bowel sounds normal; no masses,  no organomegaly Extremities: extremities normal, atraumatic, no cyanosis or edema Pulses: 2+ and symmetric Skin: Skin color, texture, turgor normal. No rashes or lesions Lymph nodes: Cervical, supraclavicular, and axillary nodes normal. Neurologic: Alert and oriented X 3, normal strength and tone. Normal symmetric reflexes. Normal coordination and gait    01/21/2024    8:32 AM  MMSE - Mini Mental State Exam  Orientation to time 5   Orientation to Place 5  Registration 3  Attention/ Calculation 5  Recall 3  Language- name 2 objects 2  Language- repeat 1  Language- follow 3 step command 3  Language- read & follow direction 1  Write a sentence 1  Copy design 1  Total score 30         01/21/2024    8:33 AM 01/31/2023   12:59 PM 01/18/2023    9:08 AM  Depression screen PHQ 2/9  Decreased Interest 1 0 0  Down, Depressed, Hopeless 1 0 0  PHQ - 2 Score 2 0 0  Altered sleeping 0  0  Tired, decreased energy 2  0  Change in appetite 1  0  Feeling bad or failure about yourself  0  0  Trouble concentrating 1  0  Moving slowly or fidgety/restless 0  0  Suicidal thoughts 0  0  PHQ-9 Score 6  0  Difficult doing work/chores Somewhat difficult  Not difficult at all      01/21/2024    8:34 AM 07/17/2023    8:44 AM 01/18/2023    9:09 AM 09/21/2022    9:24 AM  GAD 7 : Generalized Anxiety Score  Nervous, Anxious, on Edge 0 0 0 0  Control/stop worrying 0 0 0 0  Worry too much - different things 0 0 0 0  Trouble relaxing 0 0 0 0  Restless 0 0 0 0  Easily annoyed or irritable 1 0 0 0  Afraid - awful might happen 1 0 0 0  Total GAD 7 Score 2 0 0 0  Anxiety Difficulty Somewhat difficult Not difficult at all  Not difficult at all     Assessment/ Plan: Adrien ONEIDA Lamer here for annual physical exam.   Annual physical exam  Memory changes - Plan: Vitamin B12, RPR  Family history of Alzheimer disease  Vision changes - Plan: Ambulatory referral to Ophthalmology, Continuous Glucose Sensor (FREESTYLE LIBRE 3 PLUS SENSOR) MISC  Diabetes mellitus treated with injections of  non-insulin  medication (HCC) - Plan: CMP14+EGFR, Bayer DCA Hb A1c Waived, Microalbumin / creatinine urine ratio, Continuous Glucose Sensor (FREESTYLE LIBRE 3 PLUS SENSOR) MISC, Dulaglutide  (TRULICITY ) 3 MG/0.5ML SOAJ, metFORMIN  (GLUCOPHAGE ) 1000 MG tablet, rosuvastatin  (CRESTOR ) 10 MG tablet  Hypertension associated with diabetes (HCC) - Plan:  CMP14+EGFR, amLODipine  (NORVASC ) 5 MG tablet  Hyperlipidemia associated with type 2 diabetes mellitus (HCC) - Plan: CMP14+EGFR, TSH, Lipid Panel, rosuvastatin  (CRESTOR ) 10 MG tablet  Aorto-iliac atherosclerosis - Plan: CMP14+EGFR, TSH, Lipid Panel  Osteopenia after menopause - Plan: CMP14+EGFR, VITAMIN D  25 Hydroxy (Vit-D Deficiency, Fractures), DG [facility] DEXA  OSA on CPAP - Plan: CMP14+EGFR, CBC with Differential  Situational anxiety - Plan: sertraline  (ZOLOFT ) 100 MG tablet   Flu shot given  Check vitamin B12, RPR.  MMSE demonstrated no evidence of dementia, scoring 30 out of 30.  Discussed possible referral to neurology should she desire more in-depth testing. ?  Memory lability due to stressors  CGM ordered, particularly given visual changes in the setting of type 2 diabetes.  I think this would be a better option for her given some dexterity issues and visual impairment.  Medications have been renewed.  Check urine microalbumin  Blood pressure controlled.  No changes.  Medications renewed  Fasting labs collected.  Continue statin to prevent progression of aortoiliac atherosclerosis  Plan for DEXA.  Check vitamin D  level, calcium  level and renal function today  OSA stable  Situational anxiety likely exacerbated given abdominal mass and general decline in health.  I would like to see her more closely due to these issues to ensure that she is doing okay through the holidays  Counseled on healthy lifestyle choices, including diet (rich in fruits, vegetables and lean meats and low in salt and simple carbohydrates) and exercise (at least 30 minutes of moderate physical activity daily).  Patient to follow up 3 to 4 months, sooner if concerns arise  Gilmore List M. Jolinda, DO

## 2024-01-21 NOTE — Telephone Encounter (Signed)
 Pharmacy Patient Advocate Encounter   Received notification from Onbase that prior authorization for FreeStyle Libre 3 Plus Sensor  is required/requested.   Insurance verification completed.   The patient is insured through [employer] .   Per test claim: PA required; PA submitted to above mentioned insurance via Latent Key/confirmation #/EOC [facility] Status is pending

## 2024-01-22 ENCOUNTER — Ambulatory Visit: Payer: Self-pay | Admitting: Family Medicine

## 2024-01-22 ENCOUNTER — Ambulatory Visit (HOSPITAL_COMMUNITY)
Admission: RE | Admit: 2024-01-22 | Discharge: 2024-01-22 | Disposition: A | Discharge status: Home / Self Care | Source: Ambulatory Visit | Attending: Gastroenterology | Admitting: Gastroenterology

## 2024-01-22 DIAGNOSIS — N200 Calculus of kidney: Secondary | ICD-10-CM | POA: Diagnosis not present

## 2024-01-22 DIAGNOSIS — R933 Abnormal findings on diagnostic imaging of other parts of digestive tract: Secondary | ICD-10-CM | POA: Insufficient documentation

## 2024-01-22 DIAGNOSIS — K5730 Diverticulosis of large intestine without perforation or abscess without bleeding: Secondary | ICD-10-CM | POA: Diagnosis not present

## 2024-01-22 DIAGNOSIS — K529 Noninfective gastroenteritis and colitis, unspecified: Secondary | ICD-10-CM | POA: Insufficient documentation

## 2024-01-22 DIAGNOSIS — K429 Umbilical hernia without obstruction or gangrene: Secondary | ICD-10-CM | POA: Diagnosis not present

## 2024-01-22 LAB — CMP14+EGFR
ALT: 14 IU/L (ref 0–32)
AST: 16 IU/L (ref 0–40)
Albumin: 4.3 g/dL (ref 3.9–4.9)
Alkaline Phosphatase: 68 IU/L (ref 49–135)
BUN/Creatinine Ratio: 18 (ref 12–28)
BUN: 23 mg/dL (ref 8–27)
Bilirubin Total: 0.3 mg/dL (ref 0.0–1.2)
CO2: 23 mmol/L (ref 20–29)
Calcium: 10.2 mg/dL (ref 8.7–10.3)
Chloride: 102 mmol/L (ref 96–106)
Creatinine, Ser: 1.27 mg/dL — ABNORMAL HIGH (ref 0.57–1.00)
Globulin, Total: 2.4 g/dL (ref 1.5–4.5)
Glucose: 118 mg/dL — AB (ref 70–99)
Potassium: 4.8 mmol/L (ref 3.5–5.2)
Sodium: 138 mmol/L (ref 134–144)
Total Protein: 6.7 g/dL (ref 6.0–8.5)
eGFR: 45 mL/min/1.73 — ABNORMAL LOW (ref >59)

## 2024-01-22 LAB — LIPID PANEL
Cholesterol, Total: 142 mg/dL (ref 100–199)
HDL: 57 mg/dL (ref >39)
LDL CALC COMMENT:: 2.5 ratio (ref 0.0–4.4)
LDL Chol Calc (NIH): 64 mg/dL (ref 0–99)
Triglycerides: 120 mg/dL (ref 0–149)
VLDL Cholesterol Cal: 21 mg/dL (ref 5–40)

## 2024-01-22 LAB — MICROALBUMIN / CREATININE URINE RATIO
Creatinine, Urine: 34.9 mg/dL
Microalb/Creat Ratio: 263 mg/g{creat} — ABNORMAL HIGH (ref 0–29)
Microalbumin, Urine: 91.7 ug/mL

## 2024-01-22 LAB — CBC WITH DIFFERENTIAL/PLATELET
Basophils Absolute: 0.1 x10E3/uL (ref 0.0–0.2)
Basos: 1 %
EOS (ABSOLUTE): 0.2 x10E3/uL (ref 0.0–0.4)
Eos: 3 %
Hematocrit: 36 % (ref 34.0–46.6)
Hemoglobin: 10.8 g/dL — ABNORMAL LOW (ref 11.1–15.9)
Immature Grans (Abs): 0 x10E3/uL (ref 0.0–0.1)
Immature Granulocytes: 0 %
Lymphocytes Absolute: 2 x10E3/uL (ref 0.7–3.1)
Lymphs: 32 %
MCH: 25.3 pg — ABNORMAL LOW (ref 26.6–33.0)
MCHC: 30 g/dL — ABNORMAL LOW (ref 31.5–35.7)
MCV: 84 fL (ref 79–97)
Monocytes Absolute: 0.5 x10E3/uL (ref 0.1–0.9)
Monocytes: 8 %
Neutrophils Absolute: 3.5 x10E3/uL (ref 1.4–7.0)
Neutrophils: 56 %
Platelets: 271 x10E3/uL (ref 150–450)
RBC: 4.27 x10E6/uL (ref 3.77–5.28)
RDW: 16.5 % — ABNORMAL HIGH (ref 11.7–15.4)
WBC: 6.3 x10E3/uL (ref 3.4–10.8)

## 2024-01-22 LAB — VITAMIN B12: Vitamin B-12: 419 pg/mL (ref 232–1245)

## 2024-01-22 LAB — VITAMIN D 25 HYDROXY (VIT D DEFICIENCY, FRACTURES): Vit D, 25-Hydroxy: 55.1 ng/mL (ref 30.0–100.0)

## 2024-01-22 LAB — TSH: TSH: 1.43 u[IU]/mL (ref 0.450–4.500)

## 2024-01-22 LAB — RPR: RPR Ser Ql: NONREACTIVE

## 2024-01-22 MED ORDER — IOHEXOL 300 MG/ML  SOLN
100.0000 mL | Freq: Once | INTRAMUSCULAR | Status: AC | PRN
Start: 2024-01-22 — End: 2024-01-22
  Administered 2024-01-22: 100 mL via INTRAVENOUS

## 2024-01-22 MED ORDER — SODIUM CHLORIDE (PF) 0.9 % IJ SOLN
INTRAMUSCULAR | Status: AC
  Filled 2024-01-22: qty 50

## 2024-01-23 ENCOUNTER — Telehealth: Payer: Self-pay | Admitting: Gastroenterology

## 2024-01-23 ENCOUNTER — Other Ambulatory Visit: Payer: Self-pay

## 2024-01-23 DIAGNOSIS — R933 Abnormal findings on diagnostic imaging of other parts of digestive tract: Secondary | ICD-10-CM

## 2024-01-23 NOTE — Telephone Encounter (Signed)
 Patient returning call, please advise.

## 2024-01-23 NOTE — Telephone Encounter (Signed)
 See alternate note

## 2024-01-23 NOTE — Telephone Encounter (Signed)
 Patient returning call Requesting a call back  Please advise  Thank you

## 2024-01-24 ENCOUNTER — Other Ambulatory Visit (HOSPITAL_COMMUNITY): Payer: Self-pay

## 2024-01-24 DIAGNOSIS — M8589 Other specified disorders of bone density and structure, multiple sites: Secondary | ICD-10-CM | POA: Diagnosis not present

## 2024-01-24 DIAGNOSIS — Z780 Asymptomatic menopausal state: Secondary | ICD-10-CM | POA: Diagnosis not present

## 2024-01-24 NOTE — Telephone Encounter (Signed)
 Pharmacy Patient Advocate Encounter  Received notification from [facility] that Prior Authorization for  FreeStyle Libre 3 Plus Sensor  has been DENIED.  See denial reason below. No denial letter attached in CMM. Will attach denial letter to Media tab once received.   PA #/Case ID/Reference #: [id number]

## 2024-01-24 NOTE — Telephone Encounter (Signed)
 I called and spoke with patients spouse and made him aware. He will give patient the message.

## 2024-01-24 NOTE — Telephone Encounter (Signed)
 Please inform pt that as I feared, her ins won't cover the [place] without being on insulin 

## 2024-01-27 ENCOUNTER — Ambulatory Visit

## 2024-01-31 ENCOUNTER — Ambulatory Visit (INDEPENDENT_AMBULATORY_CARE_PROVIDER_SITE_OTHER)

## 2024-01-31 VITALS — BP 119/63 | HR 64 | Ht 62.0 in | Wt 159.0 lb

## 2024-01-31 DIAGNOSIS — Z0000 Encounter for general adult medical examination without abnormal findings: Secondary | ICD-10-CM

## 2024-01-31 NOTE — Progress Notes (Signed)
 Subjective:   Adrienne Davidson is a 68 y.o. who presents for a Medicare Wellness preventive visit.  As a reminder, Annual Wellness Visits don't include a physical exam, and some assessments may be limited, especially if this visit is performed virtually. We may recommend an in-person follow-up visit with your provider if needed.  Visit Complete: Virtual I connected with  Adrienne Davidson on 01/31/24 by a audio enabled telemedicine application and verified that I am speaking with the correct person using two identifiers.  Patient Location: Home  Provider Location: Office/Clinic  I discussed the limitations of evaluation and management by telemedicine. The patient expressed understanding and agreed to proceed.  Vital Signs: Because this visit was a virtual/telehealth visit, some criteria may be missing or patient reported. Any vitals not documented were not able to be obtained and vitals that have been documented are patient reported.  VideoDeclined- This patient declined Librarian, academic. Therefore the visit was completed with audio only.  Persons Participating in Visit: Patient.  AWV Questionnaire: No: Patient Medicare AWV questionnaire was not completed prior to this visit.        Objective:    Today's Vitals   01/31/24 1209  BP: 119/63  Pulse: 64  Weight: 159 lb (72.1 kg)  Height: 5' 2 (1.575 m)   Body mass index is 29.08 kg/m.     01/31/2024   12:12 PM 11/14/2023    8:59 AM 10/20/2023    5:49 PM 10/20/2023   11:13 AM 10/04/2023   11:34 AM 06/24/2023    8:23 AM 06/20/2023   10:01 AM  Advanced Directives  Does Patient Have a Medical Advance Directive? No Yes Yes Yes Yes No No  Type of Advance Directive  Living will;Healthcare Power of [employer] Power of [place];Living will  Healthcare Power of [place];Living will    Does patient want to make changes to medical advance directive?  No - Patient declined No - Patient declined       Copy of Healthcare Power of Attorney in Chart?  No - copy requested No - copy requested  No - copy requested    Would patient like information on creating a medical advance directive?      No - Patient declined No - Patient declined    Current Medications (verified) Outpatient Encounter Medications as of 01/31/2024  Medication Sig   acetaminophen  (TYLENOL ) 500 MG tablet Take 500-1,000 mg by mouth every 6 (six) hours as needed (pain.).   amLODipine  (NORVASC ) 5 MG tablet Take 1 tablet (5 mg total) by mouth at bedtime.   Cholecalciferol (VITAMIN D3) 250 MCG (10000 UT) capsule Take 10,000 Units by mouth daily.   Coenzyme Q10 100 MG TABS Take 100 mg by mouth at bedtime.   CRANBERRY PO Take 25,200 mg by mouth at bedtime.   Dulaglutide  (TRULICITY ) 3 MG/0.5ML SOAJ Inject 3 mg as directed once a week.   EPINEPHrine  0.3 mg/0.3 mL IJ SOAJ injection Inject 0.3 mg into the muscle as needed for anaphylaxis. Then call 911   FARXIGA  5 MG TABS tablet Take 1 tablet (5 mg total) by mouth daily.   glucose blood test strip Test blood sugar once daily (one touch verio if covered by ins/ otherwise whatever ins preference is) E11.65   Lancets (ONETOUCH ULTRASOFT) lancets Use once daily as instructed E11.65 (one touch verio)   LORazepam  (ATIVAN ) 0.5 MG tablet Take 0.5-1 tablets (0.25-0.5 mg total) by mouth 2 (two) times daily as needed for anxiety.  metFORMIN  (GLUCOPHAGE ) 1000 MG tablet Take 1 tablet (1,000 mg total) by mouth 2 (two) times daily with a meal.   Multiple Vitamin (MULTIVITAMIN WITH MINERALS) TABS tablet Take 1 tablet by mouth in the morning.   Omega-3 Fatty Acids (OMEGA-3 FISH OIL) 1200 MG CAPS Take 1,200 mg by mouth daily.   OVER THE COUNTER MEDICATION Take 1 tablet by mouth at bedtime. Nervive supplement   rosuvastatin  (CRESTOR ) 10 MG tablet Take 1 tablet (10 mg total) by mouth daily.   sertraline  (ZOLOFT ) 100 MG tablet Take 1 tablet (100 mg total) by mouth daily.   [DISCONTINUED] Continuous  Glucose Sensor (FREESTYLE LIBRE 3 PLUS SENSOR) MISC [place] BGs continuously. Change sensor every 15 days. E11.9   No facility-administered encounter medications on file as of 01/31/2024.    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   Psoriasis    Bilateral feet   Sleep apnea    Sleep apnea    Vitamin D  deficiency    Past Surgical History:  Procedure Laterality Date   APPENDECTOMY     CESAREAN SECTION  1980   CESAREAN SECTION  1984   CYSTOSCOPY W/ URETERAL STENT PLACEMENT Bilateral 10/20/2023   Procedure: CYSTOSCOPY, WITH RETROGRADE PYELOGRAM AND URETERAL STENT INSERTION;  Surgeon: Alvaro Ricardo KATHEE Mickey., MD;  Location: [facility];  Service: Urology;  Laterality: Bilateral;   CYSTOSCOPY/RETROGRADE/URETEROSCOPY/STONE EXTRACTION WITH BASKET Right 11/14/2023   Procedure: CYSTOSCOPY, WITH CALCULUS REMOVAL USING BASKET;  Surgeon: Sherrilee Belvie CROME, MD;  Location: [facility];  Service: Urology;  Laterality: Right;   CYSTOSCOPY/RETROGRADE/URETEROSCOPY/STONE EXTRACTION WITH BASKET Bilateral 12/26/2023   Procedure: CYSTOSCOPY, WITH CALCULUS REMOVAL USING BASKET;  Surgeon: Sherrilee Belvie CROME, MD;  Location: [facility];  Service: Urology;  Laterality: Bilateral;   CYSTOSCOPY/URETEROSCOPY/HOLMIUM LASER/STENT PLACEMENT Right 11/14/2023   Procedure: CYSTOSCOPY/URETEROSCOPY/HOLMIUM LASER/STENT PLACEMENT;  Surgeon: Sherrilee Belvie CROME, MD;  Location: [facility];  Service: Urology;  Laterality: Right;  pt to arrive at 8:30   CYSTOSCOPY/URETEROSCOPY/HOLMIUM LASER/STENT PLACEMENT Bilateral 12/26/2023   Procedure: CYSTOSCOPY/URETEROSCOPY/HOLMIUM LASER/STENT PLACEMENT;  Surgeon: Sherrilee Belvie CROME, MD;  Location: [facility];  Service: Urology;   Laterality: Bilateral;  stent exchange   EXTRACORPOREAL SHOCK WAVE LITHOTRIPSY Right 10/08/2023   Procedure: LITHOTRIPSY, ESWL;  Surgeon: Sherrilee Belvie CROME, MD;  Location: [facility];  Service: Urology;  Laterality: Right;   HOLMIUM LASER APPLICATION Right 11/14/2023   Procedure: HOLMIUM LASER APPLICATION;  Surgeon: Sherrilee Belvie CROME, MD;  Location: [facility];  Service: Urology;  Laterality: Right;   HOLMIUM LASER APPLICATION Bilateral 12/26/2023   Procedure: HOLMIUM LASER APPLICATION;  Surgeon: Sherrilee Belvie CROME, MD;  Location: [facility];  Service: Urology;  Laterality: Bilateral;   IR URETERAL STENT RIGHT NEW ACCESS W/O SEP NEPHROSTOMY CATH  05/22/2023   NEPHROLITHOTOMY Left 05/23/2023   Procedure: NEPHROLITHOTOMY PERCUTANEOUS;  Surgeon: Sherrilee Belvie CROME, MD;  Location: [facility];  Service: Urology;  Laterality: Left;   NEPHROLITHOTOMY Left 06/24/2023   Procedure: NEPHROLITHOTOMY PERCUTANEOUS- second look;  Surgeon: Sherrilee Belvie CROME, MD;  Location: [facility];  Service: Urology;  Laterality: Left;   TONSILLECTOMY     Family History  Problem Relation Age of Onset   Non-Hodgkin's lymphoma  Mother    Arthritis Mother    Cancer Mother    Heart disease Father    Arthritis Father    COPD Father    Hypertension Father    Diabetes Daughter    Rheum arthritis Daughter    Polycystic ovary syndrome Daughter    Breast cancer Neg Hx    Social History   Socioeconomic History   Marital status: Married    Spouse name: Adrienne Davidson   Number of children: 2   Years of education: 12   Highest education level: Associate degree: occupational, Scientist, product/process development, or vocational program  Occupational History   Occupation: paralegal    Comment: retired  Tobacco Use   Smoking status: Former    Current packs/day: 0.00    Average packs/day: 0.3 packs/day for 29.3 years (7.5 ttl pk-yrs)    Types: Cigarettes    Start date: 06/26/1993    Quit date: 06/27/2018    Years since quitting: 5.6   Smokeless tobacco: Never  Vaping Use    Vaping status: Never Used  Substance and Sexual Activity   Alcohol use: Not Currently    Comment: wine once per year   Drug use: Never   Sexual activity: Not Currently    Birth control/protection: Post-menopausal, None  Other Topics Concern   Not on file  Social History Narrative   Not on file   Social Drivers of Health   Financial Resource Strain: Medium Risk (01/31/2024)   Overall Financial Resource Strain (CARDIA)    Difficulty of Paying Living Expenses: Somewhat hard  Food Insecurity: No Food Insecurity (01/31/2024)   Hunger Vital Sign    Worried About Running Out of Food in the Last Year: Never true    Ran Out of Food in the Last Year: Never true  Transportation Needs: No Transportation Needs (01/31/2024)   PRAPARE - Administrator, Civil Service (Medical): No    Lack of Transportation (Non-Medical): No  Physical Activity: Inactive (01/31/2024)   Exercise Vital Sign    Days of Exercise per Week: 0 days    Minutes of Exercise per Session: 0 min  Stress: No Stress Concern Present (01/31/2024)   [employer] of Occupational Health - Occupational Stress Questionnaire    Feeling of Stress: Only a little  Social Connections: Moderately Integrated (01/31/2024)   Social Connection and Isolation Panel    Frequency of Communication with Friends and Family: More than three times a week    Frequency of Social Gatherings with Friends and Family: Once a week    Attends Religious Services: 1 to 4 times per year    Active Member of [employer] or Organizations: No    Attends Engineer, structural: Not on file    Marital Status: Married    Tobacco Counseling Counseling given: Yes    Clinical Intake:  Pre-visit preparation completed: Yes  Pain : No/denies pain     BMI - recorded: 29.08 Nutritional Status: BMI 25 -29 Overweight Nutritional Risks: None Diabetes: Yes  Lab Results  Component Value Date   HGBA1C 6.5 (H) 01/21/2024   HGBA1C 6.7 (H)  05/21/2023   HGBA1C 6.4 (H) 01/18/2023     How often do you need to have someone help you when you read instructions, pamphlets, or other written materials from your doctor or pharmacy?: 1 - Never  Interpreter Needed?: No  Information entered by :: alia t/cma   Activities of Daily Living     01/27/2024    9:04 AM 12/26/2023  6:14 AM  In your present state of health, do you have any difficulty performing the following activities:  Hearing? 1 0  Vision? 1 0  Difficulty concentrating or making decisions? 1 0  Walking or climbing stairs? 0   Dressing or bathing? 0   Doing errands, shopping? 0   Preparing Food and eating ? N   Using the Toilet? N   In the past six months, have you accidently leaked urine? Y   Managing your Medications? N   Managing your Finances? N   Housekeeping or managing your Housekeeping? Y     Patient Care Team: [name], DO as PCP - General (Family Medicine) Billee Mliss BIRCH, [facility] (Pharmacist) Shellia Oh, MD as Consulting Physician (Pulmonary Disease) Lomax, Laura, MD as Consulting Physician (Dermatology)  I have updated your Care Teams any recent Medical Services you may have received from other providers in the past year.     Assessment:   This is a routine wellness examination for Adrienne Davidson.  Hearing/Vision screen Hearing Screening - Comments:: Pt have a little dif w/hearing Vision Screening - Comments:: Pt wear glasses/pt goes [facility] upcoming apt   Goals Addressed   None    Depression Screen     01/31/2024   12:13 PM 01/21/2024    8:33 AM 01/31/2023   12:59 PM 01/18/2023    9:08 AM 09/21/2022    9:24 AM 07/19/2022   10:40 AM 03/05/2022    9:44 AM  PHQ 2/9 Scores  PHQ - 2 Score 4 2 0 0 0 0 2  PHQ- 9 Score 10 6  0 1  4    Fall Risk     01/31/2024   12:10 PM 01/27/2024    9:04 AM 07/17/2023    8:45 AM 01/18/2023    9:08 AM 09/21/2022    9:24 AM  Fall Risk   Falls in the past year? 0 0 0 0 0  Number falls  in past yr: 0  0 0 0  Injury with Fall? 0  0 0 0  Risk for fall due to : No Fall Risks  No Fall Risks No Fall Risks No Fall Risks  Follow up Falls evaluation completed  Falls evaluation completed Education provided Education provided    MEDICARE RISK AT HOME:  Medicare Risk at Home Any stairs in or around the home?: (Patient-Rptd) Yes If so, are there any without handrails?: (Patient-Rptd) No Home free of loose throw rugs in walkways, pet beds, electrical cords, etc?: (Patient-Rptd) Yes Adequate lighting in your home to reduce risk of falls?: (Patient-Rptd) Yes Life alert?: (Patient-Rptd) No Use of a cane, walker or w/c?: (Patient-Rptd) No Grab bars in the bathroom?: (Patient-Rptd) Yes Shower chair or bench in shower?: (Patient-Rptd) No Elevated toilet seat or a handicapped toilet?: (Patient-Rptd) No  TIMED UP AND GO:  Was the test performed?  no  Cognitive Function: 6CIT completed    01/21/2024    8:32 AM  MMSE - Mini Mental State Exam  Orientation to time 5  Orientation to Place 5  Registration 3  Attention/ Calculation 5  Recall 3  Language- name 2 objects 2  Language- repeat 1  Language- follow 3 step command 3  Language- read & follow direction 1  Write a sentence 1  Copy design 1  Total score 30        01/31/2024   12:12 PM 07/19/2022   10:41 AM 07/17/2021   10:40 AM 07/15/2020  8:28 AM 07/15/2019    8:08 AM  6CIT Screen  What Year? 0 points 0 points 0 points 0 points 0 points  What month? 0 points 0 points 0 points 0 points 0 points  What time? 0 points 0 points 0 points 0 points 0 points  Count back from 20 0 points 0 points 0 points 0 points 0 points  Months in reverse 0 points 0 points 0 points 0 points 0 points  Repeat phrase 0 points 0 points 0 points 0 points 0 points  Total Score 0 points 0 points 0 points 0 points 0 points    Immunizations Immunization History  Administered Date(s) Administered   Fluad Quad(high Dose 65+) 01/22/2019,  01/22/2020, 02/27/2021, 03/05/2022   Fluad Trivalent(High Dose 65+) 01/18/2023   INFLUENZA, HIGH DOSE SEASONAL PF 01/21/2024   Influenza Split 02/06/2017   Influenza,inj,Quad PF,6+ Mos 12/20/2017   Moderna Covid-19 Vaccine Bivalent Booster 2yrs & up 02/13/2021   Moderna SARS-COV2 Booster Vaccination 08/24/2020   Moderna Sars-Covid-2 Vaccination 05/20/2019, 06/17/2019, 03/02/2020   PNEUMOCOCCAL CONJUGATE-20 07/17/2023   Pneumococcal Conjugate-13 10/27/2018   Pneumococcal Polysaccharide-23 12/20/2015   Tdap 06/27/2021   Zoster Recombinant(Shingrix ) 10/27/2018, 01/01/2019    Screening Tests Health Maintenance  Topic Date Due   COVID-19 Vaccine (5 - 2025-26 season) 02/06/2024 (Originally 12/23/2023)   FOOT EXAM  07/16/2024   Mammogram  07/16/2024   HEMOGLOBIN A1C  07/20/2024   OPHTHALMOLOGY EXAM  11/11/2024   Diabetic kidney evaluation - eGFR measurement  01/20/2025   Diabetic kidney evaluation - Urine ACR  01/20/2025   Medicare Annual Wellness (AWV)  01/30/2025   DEXA SCAN  01/23/2026   Colonoscopy  05/30/2027   DTaP/Tdap/Td (2 - Td or Tdap) 06/28/2031   Pneumococcal Vaccine: 50+ Years  Completed   Influenza Vaccine  Completed   Hepatitis C Screening  Completed   Zoster Vaccines- Shingrix   Completed   Meningococcal B Vaccine  Aged Out    Health Maintenance Items Addressed: See Nurse Notes at the end of this note  Additional Screening:  Vision Screening: Recommended annual ophthalmology exams for early detection of glaucoma and other disorders of the eye. Is the patient up to date with their annual eye exam?  Yes  Who is the provider or what is the name of the office in which the patient attends annual eye exams? [facility]  Dental Screening: Recommended annual dental exams for proper oral hygiene  Community Resource Referral / Chronic Care Management: CRR required this visit?  No   CCM required this visit?  No   Plan:    I have personally reviewed and  noted the following in the patient's chart:   Medical and social history Use of alcohol, tobacco or illicit drugs  Current medications and supplements including opioid prescriptions. Patient is not currently taking opioid prescriptions. Functional ability and status Nutritional status Physical activity Advanced directives List of other physicians Hospitalizations, surgeries, and ER visits in previous 12 months Vitals Screenings to include cognitive, depression, and falls Referrals and appointments  In addition, I have reviewed and discussed with patient certain preventive protocols, quality metrics, and best practice recommendations. A written personalized care plan for preventive services as well as general preventive health recommendations were provided to patient.   Ozie Ned, CMA   01/31/2024   After Visit Summary: (MyChart) Due to this being a telephonic visit, the after visit summary with patients personalized plan was offered to patient via MyChart   Notes: Nothing significant  to report at this time.

## 2024-01-31 NOTE — Patient Instructions (Signed)
 Adrienne Davidson,  Thank you for taking the time for your Medicare Wellness Visit. I appreciate your continued commitment to your health goals. Please review the care plan we discussed, and feel free to reach out if I can assist you further.  Medicare recommends these wellness visits once per year to help you and your care team stay ahead of potential health issues. These visits are designed to focus on prevention, allowing your provider to concentrate on managing your acute and chronic conditions during your regular appointments.  Please note that Annual Wellness Visits do not include a physical exam. Some assessments may be limited, especially if the visit was conducted virtually. If needed, we may recommend a separate in-person follow-up with your provider.  Ongoing Care Seeing your primary care provider every 3 to 6 months helps us  monitor your health and provide consistent, personalized care.   Referrals If a referral was made during today's visit and you haven't received any updates within two weeks, please contact the referred provider directly to check on the status.  Recommended Screenings:  Health Maintenance  Topic Date Due   Medicare Annual Wellness Visit  07/19/2023   COVID-19 Vaccine (5 - 2025-26 season) 02/06/2024*   Complete foot exam   07/16/2024   Breast Cancer Screening  07/16/2024   Hemoglobin A1C  07/20/2024   Eye exam for diabetics  11/11/2024   Yearly kidney function blood test for diabetes  01/20/2025   Yearly kidney health urinalysis for diabetes  01/20/2025   DEXA scan (bone density measurement)  01/23/2026   Colon Cancer Screening  05/30/2027   DTaP/Tdap/Td vaccine (2 - Td or Tdap) 06/28/2031   Pneumococcal Vaccine for age over 85  Completed   Flu Shot  Completed   Hepatitis C Screening  Completed   Zoster (Shingles) Vaccine  Completed   Meningitis B Vaccine  Aged Out  *Topic was postponed. The date shown is not the original due date.       01/31/2024    12:12 PM  Advanced Directives  Does Patient Have a Medical Advance Directive? No   Advance Care Planning is important because it: Ensures you receive medical care that aligns with your values, goals, and preferences. Provides guidance to your family and loved ones, reducing the emotional burden of decision-making during critical moments.  Vision: Annual vision screenings are recommended for early detection of glaucoma, cataracts, and diabetic retinopathy. These exams can also reveal signs of chronic conditions such as diabetes and high blood pressure.  Dental: Annual dental screenings help detect early signs of oral cancer, gum disease, and other conditions linked to overall health, including heart disease and diabetes.  Please see the attached documents for additional preventive care recommendations.

## 2024-02-03 ENCOUNTER — Encounter: Payer: Self-pay | Admitting: Gastroenterology

## 2024-02-06 ENCOUNTER — Encounter (HOSPITAL_COMMUNITY)
Admission: RE | Admit: 2024-02-06 | Discharge: 2024-02-06 | Disposition: A | Discharge status: Home / Self Care | Source: Ambulatory Visit | Attending: Gastroenterology | Admitting: Gastroenterology

## 2024-02-06 DIAGNOSIS — R933 Abnormal findings on diagnostic imaging of other parts of digestive tract: Secondary | ICD-10-CM | POA: Insufficient documentation

## 2024-02-06 DIAGNOSIS — C7A8 Other malignant neuroendocrine tumors: Secondary | ICD-10-CM | POA: Diagnosis not present

## 2024-02-06 MED ORDER — COPPER CU 64 DOTATATE 1 MCI/ML IV SOLN
4.0000 | Freq: Once | INTRAVENOUS | Status: AC
  Administered 2024-02-06: 4.21 via INTRAVENOUS

## 2024-02-10 ENCOUNTER — Ambulatory Visit: Payer: Self-pay | Admitting: Gastroenterology

## 2024-02-13 ENCOUNTER — Encounter: Payer: Self-pay | Admitting: *Deleted

## 2024-02-13 NOTE — Progress Notes (Signed)
 Adrienne Davidson                                          MRN: [id number]   02/13/2024   The VBCI Quality Team Specialist reviewed this patient medical record for the purposes of chart review for care gap closure. The following were reviewed: abstraction for care gap closure-kidney health evaluation for diabetes:eGFR  and uACR.    VBCI Quality Team

## 2024-03-04 ENCOUNTER — Telehealth: Payer: Self-pay | Admitting: General Surgery

## 2024-03-04 ENCOUNTER — Other Ambulatory Visit: Payer: Self-pay | Admitting: *Deleted

## 2024-03-04 NOTE — Progress Notes (Signed)
 The proposed treatment discussed in conference is for discussion purpose only and is not a binding recommendation.  The patients have not been physically examined, or presented with their treatment options.  Therefore, final treatment plans cannot be decided.

## 2024-03-04 NOTE — Telephone Encounter (Signed)
 Discussed the findings of the tumor board discussion with the patient today.  She is currently asymptomatic and would like to wait on any surgical resection if not necessary.  She would like to f/u with oncology.  We will get this set up for her and plan of small bowel resection if she develops any obstructive symptoms.   Bernarda JAYSON Ned, MD  Colorectal and General Surgery [facility]

## 2024-03-06 ENCOUNTER — Other Ambulatory Visit: Payer: Self-pay

## 2024-03-06 DIAGNOSIS — K6389 Other specified diseases of intestine: Secondary | ICD-10-CM

## 2024-03-17 ENCOUNTER — Inpatient Hospital Stay

## 2024-03-17 ENCOUNTER — Inpatient Hospital Stay: Attending: Hematology | Admitting: Hematology

## 2024-03-17 ENCOUNTER — Encounter: Payer: Self-pay | Admitting: Hematology

## 2024-03-17 VITALS — BP 126/67 | HR 87 | Temp 98.0°F | Ht 62.0 in | Wt 156.5 lb

## 2024-03-17 DIAGNOSIS — E1140 Type 2 diabetes mellitus with diabetic neuropathy, unspecified: Secondary | ICD-10-CM | POA: Diagnosis not present

## 2024-03-17 DIAGNOSIS — C7B8 Other secondary neuroendocrine tumors: Secondary | ICD-10-CM | POA: Diagnosis not present

## 2024-03-17 DIAGNOSIS — Z807 Family history of other malignant neoplasms of lymphoid, hematopoietic and related tissues: Secondary | ICD-10-CM

## 2024-03-17 DIAGNOSIS — E1122 Type 2 diabetes mellitus with diabetic chronic kidney disease: Secondary | ICD-10-CM | POA: Diagnosis not present

## 2024-03-17 DIAGNOSIS — Z79899 Other long term (current) drug therapy: Secondary | ICD-10-CM

## 2024-03-17 DIAGNOSIS — Z87891 Personal history of nicotine dependence: Secondary | ICD-10-CM

## 2024-03-17 DIAGNOSIS — C7A8 Other malignant neuroendocrine tumors: Secondary | ICD-10-CM

## 2024-03-17 DIAGNOSIS — N1831 Chronic kidney disease, stage 3a: Secondary | ICD-10-CM

## 2024-03-17 LAB — CBC WITH DIFFERENTIAL/PLATELET
Abs Immature Granulocytes: 0.01 K/uL (ref 0.00–0.07)
Basophils Absolute: 0.1 K/uL (ref 0.0–0.1)
Basophils Relative: 1 %
Eosinophils Absolute: 0.2 K/uL (ref 0.0–0.5)
Eosinophils Relative: 2 %
HCT: 37.7 % (ref 36.0–46.0)
Hemoglobin: 12.4 g/dL (ref 12.0–15.0)
Immature Granulocytes: 0 %
Lymphocytes Relative: 32 %
Lymphs Abs: 2.3 K/uL (ref 0.7–4.0)
MCH: 25.8 pg — ABNORMAL LOW (ref 26.0–34.0)
MCHC: 32.9 g/dL (ref 30.0–36.0)
MCV: 78.4 fL — ABNORMAL LOW (ref 80.0–100.0)
Monocytes Absolute: 0.5 K/uL (ref 0.1–1.0)
Monocytes Relative: 7 %
Neutro Abs: 4.1 K/uL (ref 1.7–7.7)
Neutrophils Relative %: 58 %
Platelets: 371 K/uL (ref 150–400)
RBC: 4.81 MIL/uL (ref 3.87–5.11)
RDW: 15.5 % (ref 11.5–15.5)
WBC: 7.1 K/uL (ref 4.0–10.5)
nRBC: 0 % (ref 0.0–0.2)

## 2024-03-17 LAB — COMPREHENSIVE METABOLIC PANEL WITH GFR
ALT: 24 U/L (ref 0–44)
AST: 22 U/L (ref 15–41)
Albumin: 4.6 g/dL (ref 3.5–5.0)
Alkaline Phosphatase: 78 U/L (ref 38–126)
Anion gap: 13 (ref 5–15)
BUN: 28 mg/dL — ABNORMAL HIGH (ref 8–23)
CO2: 24 mmol/L (ref 22–32)
Calcium: 11 mg/dL — ABNORMAL HIGH (ref 8.9–10.3)
Chloride: 105 mmol/L (ref 98–111)
Creatinine, Ser: 1.21 mg/dL — ABNORMAL HIGH (ref 0.44–1.00)
GFR, Estimated: 48 mL/min — ABNORMAL LOW (ref >60)
Glucose, Bld: 168 mg/dL — ABNORMAL HIGH (ref 70–99)
Potassium: 4 mmol/L (ref 3.5–5.1)
Sodium: 142 mmol/L (ref 135–145)
Total Bilirubin: 0.3 mg/dL (ref 0.0–1.2)
Total Protein: 8.2 g/dL — ABNORMAL HIGH (ref 6.5–8.1)

## 2024-03-17 NOTE — Progress Notes (Unsigned)
 PATIENT NAVIGATOR PROGRESS NOTE  Name: CHARLAYNE VULTAGGIO Date: 03/17/2024 MRN: [id number]  DOB: [birth year]   Reason for visit:  Initial MedOnc Visit with Dr. Onita Mattock  Comments:   Patient was seen during her initial visit with Dr. Mattock.  Informed patient of my role in her care. Patient was given my direct contact information and was instructed to contact the office with any questions or concerns after today.     Time spent counseling/coordinating care: 30-45 minutes

## 2024-03-17 NOTE — Progress Notes (Signed)
 [facility]   [phone]) [phone] Fax:(336) [phone]   Clinic New Consult Note   Patient Care Team: [name], DO as PCP - General (Family Medicine) Billee Mliss BIRCH, [facility] (Pharmacist) Shellia Oh, MD as Consulting Physician (Pulmonary Disease) Cary Doffing, MD as Consulting Physician (Dermatology) Ardis Evalene CROME, RN as Oncology Nurse Navigator 03/17/2024  CHIEF COMPLAINTS/PURPOSE OF CONSULTATION:  Newly diagnosed neuroendocrine tumor  REFERRING PHYSICIAN: Dr. Bernarda Ned  Discussed the use of AI scribe software for clinical note transcription with the patient, who gave verbal consent to proceed.  History of Present Illness Adrienne Davidson is a 68 year old female with a newly diagnosed neuroendocrine tumor who presents for oncology consultation. She was referred by Dr. Ned for evaluation of her neuroendocrine tumor.  The neuroendocrine tumor was discovered incidentally during imaging for kidney stones. Evaluation for large kidney stones revealed spots in her small intestine, leading to the tumor diagnosis. Over the past two months she has had intermittent mild crampy abdominal twinges and a change in bowel habits with mostly soft stools and occasional loose stools in small clumps. She recently had four bowel movements in one day, which is unusual for her, and this intermittent pattern has been present for several months. She denies flushing or other new systemic symptoms aside from chronic back issues and neuropathy in her fingers.  She has recurrent kidney stones and underwent four treatments this year, including laser treatment and one lithotripsy, which were only partially successful due to very hard stones. She nearly developed kidney failure from obstructing stones and required surgery to remove them. She still has stones and is scheduled for an ultrasound.  She has type 2 diabetes with a recent A1c of 6.5. She had acute kidney failure in June  that improved but not to baseline, and her discharge paperwork indicated chronic kidney disease, possibly stage 3A.  She lost about eight pounds between May and June during her kidney stone illness, then regained three to four pounds. Her weight is currently stable between 152 and 160 pounds.  She has sleep apnea. She quit smoking three to four years ago.  Her mother had cancer, and her father may have had a tumor that was never confirmed.  She is married, has two children, and is retired. She manages most household responsibilities and her physical activity is limited by back problems and caring for her disabled husband.     MEDICAL HISTORY:  Past Medical History:  Diagnosis Date   Allergic rhinitis    Allergy    Anxiety    Arthritis    Chronic kidney disease    Kidney stones   Depression 2024   Diabetes mellitus (HCC)    Hyperlipidemia    Hypertension    Neuromuscular disorder (HCC)    Neuropathy in finger   Psoriasis    Bilateral feet   Sleep apnea    Sleep apnea    Vitamin D  deficiency     SURGICAL HISTORY: Past Surgical History:  Procedure Laterality Date   APPENDECTOMY     CESAREAN SECTION  1980   CESAREAN SECTION  1984   CYSTOSCOPY W/ URETERAL STENT PLACEMENT Bilateral 10/20/2023   Procedure: CYSTOSCOPY, WITH RETROGRADE PYELOGRAM AND URETERAL STENT INSERTION;  Surgeon: Alvaro Ricardo KATHEE Mickey., MD;  Location: [facility];  Service: Urology;  Laterality: Bilateral;   CYSTOSCOPY/RETROGRADE/URETEROSCOPY/STONE EXTRACTION WITH BASKET Right 11/14/2023   Procedure: CYSTOSCOPY, WITH CALCULUS REMOVAL USING BASKET;  Surgeon: Sherrilee Belvie CROME, MD;  Location: [facility];  Service: Urology;  Laterality: Right;   CYSTOSCOPY/RETROGRADE/URETEROSCOPY/STONE EXTRACTION WITH BASKET Bilateral 12/26/2023   Procedure: CYSTOSCOPY, WITH CALCULUS REMOVAL USING BASKET;  Surgeon: Sherrilee Belvie CROME, MD;  Location: [facility];  Service: Urology;  Laterality: Bilateral;   CYSTOSCOPY/URETEROSCOPY/HOLMIUM  LASER/STENT PLACEMENT Right 11/14/2023   Procedure: CYSTOSCOPY/URETEROSCOPY/HOLMIUM LASER/STENT PLACEMENT;  Surgeon: Sherrilee Belvie CROME, MD;  Location: [facility];  Service: Urology;  Laterality: Right;  pt to arrive at 8:30   CYSTOSCOPY/URETEROSCOPY/HOLMIUM LASER/STENT PLACEMENT Bilateral 12/26/2023   Procedure: CYSTOSCOPY/URETEROSCOPY/HOLMIUM LASER/STENT PLACEMENT;  Surgeon: Sherrilee Belvie CROME, MD;  Location: [facility];  Service: Urology;  Laterality: Bilateral;  stent exchange   EXTRACORPOREAL SHOCK WAVE LITHOTRIPSY Right 10/08/2023   Procedure: LITHOTRIPSY, ESWL;  Surgeon: Sherrilee Belvie CROME, MD;  Location: [facility];  Service: Urology;  Laterality: Right;   HOLMIUM LASER APPLICATION Right 11/14/2023   Procedure: HOLMIUM LASER APPLICATION;  Surgeon: Sherrilee Belvie CROME, MD;  Location: [facility];  Service: Urology;  Laterality: Right;   HOLMIUM LASER APPLICATION Bilateral 12/26/2023   Procedure: HOLMIUM LASER APPLICATION;  Surgeon: Sherrilee Belvie CROME, MD;  Location: [facility];  Service: Urology;  Laterality: Bilateral;   IR URETERAL STENT RIGHT NEW ACCESS W/O SEP NEPHROSTOMY CATH  05/22/2023   NEPHROLITHOTOMY Left 05/23/2023   Procedure: NEPHROLITHOTOMY PERCUTANEOUS;  Surgeon: Sherrilee Belvie CROME, MD;  Location: [facility];  Service: Urology;  Laterality: Left;   NEPHROLITHOTOMY Left 06/24/2023   Procedure: NEPHROLITHOTOMY PERCUTANEOUS- second look;  Surgeon: Sherrilee Belvie CROME, MD;  Location: [facility];  Service: Urology;  Laterality: Left;   TONSILLECTOMY      SOCIAL HISTORY: Social History   Socioeconomic History   Marital status: Married    Spouse name: Jerel   Number of children: 2   Years of education: 12   Highest education level: Associate degree: occupational, scientist, product/process development, or vocational program  Occupational History   Occupation: paralegal    Comment: retired  Tobacco Use   Smoking status: Former    Current packs/day: 0.00    Average packs/day: 0.3 packs/day for 29.3 years (7.5 ttl pk-yrs)    Types:  Cigarettes    Start date: 06/26/1993    Quit date: 06/27/2018    Years since quitting: 5.7   Smokeless tobacco: Never  Vaping Use   Vaping status: Never Used  Substance and Sexual Activity   Alcohol use: Not Currently    Comment: wine once per year   Drug use: Never   Sexual activity: Not Currently    Birth control/protection: Post-menopausal, None  Other Topics Concern   Not on file  Social History Narrative   Not on file   Social Drivers of Health   Financial Resource Strain: Medium Risk (01/31/2024)   Overall Financial Resource Strain (CARDIA)    Difficulty of Paying Living Expenses: Somewhat hard  Food Insecurity: No Food Insecurity (03/17/2024)   Hunger Vital Sign    Worried About Running Out of Food in the Last Year: Never true    Ran Out of Food in the Last Year: Never true  Transportation Needs: No Transportation Needs (03/17/2024)   PRAPARE - Administrator, Civil Service (Medical): No    Lack of Transportation (Non-Medical): No  Physical Activity: Inactive (01/31/2024)   Exercise Vital Sign    Days of Exercise per Week: 0 days    Minutes of Exercise per Session: 0 min  Stress: No Stress Concern Present (01/31/2024)   [employer] of Occupational Health - Occupational Stress Questionnaire    Feeling of Stress: Only a  little  Social Connections: Moderately Integrated (01/31/2024)   Social Connection and Isolation Panel    Frequency of Communication with Friends and Family: More than three times a week    Frequency of Social Gatherings with Friends and Family: Once a week    Attends Religious Services: 1 to 4 times per year    Active Member of [employer] or Organizations: No    Attends Engineer, Structural: Not on file    Marital Status: Married  Catering Manager Violence: Not At Risk (03/17/2024)   Humiliation, Afraid, Rape, and Kick questionnaire    Fear of Current or Ex-Partner: No    Emotionally Abused: No    Physically Abused: No     Sexually Abused: No    FAMILY HISTORY: Family History  Problem Relation Age of Onset   Non-Hodgkin's lymphoma Mother    Arthritis Mother    Cancer Mother        NHL   Heart disease Father    Arthritis Father    COPD Father    Hypertension Father    Diabetes Daughter    Rheum arthritis Daughter    Polycystic ovary syndrome Daughter    Breast cancer Neg Hx     ALLERGIES:  is allergic to enalapril.  MEDICATIONS:  Current Outpatient Medications  Medication Sig Dispense Refill   acetaminophen  (TYLENOL ) 500 MG tablet Take 500-1,000 mg by mouth every 6 (six) hours as needed (pain.).     amLODipine  (NORVASC ) 5 MG tablet Take 1 tablet (5 mg total) by mouth at bedtime. 100 tablet 3   Cholecalciferol (VITAMIN D3) 250 MCG (10000 UT) capsule Take 10,000 Units by mouth daily.     Coenzyme Q10 100 MG TABS Take 100 mg by mouth at bedtime.     CRANBERRY PO Take 25,200 mg by mouth at bedtime.     Dulaglutide  (TRULICITY ) 3 MG/0.5ML SOAJ Inject 3 mg as directed once a week. 6 mL 3   EPINEPHrine  0.3 mg/0.3 mL IJ SOAJ injection Inject 0.3 mg into the muscle as needed for anaphylaxis. Then call 911 2 each 0   FARXIGA  5 MG TABS tablet Take 1 tablet (5 mg total) by mouth daily. 90 tablet 1   glucose blood test strip Test blood sugar once daily (one touch verio if covered by ins/ otherwise whatever ins preference is) E11.65 100 each 12   Lancets (ONETOUCH ULTRASOFT) lancets Use once daily as instructed E11.65 (one touch verio) 100 each 12   LORazepam  (ATIVAN ) 0.5 MG tablet Take 0.5-1 tablets (0.25-0.5 mg total) by mouth 2 (two) times daily as needed for anxiety. 10 tablet 0   metFORMIN  (GLUCOPHAGE ) 1000 MG tablet Take 1 tablet (1,000 mg total) by mouth 2 (two) times daily with a meal. 200 tablet 3   Multiple Vitamin (MULTIVITAMIN WITH MINERALS) TABS tablet Take 1 tablet by mouth in the morning.     Omega-3 Fatty Acids (OMEGA-3 FISH OIL) 1200 MG CAPS Take 1,200 mg by mouth daily.     OVER THE COUNTER  MEDICATION Take 1 tablet by mouth at bedtime. Nervive supplement     rosuvastatin  (CRESTOR ) 10 MG tablet Take 1 tablet (10 mg total) by mouth daily. 100 tablet 3   sertraline  (ZOLOFT ) 100 MG tablet Take 1 tablet (100 mg total) by mouth daily. 100 tablet 3   No current facility-administered medications for this visit.    REVIEW OF SYSTEMS:   Constitutional: Denies fevers, chills or abnormal night sweats Eyes: Denies blurriness of vision, double  vision or watery eyes Ears, nose, mouth, throat, and face: Denies mucositis or sore throat Respiratory: Denies cough, dyspnea or wheezes Cardiovascular: Denies palpitation, chest discomfort or lower extremity swelling Gastrointestinal:  Denies nausea, heartburn or change in bowel habits Skin: Denies abnormal skin rashes Lymphatics: Denies new lymphadenopathy or easy bruising Neurological:Denies numbness, tingling or new weaknesses Behavioral/Psych: Mood is stable, no new changes  All other systems were reviewed with the patient and are negative.  PHYSICAL EXAMINATION: ECOG PERFORMANCE STATUS: 0 - Asymptomatic  Vitals:   03/17/24 1100 03/17/24 1153  BP: (!) 147/62 126/67  Pulse: 88 87  Temp: 98 F (36.7 C)   SpO2: 98% 97%   Filed Weights   03/17/24 1100  Weight: 156 lb 8 oz (71 kg)    GENERAL:alert, no distress and comfortable SKIN: skin color, texture, turgor are normal, no rashes or significant lesions EYES: normal, conjunctiva are pink and non-injected, sclera clear OROPHARYNX:no exudate, no erythema and lips, buccal mucosa, and tongue normal  NECK: supple, thyroid  normal size, non-tender, without nodularity LYMPH:  no palpable lymphadenopathy in the cervical, axillary or inguinal LUNGS: clear to auscultation and percussion with normal breathing effort HEART: regular rate & rhythm and no murmurs and no lower extremity edema ABDOMEN:abdomen soft, non-tender and normal bowel sounds Musculoskeletal:no cyanosis of digits and no  clubbing  PSYCH: alert & oriented x 3 with fluent speech NEURO: no focal motor/sensory deficits  Physical Exam    LABORATORY DATA:  I have reviewed the data as listed    Latest Ref Rng & Units 03/17/2024   12:49 PM 01/21/2024    8:37 AM 10/20/2023   11:10 AM  CBC  WBC 4.0 - 10.5 K/uL 7.1  6.3  11.5   Hemoglobin 12.0 - 15.0 g/dL 87.5  89.1  89.6   Hematocrit 36.0 - 46.0 % 37.7  36.0  30.8   Platelets 150 - 400 K/uL 371  271  179       Latest Ref Rng & Units 03/17/2024   12:49 PM 01/21/2024    8:37 AM 01/15/2024    3:28 PM  CMP  Glucose 70 - 99 mg/dL 831  881    BUN 8 - 23 mg/dL 28  23  25    Creatinine 0.44 - 1.00 mg/dL 8.78  8.72  8.74   Sodium 135 - 145 mmol/L 142  138    Potassium 3.5 - 5.1 mmol/L 4.0  4.8    Chloride 98 - 111 mmol/L 105  102    CO2 22 - 32 mmol/L 24  23    Calcium  8.9 - 10.3 mg/dL 88.9  89.7    Total Protein 6.5 - 8.1 g/dL 8.2  6.7    Total Bilirubin 0.0 - 1.2 mg/dL 0.3  0.3    Alkaline Phos 38 - 126 U/L 78  68    AST 15 - 41 U/L 22  16    ALT 0 - 44 U/L 24  14       RADIOGRAPHIC STUDIES: I have personally reviewed the radiological images as listed and agreed with the findings in the report. No results found.  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 The tumor is slow-growing but appears to be growing faster than typical, when compared to her last CT in 09/2023. -  pt has seen Dr. Debby. Surgery is challenging due to proximity to blood vessels, and radiation is not very effective. Biopsy is risky due to potential damage to bowel and blood vessels.  - I discussed her nonsurgical options, including Sandostatin injections, Lutasera treatment, or oral medicine such as everolimus and cabozantinib -She is not a  very symptomatic from her disease, okay to watch for now.  If she has more diarrhea or abdominal cramps, I will plan to start her on Sandostatin injection. -Also discussed the risk of bowel obstruction from the primary tumor, and what to watch. - Ordered chromogranin A tumor marker test. - Instructed on 24-hour urine collection for tumor marker monitoring.  Chronic kidney disease stage 3a Recent labs from September showed improvement but not resolution of kidney function issues. - Ordered baseline kidney function tests.  Plan - Lab and scan reviewed in person, this is consistent with small bowel neuroendocrine tumor with metastasis to mesentery node.  It is unfortunate not resectable. - I would consider Sandostatin injection if she has more diarrhea or abdominal cramps - Plan to repeat scan in 3 months for close follow-up, if she has disease progression, we will consider Lutathera treatment.   Orders Placed This Encounter  Procedures   CT ABDOMEN PELVIS W CONTRAST    Hold iv contrast if EGFR<50    Standing Status:   Future    Expected Date:   06/10/2024    Expiration Date:   03/17/2025    If indicated for the ordered procedure, I authorize the administration of contrast media per Radiology protocol:   Yes    Does the patient have a contrast media/X-ray dye allergy?:   No    Preferred imaging location?:   [facility]    Release to patient:   Immediate [1]    If indicated for the ordered procedure, I authorize the administration of oral contrast media per Radiology protocol:   Yes   CBC with Differential/Platelet    Standing Status:   Standing    Number of Occurrences:   50    Expiration Date:   03/17/2025   Chromogranin A    Standing Status:   Standing    Number of Occurrences:   20    Expiration Date:   03/17/2025   Comprehensive metabolic panel with GFR    Standing Status:   Standing    Number of Occurrences:   50    Expiration Date:   03/17/2025   5 HIAA,  quantitative, urine, 24 hour    Standing Status:   Future    Expected Date:   03/31/2024    Expiration Date:   03/17/2025    All questions were answered. The patient knows to call the clinic with any problems, questions or concerns. I spent 40 minutes counseling the patient face to face. The total time spent in the appointment was 50 minutes including review of chart and various tests results, discussions about plan of care and coordination of care plan.     Onita Mattock, MD 03/17/2024 5:23 PM

## 2024-03-19 LAB — CHROMOGRANIN A: Chromogranin A (ng/mL): 327.7 ng/mL — ABNORMAL HIGH (ref 0.0–101.8)

## 2024-03-23 ENCOUNTER — Other Ambulatory Visit: Payer: Self-pay | Admitting: Family Medicine

## 2024-03-23 ENCOUNTER — Inpatient Hospital Stay: Attending: Family Medicine

## 2024-03-23 DIAGNOSIS — C7A8 Other malignant neuroendocrine tumors: Secondary | ICD-10-CM | POA: Diagnosis present

## 2024-03-23 DIAGNOSIS — Z87898 Personal history of other specified conditions: Secondary | ICD-10-CM

## 2024-03-23 DIAGNOSIS — C7B8 Other secondary neuroendocrine tumors: Secondary | ICD-10-CM | POA: Diagnosis present

## 2024-03-24 ENCOUNTER — Other Ambulatory Visit: Payer: Self-pay

## 2024-03-24 DIAGNOSIS — C7A8 Other malignant neuroendocrine tumors: Secondary | ICD-10-CM

## 2024-03-26 LAB — 5 HIAA, QUANTITATIVE, URINE, 24 HOUR
5-HIAA, Ur: 7.3 mg/L
5-HIAA,Quant.,24 Hr Urine: 7.3 mg/(24.h) (ref 0.0–14.9)
Total Volume: 1000

## 2024-04-08 ENCOUNTER — Ambulatory Visit (HOSPITAL_COMMUNITY): Admission: RE | Admit: 2024-04-08 | Discharge: 2024-04-08 | Attending: Urology

## 2024-04-08 DIAGNOSIS — N200 Calculus of kidney: Secondary | ICD-10-CM | POA: Insufficient documentation

## 2024-04-16 ENCOUNTER — Other Ambulatory Visit: Payer: Self-pay | Admitting: Family Medicine

## 2024-04-16 DIAGNOSIS — E1129 Type 2 diabetes mellitus with other diabetic kidney complication: Secondary | ICD-10-CM

## 2024-04-22 ENCOUNTER — Ambulatory Visit: Admitting: Urology

## 2024-04-22 ENCOUNTER — Encounter: Payer: Self-pay | Admitting: Urology

## 2024-04-22 VITALS — BP 166/75 | HR 76

## 2024-04-22 DIAGNOSIS — N200 Calculus of kidney: Secondary | ICD-10-CM

## 2024-04-22 NOTE — Patient Instructions (Signed)

## 2024-04-22 NOTE — Progress Notes (Signed)
 "  04/22/2024 11:43 AM   Adrienne Davidson Jun 08, 1955 [id number]  Referring provider: [name], DO [address] [place],  [place] [address]  Followup nephrolithiasis   HPI: Adrienne Davidson is a 68yo here for followup for nephrolithiasis. NO stone events since last visit. She denies any flank pain. Renal US  shows left lower pole scar tissue and no calculi. She was recently diagnosed with 2 neuroendocrine tumors.    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   Psoriasis    Bilateral feet   Sleep apnea    Sleep apnea    Vitamin D  deficiency     Surgical History: Past Surgical History:  Procedure Laterality Date   APPENDECTOMY     CESAREAN SECTION  1980   CESAREAN SECTION  1984   CYSTOSCOPY W/ URETERAL STENT PLACEMENT Bilateral 10/20/2023   Procedure: CYSTOSCOPY, WITH RETROGRADE PYELOGRAM AND URETERAL STENT INSERTION;  Surgeon: Alvaro Ricardo KATHEE Mickey., MD;  Location: [facility];  Service: Urology;  Laterality: Bilateral;   CYSTOSCOPY/RETROGRADE/URETEROSCOPY/STONE EXTRACTION WITH BASKET Right 11/14/2023   Procedure: CYSTOSCOPY, WITH CALCULUS REMOVAL USING BASKET;  Surgeon: Sherrilee Belvie CROME, MD;  Location: [facility];  Service: Urology;  Laterality: Right;   CYSTOSCOPY/RETROGRADE/URETEROSCOPY/STONE EXTRACTION WITH BASKET Bilateral 12/26/2023   Procedure: CYSTOSCOPY, WITH CALCULUS REMOVAL USING BASKET;  Surgeon: Sherrilee Belvie CROME, MD;  Location: [facility];  Service: Urology;  Laterality: Bilateral;   CYSTOSCOPY/URETEROSCOPY/HOLMIUM LASER/STENT PLACEMENT Right 11/14/2023   Procedure: CYSTOSCOPY/URETEROSCOPY/HOLMIUM LASER/STENT PLACEMENT;  Surgeon: Sherrilee Belvie CROME, MD;  Location: [facility];  Service: Urology;  Laterality: Right;  pt to arrive at 8:30    CYSTOSCOPY/URETEROSCOPY/HOLMIUM LASER/STENT PLACEMENT Bilateral 12/26/2023   Procedure: CYSTOSCOPY/URETEROSCOPY/HOLMIUM LASER/STENT PLACEMENT;  Surgeon: Sherrilee Belvie CROME, MD;  Location: [facility];  Service: Urology;  Laterality: Bilateral;  stent exchange   EXTRACORPOREAL SHOCK WAVE LITHOTRIPSY Right 10/08/2023   Procedure: LITHOTRIPSY, ESWL;  Surgeon: Sherrilee Belvie CROME, MD;  Location: [facility];  Service: Urology;  Laterality: Right;   HOLMIUM LASER APPLICATION Right 11/14/2023   Procedure: HOLMIUM LASER APPLICATION;  Surgeon: Sherrilee Belvie CROME, MD;  Location: [facility];  Service: Urology;  Laterality: Right;   HOLMIUM LASER APPLICATION Bilateral 12/26/2023   Procedure: HOLMIUM LASER APPLICATION;  Surgeon: Sherrilee Belvie CROME, MD;  Location: [facility];  Service: Urology;  Laterality: Bilateral;   IR URETERAL STENT RIGHT NEW ACCESS W/O SEP NEPHROSTOMY CATH  05/22/2023   NEPHROLITHOTOMY Left 05/23/2023   Procedure: NEPHROLITHOTOMY PERCUTANEOUS;  Surgeon: Sherrilee Belvie CROME, MD;  Location: [facility];  Service: Urology;  Laterality: Left;   NEPHROLITHOTOMY Left 06/24/2023   Procedure: NEPHROLITHOTOMY PERCUTANEOUS- second look;  Surgeon: Sherrilee Belvie CROME, MD;  Location: [facility];  Service: Urology;  Laterality: Left;   TONSILLECTOMY      Home Medications:  Allergies as of 04/22/2024       Reactions   Enalapril Anaphylaxis   Angioedema Aug 2018        Medication List        Accurate as of April 22, 2024 11:43 AM. If you have any questions, ask your nurse or doctor.          acetaminophen  500 MG tablet Commonly known as: TYLENOL   Take 500-1,000 mg by mouth every 6 (six) hours as needed (pain.).   amLODipine  5 MG tablet Commonly known as: NORVASC  Take 1 tablet (5 mg total) by mouth at bedtime.   Coenzyme Q10 100 MG Tabs Take 100 mg by mouth at bedtime.   CRANBERRY PO Take 25,200 mg by mouth at bedtime.   EPINEPHrine  0.3 mg/0.3 mL Soaj injection Commonly known as: EPI-PEN Inject 0.3 mg  into the muscle as needed for anaphylaxis. Then call 911   Farxiga  5 MG Tabs tablet Generic drug: dapagliflozin  propanediol Take 1 tablet (5 mg total) by mouth daily.   glucose blood test strip Test blood sugar once daily (one touch verio if covered by ins/ otherwise whatever ins preference is) E11.65   LORazepam  0.5 MG tablet Commonly known as: ATIVAN  Take 0.5-1 tablets (0.25-0.5 mg total) by mouth 2 (two) times daily as needed for anxiety.   metFORMIN  1000 MG tablet Commonly known as: GLUCOPHAGE  Take 1 tablet (1,000 mg total) by mouth 2 (two) times daily with a meal.   multivitamin with minerals Tabs tablet Take 1 tablet by mouth in the morning.   Omega-3 Fish Oil 1200 MG Caps Take 1,200 mg by mouth daily.   onetouch ultrasoft lancets Use once daily as instructed E11.65 (one touch verio)   OVER THE COUNTER MEDICATION Take 1 tablet by mouth at bedtime. Nervive supplement   rosuvastatin  10 MG tablet Commonly known as: CRESTOR  Take 1 tablet (10 mg total) by mouth daily.   sertraline  100 MG tablet Commonly known as: ZOLOFT  Take 1 tablet (100 mg total) by mouth daily.   Trulicity  3 MG/0.5ML Soaj Generic drug: Dulaglutide  Inject 3 mg as directed once a week.   Vitamin D3 250 MCG (10000 UT) capsule Take 10,000 Units by mouth daily.        Allergies: Allergies[1]  Family History: Family History  Problem Relation Age of Onset   Non-Hodgkin's lymphoma Mother    Arthritis Mother    Cancer Mother        NHL   Heart disease Father    Arthritis Father    COPD Father    Hypertension Father    Diabetes Daughter    Rheum arthritis Daughter    Polycystic ovary syndrome Daughter    Breast cancer Neg Hx     Social History:  reports that she quit smoking about 5 years ago. Her smoking use included cigarettes. She started smoking about 30 years ago. She has a 7.5 pack-year smoking history. She has never used smokeless tobacco. She reports that she does not currently use  alcohol. She reports that she does not use drugs.  ROS: All other review of systems were reviewed and are negative except what is noted above in HPI  Physical Exam: BP (!) 166/75   Pulse 76   Constitutional:  Alert and oriented, No acute distress. HEENT: [place] AT, moist mucus membranes.  Trachea midline, no masses. Cardiovascular: No clubbing, cyanosis, or edema. Respiratory: Normal respiratory effort, no increased work of breathing. GI: Abdomen is soft, nontender, nondistended, no abdominal masses GU: No CVA tenderness.  Lymph: No cervical or inguinal lymphadenopathy. Skin: No rashes, bruises or suspicious lesions. Neurologic: Grossly intact, no focal deficits, moving all 4 extremities. Psychiatric: Normal mood and affect.  Laboratory Data: Lab Results  Component Value Date   WBC 7.1 03/17/2024   HGB 12.4 03/17/2024   HCT 37.7 03/17/2024   MCV 78.4 (L) 03/17/2024   PLT 371 03/17/2024    Lab Results  Component Value Date   CREATININE 1.21 (H) 03/17/2024    No results found for: PSA  No results found for: TESTOSTERONE  Lab Results  Component Value Date   HGBA1C 6.5 (H) 01/21/2024    Urinalysis    Component Value Date/Time   APPEARANCEUR Clear 01/08/2024 1015   GLUCOSEU 3+ (A) 01/08/2024 1015   BILIRUBINUR Negative 01/08/2024 1015   PROTEINUR Trace 01/08/2024 1015   NITRITE Negative 01/08/2024 1015   LEUKOCYTESUR 1+ (A) 01/08/2024 1015    Lab Results  Component Value Date   LABMICR 91.7 01/21/2024   WBCUA 11-30 (A) 01/08/2024   LABEPIT 0-10 01/08/2024   BACTERIA Few (A) 01/08/2024    Pertinent Imaging: Renal US  04/08/2024: Images reviewed and discussed with the patient  Results for orders placed during the hospital encounter of 10/08/23  DG Abd 1 View  Narrative CLINICAL DATA:  Nephrolithiasis  EXAM: ABDOMEN - 1 VIEW  COMPARISON:  Renal ultrasound 09/02/2023  FINDINGS: A cluster of calcifications in the left mid kidney measures proximally  1.4 by 1.1 cm. This likely represents at least two stones in close proximity.  In the right mid to lower kidney, a large 2.6 by 2.0 cm stone is observed. Just below this projecting over the right kidney lower pole is a 0.9 by 1.0 cm stone.  There is calcification projecting over the left sacroiliac joint and some hazy additional calcifications along the sacrum. On review of the CT scan from 05/28/2023, there is a 1.4 cm partially calcified central mesenteric lesion which could represent a partially calcified central mesenteric lymph node or an aneurysm of the ileocolic artery. There is also a 1.5 cm densely calcified lesion along the wall of adjacent small bowel on image 51 series 2. Small bowel carcinoid with adjacent calcified lymph node not excluded.  IMPRESSION: 1. Bilateral nephrolithiasis. 2. On review of the CT scan from 05/28/2023, there is a 1.4 cm partially calcified central mesenteric lesion which could represent a partially calcified central mesenteric lymph node or an aneurysm of the ileocolic artery. There is also a 1.5 cm densely calcified lesion along the wall of adjacent small bowel. Small bowel carcinoid with adjacent calcified lymph node not excluded. Consider gastroenterology referral for further workup.   Electronically Signed By: Ryan Salvage M.D. On: 10/08/2023 10:22  No results found for this or any previous visit.  No results found for this or any previous visit.  No results found for this or any previous visit.  Results for orders placed during the hospital encounter of 04/08/24  US  RENAL  Narrative CLINICAL DATA:  Follow-up examination for nephrolithiasis.  EXAM: RENAL / URINARY TRACT ULTRASOUND COMPLETE  COMPARISON:  Comparison made with prior ultrasound from 09/02/2023 and CT from 05/28/2023.  FINDINGS: Right Kidney:  Renal measurements: 10.8 x 5.6 x 5.7 cm = volume: 181.0 mL. There is apparent increased echogenicity within the  right renal parenchyma, possibly technical in nature. No visible nephrolithiasis. Mild pelviectasis without overt hydronephrosis. No focal renal mass.  Left Kidney:  Renal measurements: 10.9 x 5.6 x 4.3 cm = volume: 136.3 mL. Renal echogenicity within normal limits. 9 mm nonobstructive calculus present at the lower pole. Mild pelviectasis without overt hydronephrosis. No focal renal mass.  Bladder:  Appears normal for degree of bladder distention. Bilateral ureteral jets are visualized.  Other:  None.  IMPRESSION: 1. 9 mm nonobstructive calculus at the lower pole of the left kidney. 2. Mild bilateral pelviectasis without overt hydronephrosis. 3. Apparent increased echogenicity within the  right renal parenchyma, possibly technical in nature. Correlation with laboratory values and serum creatinine suggested.   Electronically Signed By: Morene Hoard M.D. On: 04/09/2024 06:09  No results found for this or any previous visit.  No results found for this or any previous visit.  Results for orders placed during the hospital encounter of 10/20/23  CT Renal Stone Study  Narrative CLINICAL DATA:  68 year old female with abdomen and flank pain. History of staghorn calculi, status post recent lithotripsy. Previous left side nephrostomy.  EXAM: CT ABDOMEN AND PELVIS WITHOUT CONTRAST  TECHNIQUE: Multidetector CT imaging of the abdomen and pelvis was performed following the standard protocol without IV contrast.  RADIATION DOSE REDUCTION: This exam was performed according to the departmental dose-optimization program which includes automated exposure control, adjustment of the mA and/or kV according to patient size and/or use of iterative reconstruction technique.  COMPARISON:  CT Abdomen and Pelvis 05/28/2023 and earlier.  FINDINGS: Lower chest: Streaky and platelike opacity now at both lung bases, more resembles atelectasis than infection. No pleural  effusion. Calcified coronary artery atherosclerosis. Calcified aortic atherosclerosis. No pericardial effusion or cardiomegaly.  Hepatobiliary: Stable and negative noncontrast appearance.  Pancreas: Negative.  Spleen: Negative.  Adrenals/Urinary Tract: Adrenal glands are stable and within normal limits. Left nephrostomy and ureteral stent have been removed since February. Regressed but not resolved left nephrolithiasis since that time, residual calcifications in the left kidney up to 8 mm. No left hydronephrosis or hydroureter.  Contralateral right nephromegaly, moderate to severe right hydronephrosis and right hydroureter. Regression of the previous staghorn calculus, but right lower pole confluent calcifications encompass up to 11 mm. Dilated and tortuous right ureter to the level of clustered calcifications at the pelvic inlet which are numerous. Confluent, up to 15 mm collection of calcifications as seen on coronal image 58. Distal to that additional extensive and confluent distal right ureter calcifications best seen on coronal image 63, more than 2 cm in length and about 6-7 mm diameter (series 2, image 66.  No calculi within the bladder which seems to remain normal.  Stomach/Bowel: Redundant sigmoid colon with diverticulosis. No dilated large or small bowel. Decompressed right colon. Cecum on a lax mesentery. Evidence of prior appendectomy.  However, chronic calcification of a lower abdominal small bowel loop which today is on coronal image 42, previously just to the right of midline, and appearance of that loop unchanged from December.  No bowel enlargement or regional inflammation there.  But furthermore, faint nodular calcification along the noncontrast SMA on series 2, image 55 stable.  No pneumoperitoneum. No free fluid. No peritoneal space inflammation is identified.  Vascular/Lymphatic: Chronic severe Aortoiliac calcified atherosclerosis. Vascular patency  is not evaluated in the absence of IV contrast.  Reproductive: Stable and negative noncontrast appearance.  Other: No pelvis free fluid.  Musculoskeletal: Advanced chronic lumbar spine degeneration is stable. No acute osseous abnormality identified.  IMPRESSION: 1. Positive for Moderate to Severe Obstructive Uropathy on the Right due to severe Steinstrasse in the right ureter at and below the pelvic inlet.  2. 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 Recommend follow-up outpatient CT Enterography to evaluate: Abnormal calcification of a distal small bowel loop in the lower abdomen (coronal image 42) and associated suspicious nodular calcification in the nearby small bowel mesentery (coronal image 29).  3. Regressed bilateral staghorn calculi since February but bulky intrarenal calcifications persist.   Electronically Signed By: VEAR Hurst M.D. On: 10/20/2023 12:08  Assessment & Plan:    1. Renal calculi (Primary) Followup 6 months with renal US    No follow-ups on file.  [name], MD  [facility] [place]       [1]  Allergies Allergen Reactions   Enalapril Anaphylaxis    Angioedema Aug 2018   "

## 2024-05-13 ENCOUNTER — Ambulatory Visit: Admitting: Family Medicine

## 2024-05-13 ENCOUNTER — Encounter: Payer: Self-pay | Admitting: Family Medicine

## 2024-05-13 ENCOUNTER — Ambulatory Visit: Payer: Self-pay | Admitting: Family Medicine

## 2024-05-13 VITALS — BP 133/79 | HR 67 | Temp 97.6°F | Ht 62.0 in | Wt 160.4 lb

## 2024-05-13 DIAGNOSIS — E1159 Type 2 diabetes mellitus with other circulatory complications: Secondary | ICD-10-CM

## 2024-05-13 DIAGNOSIS — R809 Proteinuria, unspecified: Secondary | ICD-10-CM

## 2024-05-13 DIAGNOSIS — I152 Hypertension secondary to endocrine disorders: Secondary | ICD-10-CM | POA: Diagnosis not present

## 2024-05-13 DIAGNOSIS — E1129 Type 2 diabetes mellitus with other diabetic kidney complication: Secondary | ICD-10-CM

## 2024-05-13 DIAGNOSIS — E785 Hyperlipidemia, unspecified: Secondary | ICD-10-CM | POA: Diagnosis not present

## 2024-05-13 DIAGNOSIS — C7A8 Other malignant neuroendocrine tumors: Secondary | ICD-10-CM

## 2024-05-13 DIAGNOSIS — E1169 Type 2 diabetes mellitus with other specified complication: Secondary | ICD-10-CM

## 2024-05-13 LAB — HGB A1C W/O EAG: Hgb A1c MFr Bld: 6.5 % — ABNORMAL HIGH (ref 4.8–5.6)

## 2024-05-13 NOTE — Progress Notes (Signed)
 "  Subjective: CC:DM PCP: [name], DO YEP:Adrienne Davidson is a 69 y.o. female presenting to clinic today for:  Type 2 Diabetes with hypertension, hyperlipidemia:  Patient reports compliance with all of her medications.  Reports no hypoglycemia, vaginitis etc.  Since her last visit she was diagnosed with a neuroendocrine tumor of the small intestine.  She is under the care of Dr. Lanny for this and will be seeing her back again in February to determine if any interventions will be planned.  They are considering injection therapy.  One of the 2 lesions is able to be resected and patient is considering having that done.  Repeat imaging will be required before they make a plan.  She reports no further changes in memory since her last visit.  Continues to have occasional lower abdominal cramping but reports normal bowel movements that are nonbloody.  Reports some fatigue.   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   Psoriasis    Bilateral feet   Sleep apnea    Sleep apnea    Vitamin D  deficiency    Current Medications[2] Social History   Socioeconomic History   Marital status: Married    Spouse name: Jerel   Number of children: 2   Years of education: 12   Highest education level: Associate degree: occupational, scientist, product/process development, or vocational program  Occupational History   Occupation: paralegal    Comment: retired  Tobacco Use   Smoking status: Former    Current packs/day: 0.00    Average packs/day: 0.3 packs/day for 29.3 years (7.5 ttl pk-yrs)    Types: Cigarettes    Start  date: 06/26/1993    Quit date: 06/27/2018    Years since quitting: 5.8   Smokeless tobacco: Never  Vaping Use   Vaping status: Never Used  Substance and Sexual Activity   Alcohol use: Not Currently    Comment: wine once per year   Drug use: Never   Sexual activity: Not Currently    Birth control/protection: Post-menopausal, None  Other Topics Concern   Not on file  Social History Narrative   Not on file   Social Drivers of Health   Tobacco Use: Medium Risk (04/22/2024)   Patient History    Smoking Tobacco Use: Former    Smokeless Tobacco Use: Never    Passive Exposure: Not on file  Financial Resource Strain: Medium Risk (01/31/2024)   Overall Financial Resource Strain (CARDIA)    Difficulty of Paying Living Expenses: Somewhat hard  Food Insecurity: No Food Insecurity (03/17/2024)   Epic    Worried About Radiation Protection Practitioner of Food in the Last Year: Never true    Ran Out of Food in the Last Year: Never true  Transportation Needs: No Transportation Needs (03/17/2024)   Epic    Lack of Transportation (Medical): No    Lack of Transportation (Non-Medical): No  Physical Activity: Inactive (01/31/2024)   Exercise Vital Sign    Days of Exercise per Week:  0 days    Minutes of Exercise per Session: 0 min  Stress: No Stress Concern Present (01/31/2024)   [employer] of Occupational Health - Occupational Stress Questionnaire    Feeling of Stress: Only a little  Social Connections: Moderately Integrated (01/31/2024)   Social Connection and Isolation Panel    Frequency of Communication with Friends and Family: More than three times a week    Frequency of Social Gatherings with Friends and Family: Once a week    Attends Religious Services: 1 to 4 times per year    Active Member of [employer] or Organizations: No    Attends Banker Meetings: Not on file    Marital Status: Married  Intimate Partner Violence: Not At Risk (03/17/2024)   Epic    Fear of Current or Ex-Partner: No     Emotionally Abused: No    Physically Abused: No    Sexually Abused: No  Depression (PHQ2-9): Low Risk (03/17/2024)   Depression (PHQ2-9)    PHQ-2 Score: 1  Recent Concern: Depression (PHQ2-9) - Medium Risk (01/31/2024)   Depression (PHQ2-9)    PHQ-2 Score: 10  Alcohol Screen: Low Risk (01/31/2024)   Alcohol Screen    Last Alcohol Screening Score (AUDIT): 0  Housing: Unknown (03/17/2024)   Epic    Unable to Pay for Housing in the Last Year: No    Number of Times Moved in the Last Year: Not on file    Homeless in the Last Year: No  Utilities: Not At Risk (03/17/2024)   Epic    Threatened with loss of utilities: No  Health Literacy: Adequate Health Literacy (01/31/2024)   B1300 Health Literacy    Frequency of need for help with medical instructions: Never   Family History  Problem Relation Age of Onset   Non-Hodgkin's lymphoma Mother    Arthritis Mother    Cancer Mother        NHL   Heart disease Father    Arthritis Father    COPD Father    Hypertension Father    Diabetes Daughter    Rheum arthritis Daughter    Polycystic ovary syndrome Daughter    Breast cancer Neg Hx     Objective: Office vital signs reviewed. BP 133/79   Pulse 67   Temp 97.6 F (36.4 C)   Ht 5' 2 (1.575 m)   Wt 160 lb 6 oz (72.7 kg)   SpO2 99%   BMI 29.33 kg/m   Physical Examination:  General: Awake, alert, nontoxic female, No acute distress HEENT: Sclera white.  Moist mucous membranes Cardio: regular rate and rhythm, S1S2 heard, no murmurs appreciated Pulm: clear to auscultation bilaterally, no wheezes, rhonchi or rales; normal work of breathing on room air   Lab Results  Component Value Date   HGBA1C 6.5 (H) 01/21/2024       05/13/2024    8:14 AM 03/17/2024   11:52 AM 03/17/2024   11:00 AM  Depression screen PHQ 2/9  Decreased Interest 1 0 0  Down, Depressed, Hopeless 0 1 0  PHQ - 2 Score 1 1 0  Altered sleeping 1    Tired, decreased energy 2    Change in appetite 1     Feeling bad or failure about yourself  0    Trouble concentrating 0    Moving slowly or fidgety/restless 0    Suicidal thoughts 0    PHQ-9 Score 5    Difficult doing work/chores Somewhat difficult  05/13/2024    8:14 AM 01/21/2024    8:34 AM 07/17/2023    8:44 AM 01/18/2023    9:09 AM  GAD 7 : Generalized Anxiety Score  Nervous, Anxious, on Edge 0 0  0  0   Control/stop worrying 0 0  0  0   Worry too much - different things 0 0  0  0   Trouble relaxing 0 0  0  0   Restless 0 0  0  0   Easily annoyed or irritable 1 1  0  0   Afraid - awful might happen 0 1  0  0   Total GAD 7 Score 1 2 0 0  Anxiety Difficulty Not difficult at all Somewhat difficult Not difficult at all      Data saved with a previous flowsheet row definition   Assessment/ Plan: 69 y.o. female   Type 2 diabetes mellitus with microalbuminuria, without long-term current use of insulin  (HCC) - Plan: Bayer DCA Hb A1c Waived  Hypertension associated with diabetes (HCC)  Hyperlipidemia associated with type 2 diabetes mellitus (HCC)  Primary malignant neuroendocrine tumor of small intestine (HCC)  Check A1c.  Blood pressure is controlled.  Not due for fasting lipid.  New diagnosis of a neuroendocrine tumor of the small intestine.  Interventions still pending.  I encouraged her to contact me should she have any concerns prior to her next visit.  Will certainly need to keep an eye on her mental health given this new diagnosis but at this time she seems to be holding together pretty good   Norene CHRISTELLA Fielding, DO [facility] [phone]     [1]  Allergies Allergen Reactions   Enalapril Anaphylaxis    Angioedema Aug 2018  [2]  Current Outpatient Medications:    acetaminophen  (TYLENOL ) 500 MG tablet, Take 500-1,000 mg by mouth every 6 (six) hours as needed (pain.)., Disp: , Rfl:    amLODipine  (NORVASC ) 5 MG tablet, Take 1 tablet (5 mg total) by mouth at bedtime., Disp: 100  tablet, Rfl: 3   Cholecalciferol (VITAMIN D3) 250 MCG (10000 UT) capsule, Take 10,000 Units by mouth daily., Disp: , Rfl:    Coenzyme Q10 100 MG TABS, Take 100 mg by mouth at bedtime., Disp: , Rfl:    CRANBERRY PO, Take 25,200 mg by mouth at bedtime., Disp: , Rfl:    Dulaglutide  (TRULICITY ) 3 MG/0.5ML SOAJ, Inject 3 mg as directed once a week., Disp: 6 mL, Rfl: 3   EPINEPHRINE  0.3 mg/0.3 mL IJ SOAJ injection, Inject 0.3 mg into the muscle as needed for anaphylaxis. Then call 911, Disp: 2 each, Rfl: 0   FARXIGA  5 MG TABS tablet, Take 1 tablet (5 mg total) by mouth daily., Disp: 90 tablet, Rfl: 1   glucose blood test strip, Test blood sugar once daily (one touch verio if covered by ins/ otherwise whatever ins preference is) E11.65, Disp: 100 each, Rfl: 12   Lancets (ONETOUCH ULTRASOFT) lancets, Use once daily as instructed E11.65 (one touch verio), Disp: 100 each, Rfl: 12   LORazepam  (ATIVAN ) 0.5 MG tablet, Take 0.5-1 tablets (0.25-0.5 mg total) by mouth 2 (two) times daily as needed for anxiety., Disp: 10 tablet, Rfl: 0   metFORMIN  (GLUCOPHAGE ) 1000 MG tablet, Take 1 tablet (1,000 mg total) by mouth 2 (two) times daily with a meal., Disp: 200 tablet, Rfl: 3   Multiple Vitamin (MULTIVITAMIN WITH MINERALS) TABS tablet, Take 1 tablet by mouth in the morning., Disp: ,  Rfl:    Omega-3 Fatty Acids (OMEGA-3 FISH OIL) 1200 MG CAPS, Take 1,200 mg by mouth daily., Disp: , Rfl:    OVER THE COUNTER MEDICATION, Take 1 tablet by mouth at bedtime. Nervive supplement, Disp: , Rfl:    rosuvastatin  (CRESTOR ) 10 MG tablet, Take 1 tablet (10 mg total) by mouth daily., Disp: 100 tablet, Rfl: 3   sertraline  (ZOLOFT ) 100 MG tablet, Take 1 tablet (100 mg total) by mouth daily., Disp: 100 tablet, Rfl: 3  "

## 2024-05-15 ENCOUNTER — Ambulatory Visit: Payer: Self-pay | Admitting: Family Medicine

## 2024-06-10 ENCOUNTER — Ambulatory Visit (HOSPITAL_COMMUNITY)

## 2024-06-18 ENCOUNTER — Inpatient Hospital Stay: Admitting: Hematology

## 2024-06-18 ENCOUNTER — Inpatient Hospital Stay: Attending: Hematology

## 2024-09-11 ENCOUNTER — Ambulatory Visit: Admitting: Family Medicine

## 2024-11-04 ENCOUNTER — Ambulatory Visit: Admitting: Urology

## 2025-01-25 ENCOUNTER — Encounter: Payer: Self-pay | Admitting: Family Medicine

## 2025-02-02 ENCOUNTER — Ambulatory Visit: Payer: Self-pay
